# Patient Record
Sex: Female | Born: 1980 | Race: White | Hispanic: No | Marital: Married | Smoking: Current every day smoker
Health system: Southern US, Community
[De-identification: ages and names within clinical notes are randomized; demographics above are authoritative.]

## PROBLEM LIST (undated history)

## (undated) ENCOUNTER — Inpatient Hospital Stay (HOSPITAL_COMMUNITY): Payer: Self-pay

## (undated) DIAGNOSIS — F32A Depression, unspecified: Secondary | ICD-10-CM

## (undated) DIAGNOSIS — G43909 Migraine, unspecified, not intractable, without status migrainosus: Secondary | ICD-10-CM

## (undated) DIAGNOSIS — M5430 Sciatica, unspecified side: Secondary | ICD-10-CM

## (undated) DIAGNOSIS — R200 Anesthesia of skin: Secondary | ICD-10-CM

## (undated) DIAGNOSIS — L989 Disorder of the skin and subcutaneous tissue, unspecified: Secondary | ICD-10-CM

## (undated) DIAGNOSIS — Z8489 Family history of other specified conditions: Secondary | ICD-10-CM

## (undated) DIAGNOSIS — N840 Polyp of corpus uteri: Secondary | ICD-10-CM

## (undated) DIAGNOSIS — K219 Gastro-esophageal reflux disease without esophagitis: Secondary | ICD-10-CM

## (undated) DIAGNOSIS — F329 Major depressive disorder, single episode, unspecified: Secondary | ICD-10-CM

## (undated) HISTORY — DX: Major depressive disorder, single episode, unspecified: F32.9

## (undated) HISTORY — DX: Depression, unspecified: F32.A

---
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
## 2002-01-24 ENCOUNTER — Encounter: Payer: Self-pay | Admitting: *Deleted

## 2002-01-24 ENCOUNTER — Emergency Department (HOSPITAL_COMMUNITY): Admission: EM | Admit: 2002-01-24 | Discharge: 2002-01-24 | Payer: Self-pay | Admitting: *Deleted

## 2002-10-22 ENCOUNTER — Emergency Department (HOSPITAL_COMMUNITY): Admission: EM | Admit: 2002-10-22 | Discharge: 2002-10-23 | Payer: Self-pay | Admitting: Emergency Medicine

## 2002-10-23 ENCOUNTER — Encounter: Payer: Self-pay | Admitting: Emergency Medicine

## 2002-12-23 ENCOUNTER — Inpatient Hospital Stay (HOSPITAL_COMMUNITY): Admission: AD | Admit: 2002-12-23 | Discharge: 2002-12-23 | Payer: Self-pay | Admitting: Obstetrics and Gynecology

## 2003-01-09 ENCOUNTER — Encounter: Admission: RE | Admit: 2003-01-09 | Discharge: 2003-01-09 | Payer: Self-pay | Admitting: Family Medicine

## 2003-04-30 ENCOUNTER — Emergency Department (HOSPITAL_COMMUNITY): Admission: EM | Admit: 2003-04-30 | Discharge: 2003-04-30 | Payer: Self-pay | Admitting: Family Medicine

## 2003-05-01 ENCOUNTER — Emergency Department (HOSPITAL_COMMUNITY): Admission: EM | Admit: 2003-05-01 | Discharge: 2003-05-02 | Payer: Self-pay | Admitting: Emergency Medicine

## 2003-06-10 ENCOUNTER — Encounter: Admission: RE | Admit: 2003-06-10 | Discharge: 2003-06-10 | Payer: Self-pay | Admitting: Obstetrics and Gynecology

## 2003-06-12 ENCOUNTER — Emergency Department (HOSPITAL_COMMUNITY): Admission: EM | Admit: 2003-06-12 | Discharge: 2003-06-12 | Payer: Self-pay | Admitting: Family Medicine

## 2003-07-21 ENCOUNTER — Inpatient Hospital Stay (HOSPITAL_COMMUNITY): Admission: AD | Admit: 2003-07-21 | Discharge: 2003-07-21 | Payer: Self-pay | Admitting: Obstetrics & Gynecology

## 2003-07-24 ENCOUNTER — Inpatient Hospital Stay (HOSPITAL_COMMUNITY): Admission: AD | Admit: 2003-07-24 | Discharge: 2003-07-25 | Payer: Self-pay | Admitting: Obstetrics & Gynecology

## 2003-07-29 ENCOUNTER — Encounter: Admission: RE | Admit: 2003-07-29 | Discharge: 2003-07-29 | Payer: Self-pay | Admitting: Obstetrics and Gynecology

## 2003-12-08 ENCOUNTER — Emergency Department (HOSPITAL_COMMUNITY): Admission: EM | Admit: 2003-12-08 | Discharge: 2003-12-08 | Payer: Self-pay | Admitting: Emergency Medicine

## 2003-12-30 ENCOUNTER — Emergency Department (HOSPITAL_COMMUNITY): Admission: EM | Admit: 2003-12-30 | Discharge: 2003-12-30 | Payer: Self-pay | Admitting: Emergency Medicine

## 2004-03-10 ENCOUNTER — Emergency Department (HOSPITAL_COMMUNITY): Admission: EM | Admit: 2004-03-10 | Discharge: 2004-03-11 | Payer: Self-pay | Admitting: Emergency Medicine

## 2004-04-05 ENCOUNTER — Inpatient Hospital Stay (HOSPITAL_COMMUNITY): Admission: AD | Admit: 2004-04-05 | Discharge: 2004-04-05 | Payer: Self-pay | Admitting: Obstetrics & Gynecology

## 2004-04-15 ENCOUNTER — Ambulatory Visit: Payer: Self-pay | Admitting: Obstetrics and Gynecology

## 2004-04-29 ENCOUNTER — Ambulatory Visit: Payer: Self-pay | Admitting: Family Medicine

## 2004-04-29 ENCOUNTER — Encounter: Payer: Self-pay | Admitting: Family Medicine

## 2004-09-21 ENCOUNTER — Emergency Department (HOSPITAL_COMMUNITY): Admission: EM | Admit: 2004-09-21 | Discharge: 2004-09-22 | Payer: Self-pay | Admitting: Emergency Medicine

## 2005-02-01 ENCOUNTER — Emergency Department (HOSPITAL_COMMUNITY): Admission: EM | Admit: 2005-02-01 | Discharge: 2005-02-01 | Payer: Self-pay | Admitting: Family Medicine

## 2005-02-03 ENCOUNTER — Ambulatory Visit: Payer: Self-pay | Admitting: Gastroenterology

## 2005-02-07 ENCOUNTER — Ambulatory Visit: Payer: Self-pay | Admitting: Gastroenterology

## 2005-03-01 ENCOUNTER — Emergency Department (HOSPITAL_COMMUNITY): Admission: EM | Admit: 2005-03-01 | Discharge: 2005-03-01 | Payer: Self-pay | Admitting: Family Medicine

## 2005-04-26 ENCOUNTER — Emergency Department (HOSPITAL_COMMUNITY): Admission: EM | Admit: 2005-04-26 | Discharge: 2005-04-26 | Payer: Self-pay | Admitting: Family Medicine

## 2005-04-27 ENCOUNTER — Ambulatory Visit: Payer: Self-pay | Admitting: Obstetrics and Gynecology

## 2005-04-27 ENCOUNTER — Encounter: Payer: Self-pay | Admitting: Obstetrics and Gynecology

## 2005-06-10 ENCOUNTER — Emergency Department (HOSPITAL_COMMUNITY): Admission: EM | Admit: 2005-06-10 | Discharge: 2005-06-10 | Payer: Self-pay | Admitting: Family Medicine

## 2005-06-30 ENCOUNTER — Inpatient Hospital Stay (HOSPITAL_COMMUNITY): Admission: AD | Admit: 2005-06-30 | Discharge: 2005-06-30 | Payer: Self-pay | Admitting: Obstetrics and Gynecology

## 2005-10-25 ENCOUNTER — Emergency Department (HOSPITAL_COMMUNITY): Admission: EM | Admit: 2005-10-25 | Discharge: 2005-10-25 | Payer: Self-pay | Admitting: Family Medicine

## 2005-11-23 ENCOUNTER — Emergency Department (HOSPITAL_COMMUNITY): Admission: EM | Admit: 2005-11-23 | Discharge: 2005-11-23 | Payer: Self-pay | Admitting: Emergency Medicine

## 2006-03-10 ENCOUNTER — Emergency Department (HOSPITAL_COMMUNITY): Admission: EM | Admit: 2006-03-10 | Discharge: 2006-03-10 | Payer: Self-pay | Admitting: Emergency Medicine

## 2006-07-21 ENCOUNTER — Ambulatory Visit (HOSPITAL_COMMUNITY): Admission: RE | Admit: 2006-07-21 | Discharge: 2006-07-21 | Payer: Self-pay | Admitting: Obstetrics & Gynecology

## 2006-08-09 ENCOUNTER — Ambulatory Visit: Payer: Self-pay | Admitting: *Deleted

## 2006-08-09 ENCOUNTER — Encounter: Payer: Self-pay | Admitting: Obstetrics and Gynecology

## 2006-08-14 ENCOUNTER — Emergency Department (HOSPITAL_COMMUNITY): Admission: EM | Admit: 2006-08-14 | Discharge: 2006-08-14 | Payer: Self-pay | Admitting: Emergency Medicine

## 2006-08-31 ENCOUNTER — Ambulatory Visit: Payer: Self-pay | Admitting: Gynecology

## 2006-09-02 ENCOUNTER — Inpatient Hospital Stay (HOSPITAL_COMMUNITY): Admission: AD | Admit: 2006-09-02 | Discharge: 2006-09-02 | Payer: Self-pay | Admitting: Gynecology

## 2006-09-06 ENCOUNTER — Ambulatory Visit: Payer: Self-pay | Admitting: Gynecology

## 2007-01-13 ENCOUNTER — Emergency Department (HOSPITAL_COMMUNITY): Admission: EM | Admit: 2007-01-13 | Discharge: 2007-01-13 | Payer: Self-pay | Admitting: Family Medicine

## 2007-03-07 ENCOUNTER — Emergency Department (HOSPITAL_COMMUNITY): Admission: EM | Admit: 2007-03-07 | Discharge: 2007-03-07 | Payer: Self-pay | Admitting: Emergency Medicine

## 2007-05-16 ENCOUNTER — Ambulatory Visit: Payer: Self-pay | Admitting: Obstetrics & Gynecology

## 2007-05-18 ENCOUNTER — Ambulatory Visit (HOSPITAL_COMMUNITY): Admission: RE | Admit: 2007-05-18 | Discharge: 2007-05-18 | Payer: Self-pay | Admitting: Family Medicine

## 2007-08-01 ENCOUNTER — Inpatient Hospital Stay (HOSPITAL_COMMUNITY): Admission: AD | Admit: 2007-08-01 | Discharge: 2007-08-01 | Payer: Self-pay | Admitting: Obstetrics and Gynecology

## 2007-08-06 ENCOUNTER — Inpatient Hospital Stay (HOSPITAL_COMMUNITY): Admission: RE | Admit: 2007-08-06 | Discharge: 2007-08-08 | Payer: Self-pay | Admitting: Obstetrics & Gynecology

## 2007-08-06 ENCOUNTER — Encounter: Payer: Self-pay | Admitting: Obstetrics & Gynecology

## 2007-08-06 ENCOUNTER — Ambulatory Visit: Payer: Self-pay | Admitting: Obstetrics & Gynecology

## 2007-08-06 HISTORY — PX: MYOMECTOMY ABDOMINAL APPROACH: SUR870

## 2007-08-29 ENCOUNTER — Ambulatory Visit: Payer: Self-pay | Admitting: Obstetrics & Gynecology

## 2007-09-18 ENCOUNTER — Emergency Department (HOSPITAL_COMMUNITY): Admission: EM | Admit: 2007-09-18 | Discharge: 2007-09-18 | Payer: Self-pay | Admitting: Emergency Medicine

## 2007-10-22 ENCOUNTER — Emergency Department (HOSPITAL_BASED_OUTPATIENT_CLINIC_OR_DEPARTMENT_OTHER): Admission: EM | Admit: 2007-10-22 | Discharge: 2007-10-22 | Payer: Self-pay | Admitting: Emergency Medicine

## 2007-12-03 ENCOUNTER — Emergency Department (HOSPITAL_BASED_OUTPATIENT_CLINIC_OR_DEPARTMENT_OTHER): Admission: EM | Admit: 2007-12-03 | Discharge: 2007-12-03 | Payer: Self-pay | Admitting: Emergency Medicine

## 2008-01-17 ENCOUNTER — Inpatient Hospital Stay (HOSPITAL_COMMUNITY): Admission: AD | Admit: 2008-01-17 | Discharge: 2008-01-17 | Payer: Self-pay | Admitting: Family Medicine

## 2008-03-11 ENCOUNTER — Emergency Department (HOSPITAL_COMMUNITY): Admission: EM | Admit: 2008-03-11 | Discharge: 2008-03-11 | Payer: Self-pay | Admitting: Family Medicine

## 2008-05-05 ENCOUNTER — Emergency Department (HOSPITAL_COMMUNITY): Admission: EM | Admit: 2008-05-05 | Discharge: 2008-05-05 | Payer: Self-pay | Admitting: Family Medicine

## 2008-06-30 ENCOUNTER — Emergency Department (HOSPITAL_COMMUNITY): Admission: EM | Admit: 2008-06-30 | Discharge: 2008-06-30 | Payer: Self-pay | Admitting: Emergency Medicine

## 2008-08-20 ENCOUNTER — Encounter: Payer: Self-pay | Admitting: Obstetrics & Gynecology

## 2008-08-20 ENCOUNTER — Ambulatory Visit: Payer: Self-pay | Admitting: Obstetrics & Gynecology

## 2008-11-28 ENCOUNTER — Emergency Department (HOSPITAL_COMMUNITY): Admission: EM | Admit: 2008-11-28 | Discharge: 2008-11-29 | Payer: Self-pay | Admitting: Emergency Medicine

## 2009-09-23 ENCOUNTER — Inpatient Hospital Stay (HOSPITAL_COMMUNITY): Admission: AD | Admit: 2009-09-23 | Discharge: 2009-09-23 | Payer: Self-pay | Admitting: Obstetrics & Gynecology

## 2009-10-07 ENCOUNTER — Ambulatory Visit: Payer: Self-pay | Admitting: Obstetrics and Gynecology

## 2009-10-23 ENCOUNTER — Ambulatory Visit: Payer: Self-pay | Admitting: Family Medicine

## 2009-12-11 ENCOUNTER — Ambulatory Visit: Payer: Self-pay | Admitting: Obstetrics & Gynecology

## 2010-01-12 IMAGING — CR DG CHEST 2V
2 series · 2 of 2 positions shown · non-contrast
Comparison: Chest x-ray of 12/30/2003

CLINICAL DATA: Mid chest pain, nausea and vomiting

CHEST - 2 VIEW

[w chest pa]
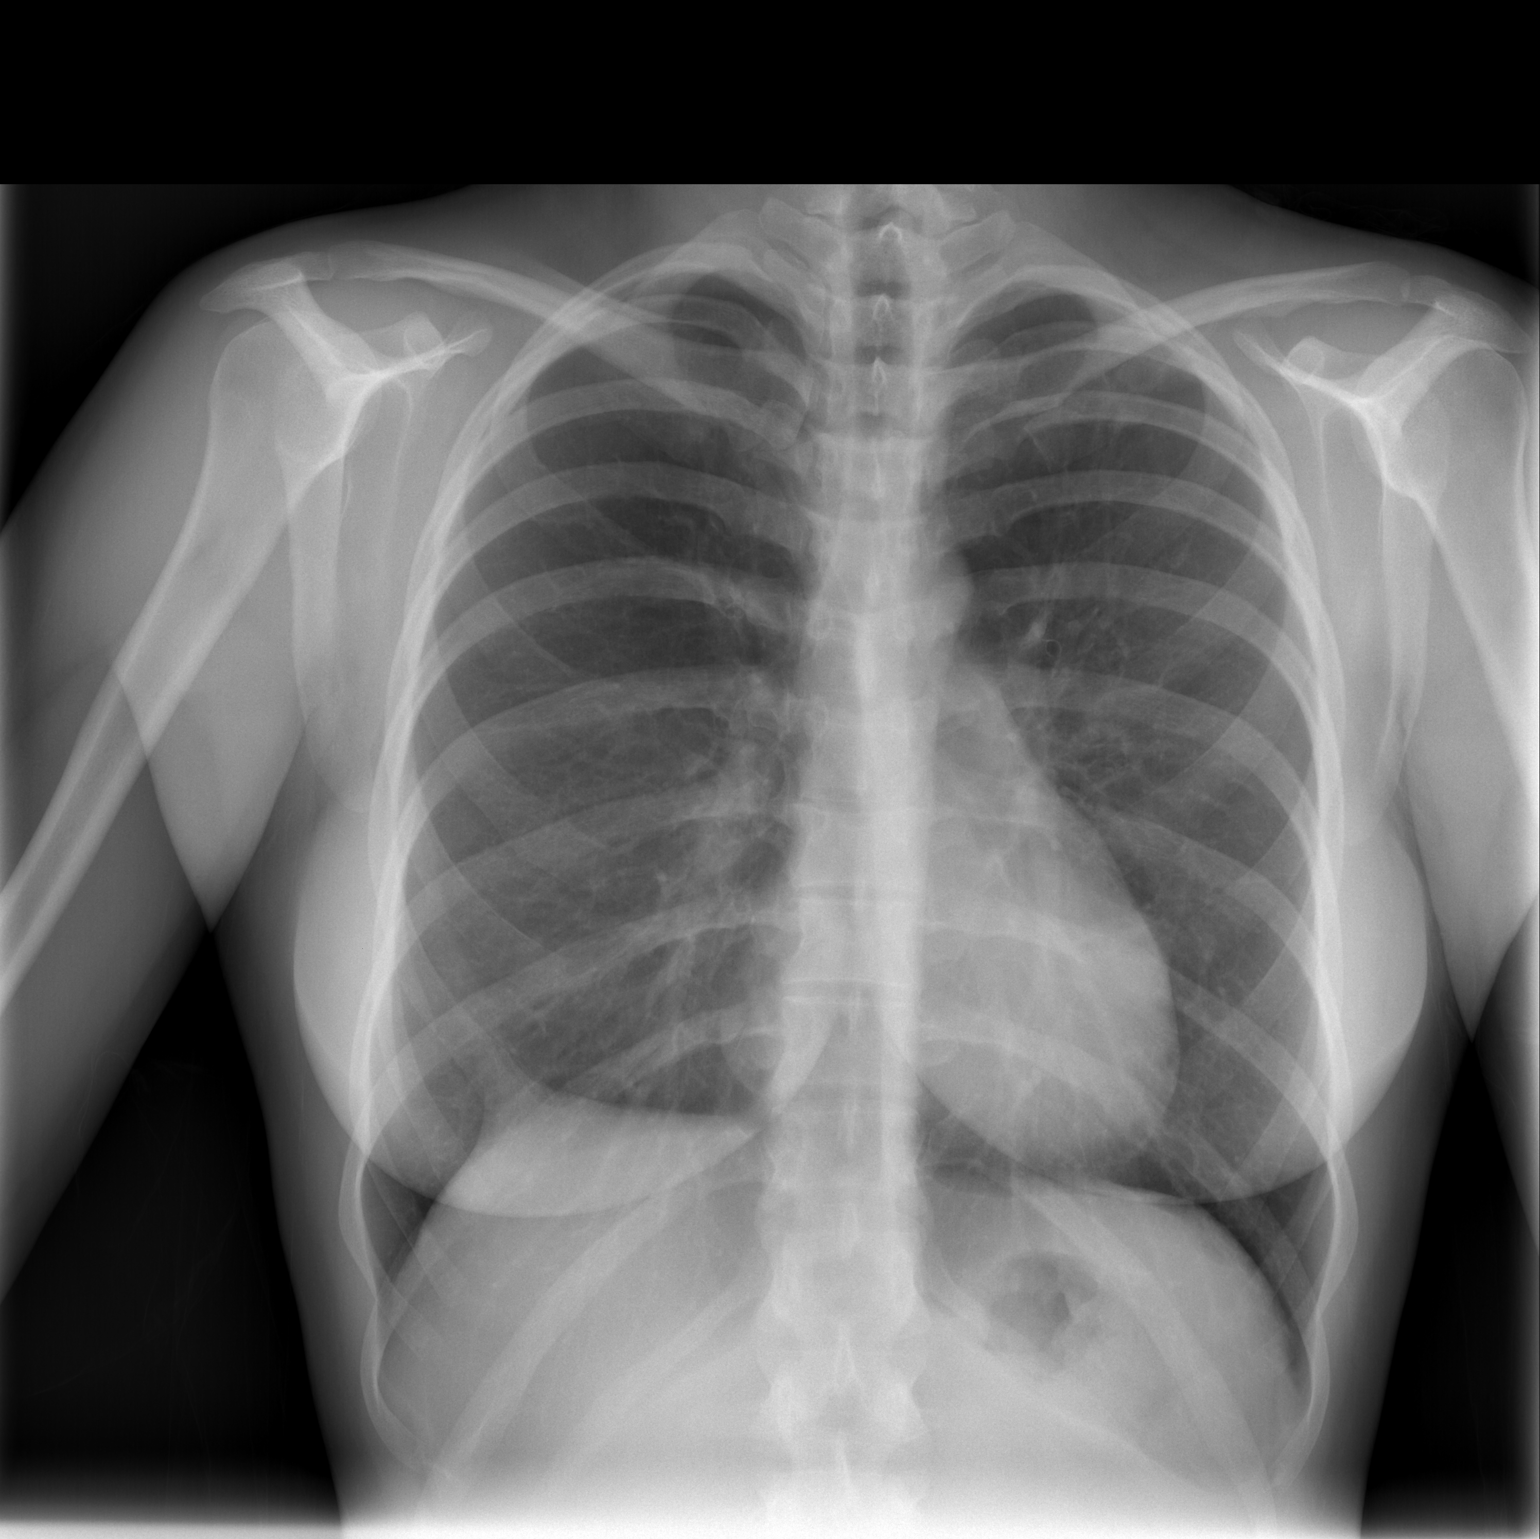

[w chest lat]
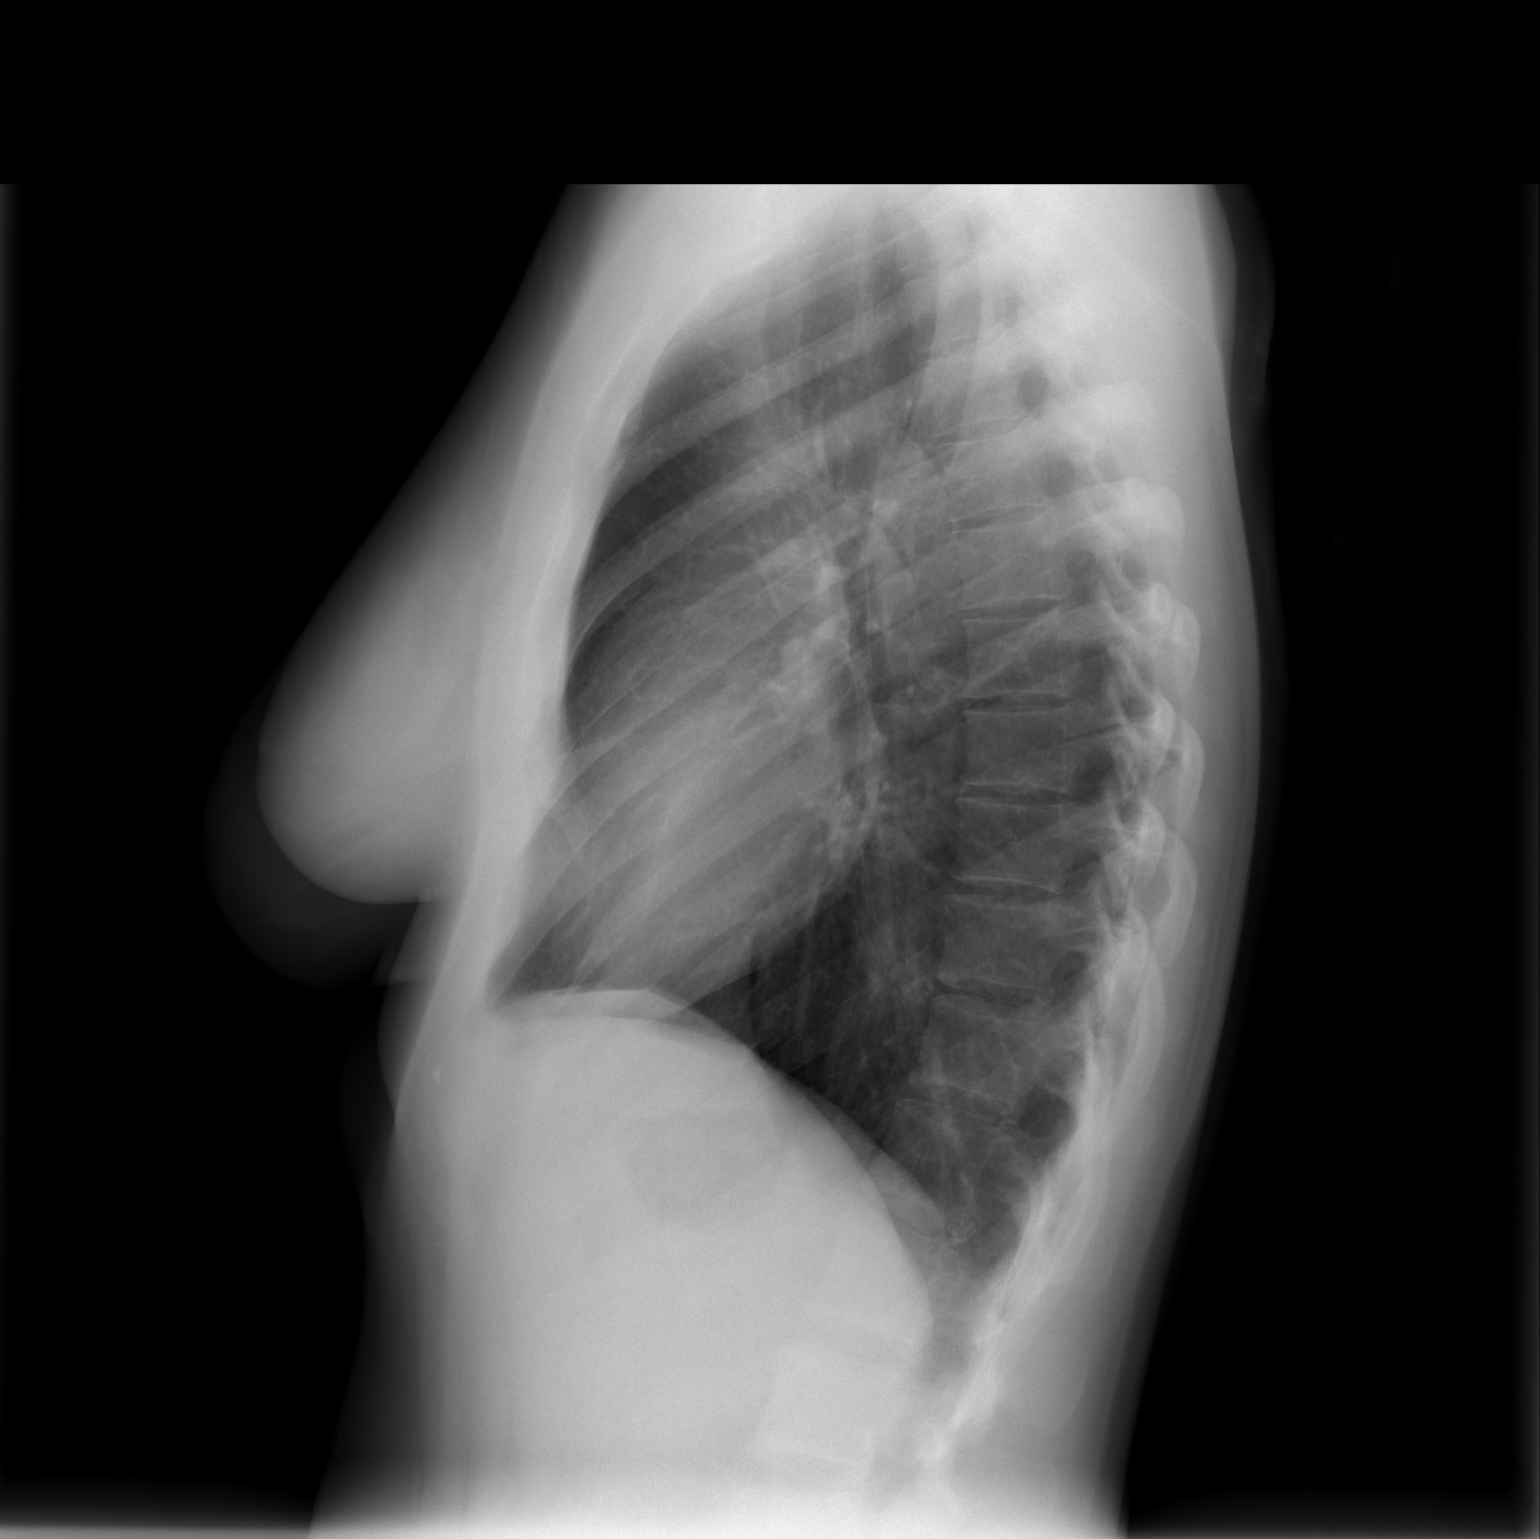

[2 of 2 positions shown; findings below may reference images not displayed]

FINDINGS: The lungs are clear.  There does appear to be mild
scarring at the right lung base with pleural - diaphragmatic
tenting. The heart is within normal limits in size. No bony
abnormality is seen.
IMPRESSION: No active lung disease.

## 2010-01-27 ENCOUNTER — Ambulatory Visit
Admission: RE | Admit: 2010-01-27 | Discharge: 2010-01-27 | Payer: Self-pay | Source: Home / Self Care | Attending: Obstetrics and Gynecology | Admitting: Obstetrics and Gynecology

## 2010-03-03 NOTE — Group Therapy Note (Signed)
NAME:  Donna Gordon, Donna Gordon NO.:  [id number]  MEDICAL RECORD NO.:  [id number]           PATIENT TYPE:  LOCATION:  [facility]                     FACILITY:  PHYSICIAN:  Argentina Donovan, MD        DATE OF BIRTH:  [birth year]  DATE OF SERVICE:  01/27/2010                                 CLINIC NOTE  The patient is a 30 year old nulligravida with a Mirena IUD placed on October 23, 2009, for severe dysmenorrhea.  She had tried everything before that and had problems.  Now that seems to be working, she is very comfortable from that point of view, but is having problems with weight gain.  She has gained 5 pounds, which is a lot for this young woman 5 feet 6 inches and weighs 142.6 pounds and recently, she feels breast engorgement and bloating.  I have told her to ride it out another of couple months and see whether or not that clears up.  If it does not, I give her spirolactone 50 mg b.i.d. to use 10 days at a time, 10 days on and 10 days off so that potassium does not get up too high.  In addition to this, no matter what she eats, she gets bloating immediately and feels like she is pregnant, looks like she is pregnant grandmother says. She most probably has IBS symptoms and I am going to try her on Amitiza at 8 mg b.i.d. and see if that does help.  She has no insurance, I gave her some samples of that as she falls into a category that is very difficult to be able to tolerate.  In any case, she seems to be doing fairly well otherwise.  She was having real mood swings since she has been on the IUD but some doctor gave her Prozac and that seems to have leveled her out considerably.  IMPRESSION:  Probable irritable bowel syndrome, weight gain, and engorgement secondary to Mirena intrauterine device.          ______________________________ Argentina Donovan, MD    PR/MEDQ  D:  01/27/2010  T:  01/28/2010  Job:  [id number]

## 2010-03-24 LAB — POCT PREGNANCY, URINE: Preg Test, Ur: NEGATIVE

## 2010-03-25 LAB — URINALYSIS, ROUTINE W REFLEX MICROSCOPIC
Bilirubin Urine: NEGATIVE
Glucose, UA: NEGATIVE mg/dL
Ketones, ur: NEGATIVE mg/dL
Leukocytes, UA: NEGATIVE
Nitrite: NEGATIVE
Protein, ur: NEGATIVE mg/dL
Specific Gravity, Urine: 1.02 (ref 1.005–1.030)
Urobilinogen, UA: 0.2 mg/dL (ref 0.0–1.0)
pH: 6 (ref 5.0–8.0)

## 2010-03-25 LAB — WET PREP, GENITAL
Clue Cells Wet Prep HPF POC: NONE SEEN
Trich, Wet Prep: NONE SEEN
Yeast Wet Prep HPF POC: NONE SEEN

## 2010-03-25 LAB — DIFFERENTIAL
Basophils Absolute: 0 10*3/uL (ref 0.0–0.1)
Basophils Relative: 0 % (ref 0–1)
Eosinophils Absolute: 0.1 10*3/uL (ref 0.0–0.7)
Eosinophils Relative: 1 % (ref 0–5)
Lymphocytes Relative: 26 % (ref 12–46)
Lymphs Abs: 2.5 10*3/uL (ref 0.7–4.0)
Monocytes Absolute: 0.6 10*3/uL (ref 0.1–1.0)
Monocytes Relative: 6 % (ref 3–12)
Neutro Abs: 6.5 10*3/uL (ref 1.7–7.7)
Neutrophils Relative %: 67 % (ref 43–77)

## 2010-03-25 LAB — GC/CHLAMYDIA PROBE AMP, GENITAL
Chlamydia, DNA Probe: NEGATIVE
GC Probe Amp, Genital: NEGATIVE

## 2010-03-25 LAB — URINE MICROSCOPIC-ADD ON

## 2010-03-25 LAB — CBC
HCT: 37.5 % (ref 36.0–46.0)
Hemoglobin: 12.6 g/dL (ref 12.0–15.0)
MCH: 31.5 pg (ref 26.0–34.0)
MCHC: 33.5 g/dL (ref 30.0–36.0)
MCV: 93.9 fL (ref 78.0–100.0)
Platelets: 237 10*3/uL (ref 150–400)
RBC: 4 MIL/uL (ref 3.87–5.11)
RDW: 12.8 % (ref 11.5–15.5)
WBC: 9.7 10*3/uL (ref 4.0–10.5)

## 2010-03-25 LAB — POCT PREGNANCY, URINE: Preg Test, Ur: NEGATIVE

## 2010-03-29 ENCOUNTER — Ambulatory Visit (INDEPENDENT_AMBULATORY_CARE_PROVIDER_SITE_OTHER): Payer: Self-pay | Admitting: Obstetrics & Gynecology

## 2010-03-29 DIAGNOSIS — K589 Irritable bowel syndrome without diarrhea: Secondary | ICD-10-CM

## 2010-03-29 DIAGNOSIS — Z30431 Encounter for routine checking of intrauterine contraceptive device: Secondary | ICD-10-CM

## 2010-04-02 NOTE — Progress Notes (Signed)
NAME:  KENNY, STERN NO.:  [id number]  MEDICAL RECORD NO.:  [id number]           PATIENT TYPE:  A  LOCATION:  [facility]                   FACILITY:  [facility]  PHYSICIAN:  Jaynie Collins, MD     DATE OF BIRTH:  1980-10-26  DATE OF SERVICE:                                 CLINIC NOTE  The patient is a 30 year old nulligravida with a Mirena IUD that was placed in October 23, 2009, that was seen by Dr. Okey Dupre in January for a complaint of having weight gain.  At that point, she weighed 142 pounds. She also reported having breast engorgement, which she was told was normal expected side effects.  At the same time, she reports having irritable bowel syndrome 10 times and was given a sample of Amitiza 8 mcg twice a day.  She does say that this helped tremendously and she wanted to know if there were more samples to be given to her to help her symptoms.  As for her IUD, she denies any other concerns at this point. Today's the patient weighs 145 pounds.  She is okay with this.  She was told that the progesterone could increase her appetite and so she should be cognizant of this and watch what she is eating and also exercise more if she wants to avoid any further weight gain.  As for the Amitiza, no further samples are available at this point.  A prescription of it was offered to her and also referral for a gastroenterologist but she declined this at this point.  She was told if her symptoms worsen that she should let us know, we will initiate a referral for a gastroenterologist.  The patient's last Pap smear was in September 2011 and she will come back then for her next evaluation.          ______________________________ Jaynie Collins, MD    UA/MEDQ  D:  03/29/2010  T:  03/30/2010  Job:  [id number]

## 2010-04-14 LAB — URINALYSIS, ROUTINE W REFLEX MICROSCOPIC
Glucose, UA: NEGATIVE mg/dL
Ketones, ur: NEGATIVE mg/dL
Leukocytes, UA: NEGATIVE
pH: 5.5 (ref 5.0–8.0)

## 2010-04-14 LAB — CBC
HCT: 36.4 % (ref 36.0–46.0)
Platelets: 258 10*3/uL (ref 150–400)
RBC: 4.01 MIL/uL (ref 3.87–5.11)
WBC: 9.5 10*3/uL (ref 4.0–10.5)

## 2010-04-14 LAB — POCT PREGNANCY, URINE: Preg Test, Ur: NEGATIVE

## 2010-04-14 LAB — DIFFERENTIAL
Eosinophils Relative: 1 % (ref 0–5)
Lymphocytes Relative: 33 % (ref 12–46)
Lymphs Abs: 3.1 10*3/uL (ref 0.7–4.0)
Monocytes Relative: 6 % (ref 3–12)

## 2010-04-14 LAB — URINE MICROSCOPIC-ADD ON

## 2010-04-19 LAB — RAPID STREP SCREEN (MED CTR MEBANE ONLY): Streptococcus, Group A Screen (Direct): NEGATIVE

## 2010-04-22 LAB — POCT I-STAT, CHEM 8
Calcium, Ion: 1.24 mmol/L (ref 1.12–1.32)
Chloride: 104 mEq/L (ref 96–112)
HCT: 45 % (ref 36.0–46.0)
Potassium: 3.5 mEq/L (ref 3.5–5.1)

## 2010-04-22 LAB — CBC
MCHC: 34.4 g/dL (ref 30.0–36.0)
RDW: 13.4 % (ref 11.5–15.5)

## 2010-04-22 LAB — POCT URINALYSIS DIP (DEVICE)
Bilirubin Urine: NEGATIVE
Glucose, UA: NEGATIVE mg/dL
Nitrite: NEGATIVE
pH: 7 (ref 5.0–8.0)

## 2010-04-22 LAB — DIFFERENTIAL
Basophils Absolute: 0 10*3/uL (ref 0.0–0.1)
Basophils Relative: 0 % (ref 0–1)
Neutro Abs: 5.9 10*3/uL (ref 1.7–7.7)
Neutrophils Relative %: 60 % (ref 43–77)

## 2010-04-26 LAB — URINALYSIS, ROUTINE W REFLEX MICROSCOPIC
Glucose, UA: NEGATIVE mg/dL
Leukocytes, UA: NEGATIVE
Nitrite: NEGATIVE
Protein, ur: NEGATIVE mg/dL
Urobilinogen, UA: 0.2 mg/dL (ref 0.0–1.0)

## 2010-04-26 LAB — SAMPLE TO BLOOD BANK

## 2010-04-26 LAB — URINE MICROSCOPIC-ADD ON

## 2010-04-26 LAB — CBC
HCT: 40.5 % (ref 36.0–46.0)
Platelets: 263 10*3/uL (ref 150–400)
WBC: 9.9 10*3/uL (ref 4.0–10.5)

## 2010-04-26 LAB — POCT PREGNANCY, URINE: Preg Test, Ur: NEGATIVE

## 2010-05-25 NOTE — Op Note (Signed)
NAME:  Donna Gordon, APSEY NO.:  [id number]   MEDICAL RECORD NO.:  [id number]          PATIENT TYPE:  INP   LOCATION:  9318                          [facility]   PHYSICIAN:  Norton Blizzard, MD    DATE OF BIRTH:  16-Jul-1980   DATE OF PROCEDURE:  08/06/2007  DATE OF DISCHARGE:                               OPERATIVE REPORT   PREOPERATIVE DIAGNOSES:  Symptomatic fibroid uterus, chronic pelvic  pain.   POSTOPERATIVE DIAGNOSES:  Symptomatic fibroid uterus, chronic pelvic  pain.   PROCEDURE:  Abdominal myomectomy.   SURGEON:  Norton Blizzard, MD   ASSISTANT:  Javier Glazier. Rose, MD   ANESTHESIA:  General.   IV FLUIDS:  700 mL of lactated Ringers.   ESTIMATED BLOOD LOSS:  200 mL.   URINE OUTPUT:  250 mL.   INDICATIONS:  The patient is a 30 year old G0 with long history of  chronic pelvic pain and dyspareunia and had been followed in the clinic  since 2007 for this pain.  A recent ultrasound showed a retroverted  uterus with a 4-cm fibroid projecting from the anterior uterine fundus.  The patient opted for surgical management.  Prior to surgery, the risks  of surgery were explained to the patient in detail including the  possible risk of need for cesarean sections in further deliveries, and  informed consent was obtained.  It was also emphasized that the fibroid  may not be cause of her pain, and that her pain could persist after the  surgery.   FINDINGS:  Small retroverted uterus with a 4-cm subserosal fibroid  lesion in the fundus of the uterus, with the depth involving about 70%  of the myometrium. Fibroid lesion was noted not to have clear  planes/capsule for dissection, and was noted to have a small amount of  chocolate-brown thick liquid in the middle of the lesion.  Endometrium  was not encountered.  Normal uterus, ovaries, and tubes bilaterally.  No  endometriotic lesions or any other reasons for the patient's pain noted.   SPECIMENS:  Fibroid lesion  which was sent to pathology.   COMPLICATIONS:  None.   PROCEDURE DETAILS:  The patient was taken to the operating room where  general anesthesia was administered and found to be adequate.  She was  then placed in the dorsal lithotomy position, and prepped and draped in  usual sterile manner.  A Pfannenstiel incision was made with a scalpel  and carried through to the underlying layer of fascia with cautery.  The  fascia was incised in midline and this incision was extended bilaterally  using Mayo scissors.  Kochers were applied to the superior aspect of the  incision, and the rectus muscles were dissected off bluntly and sharply.  A similar process was carried out on the inferior aspect of the  incision.  The peritoneum was entered bluntly and this incision was  extended bilaterally with good visualization of bowel and bladder.  At  this time, attention was then turned to the patient's pelvis.  The  uterus was delivered up, and a 4-cm fibroid was noted to  be on the top  of the uterus of the fundus.  No other fibroids were noted.  The ovaries  and tubes were examined and found to be normal.  Vasopressin was then  injected into the base of the fibroid and also on top of the fibroid.  Incision was made on top of the fibroid, and the fibroid was noted not  to have not a clear capsule and was not able to be shelled out smoothly.  Using sharp dissection, the fibroid was able to be removed in its  entirety.  The excess tissue was trimmed and the defect was closed using  0 Vicryl to reapproximate the myometrium and 2-0 Vicryl to reapproximate  the serosa in a baseball stitch.  Of note, the endometrium was not  encountered but given the position of the fibroid and how deep it was  into the myometrium, it was recommended the patient has to undergo  cesarean section with any subsequent pregnancies to reduce the risk of  uterine rupture.  Overall, good hemostasis was noted.  An Interceed  sheet was  then placed as an adhesion barrier over the uterine incision.  The uterus was returned to the abdomen.  The pelvis was examined and no  sign of endometriosis was noted.  The gutters were cleaned using a moist  laparotomy sponge.  The peritoneum was reapproximated using 0 Vicryl.  The fascia was reapproximated also using 0 Vicryl in a running stitch.  The subcutaneous layer was reapproximated using 2-0 plain gut, and the  skin was closed with a 4-0 Vicryl subcuticular stitch.  The patient  tolerated the procedure well.  Sponge, instrument, and needle counts  were correct x2.  She was taken to the recovery room, extubated, awake  and in stable condition.      Norton Blizzard, MD  Electronically Signed     UAD/MEDQ  D:  08/06/2007  T:  08/07/2007  Job:  [id number]

## 2010-05-25 NOTE — Discharge Summary (Signed)
NAMEVERNEL, Donna Gordon NO.:  [id number]   MEDICAL RECORD NO.:  [id number]          PATIENT TYPE:  INP   LOCATION:  9318                          [facility]   PHYSICIAN:  Norton Blizzard, MD    DATE OF BIRTH:  1980-06-12   DATE OF ADMISSION:  08/06/2007  DATE OF DISCHARGE:  08/08/2007                               DISCHARGE SUMMARY   ADMISSION DIAGNOSIS:  Symptomatic fibroid uterus, chronic pelvic pain.   PROCEDURES:  Abdominal myomectomy on August 06, 2007.   BRIEF HOSPITAL COURSE:  The patient is a 30 year old G0 who underwent an  abdominal myomectomy on August 06, 2007.  The patient had an uncomplicated  postoperative course.  Her postoperative hemoglobin was 11.8.  By  postoperative day #2, the patient's pain was controlled on oral pain  medications.  She was tolerating regular diet, ambulating, and passing  flatus.  The patient was discharged to home in stable condition.   DISCHARGE MEDICATIONS:  1. Percocet 5/325 one to two tablets p.o. every 6 hours p.r.n. pain.  2. Ibuprofen 600 mg p.o. every 6 hours p.r.n. pain.  3. Colace 100 mg p.o. b.i.d. p.r.n. constipation.   DISCHARGE INSTRUCTIONS:  Patient  was instructed to avoid lifting  anything heavier than 10 pounds for the next 6 weeks.  She was told to  avoid sexual activity for 2 weeks and to increase her activity slowly.  Given the position of the fibroid on her fundus and the extent of the  invasion into the myometrium, the patient was counseled that she will  need cesarean sections in any subsequent pregnancies given the increased  risk of uterine rupture.  Patient did verbalize understanding of plan.  The patient will follow up in the GYN Clinic on August 29, 2007, at 1:30  p.m. for her postoperative check.      Norton Blizzard, MD  Electronically Signed     UAD/MEDQ  D:  08/08/2007  T:  08/08/2007  Job:  [id number]

## 2010-05-25 NOTE — Group Therapy Note (Signed)
NAME:  MISA, FEDORKO NO.:  [id number]   MEDICAL RECORD NO.:  [id number]          PATIENT TYPE:  WOC   LOCATION:  [facility]                   FACILITY:  [facility]   PHYSICIAN:  Allie Bossier, MD        DATE OF BIRTH:  October 08, 1980   DATE OF SERVICE:  08/20/2008                                  CLINIC NOTE   Donna Gordon is a 30 year old single white gravida 0, who comes in here  today for an annual exam and wishes to restart her Yaz birth control  pills.  She was a previous user and happy with it, but she has not used  it lately.   PAST MEDICAL HISTORY:  Asthma and she is a smoker.  She also has uterine  fibroids.   PAST SURGICAL HISTORY:  She had a laparotomy and a myomectomy in 2009.  She had oral surgery.   FAMILY HISTORY:  Positive for breast cancer in maternal grandmother and  pancreatic cancer in paternal aunt, but she denies family history of GYN  and colon malignancies.   SOCIAL HISTORY:  She smokes one-half to one pack a day for the last 6  years.  She drinks alcohol rarely and denies drugs.   ALLERGIES:  No to LATEX allergies.  No known drug allergies.   REVIEW OF SYSTEMS:  She is currently unemployed and not dating anybody  for the last month.  Her last Pap smear was in 2008 and was normal.   PHYSICAL EXAMINATION:  VITAL SIGNS:  Height 5 feet 6 inches, weight 132,  blood pressure 120/72, pulse 91.  HEENT:  Normal.  HEART:  Regular rate and rhythm.  BREASTS:  Normal bilaterally.  ABDOMEN:  Benign, no hepatosplenomegaly.  PELVIC:  External genitalia, no lesions.  Cervix, very difficult to see  due to her posterior fibroids.  However, I was able to visualize the  cervix which is in a very anterior position, it is nulliparous, and the  cervix is without lesions.  Pap smear was obtained.  BIMANUAL:  A 12-week size uterus with a posterior fibroids that is  probably 6-8 cm in size.  Her adnexa are not palpable and are not  particularly tender.   ASSESSMENT  AND PLAN:  Annual exam.  I have checked a Pap smear and  recommended self-breast and self-vulvar exams.  I have checked GC and  Chlamydia cultures since she has been having unprotected intercourse.  I  recommended she stop smoking.  I have given her prescription for Yaz  that she will start with the first day of her next menstrual period.  If  she notices these backup method for the next month after that.      Allie Bossier, MD     MCD/MEDQ  D:  08/20/2008  T:  08/21/2008  Job:  [id number]

## 2010-05-25 NOTE — Group Therapy Note (Signed)
NAME:  Donna Gordon, Donna Gordon NO.:  [id number]   MEDICAL RECORD NO.:  [id number]          PATIENT TYPE:  WOC   LOCATION:  [facility]                   FACILITY:  [facility]   PHYSICIAN:  Johnella Moloney, MD        DATE OF BIRTH:  04-29-1980   DATE OF SERVICE:  05/16/2007                                  CLINIC NOTE   CHIEF COMPLAINT:  Dyspareunia.   HISTORY OF PRESENT ILLNESS:  The patient is a 30 year old G0 with long  history of dyspareunia.  The patient was seen for the dyspareunia since  2007.  An ultrasound that was done on July 21, 2006 showed retroverted  uterus with a 3.5-cm subserosal fibroid projecting from the anterior  uterine fundus.  The patient's pain has been noted to be mostly  anterior.  The patient reports no pelvic pain outside of intercourse.  At her visit in July 2008, the patient was given the option to have  surgery to help with this problem, but she did not want this at the  time.  At this visit, the patient is interested in pursuing surgical  management for this problem, because she reports that this is  unbearable.  The patient reports no other symptoms.   PAST MEDICAL HISTORY:  Asthma.   PAST SURGICAL HISTORY:  None.   PAST GYNECOLOGIC HISTORY:  The patient reports normal periods.  No  intermenstrual bleeding.  She denies any sexually transmitted diseases,  and she has never been pregnant.  She is currently not using any birth  control pills, not trying to get pregnant, but she would not mind if she  did get pregnant   SOCIAL HISTORY:  The patient smokes two pack packs per day and has been  smoking for 7 years.  She does not drink alcohol.  She denies any  history of use of addictive drugs.  She also denied any current or past  sexual or physical abuse.   FAMILY HISTORY:  Is remarkable for diabetes.   PHYSICAL EXAMINATION:  Temperature 98.6, pulse 101, blood pressure  138/81, weight 137.3 pounds, height 65 inches.  In general, no apparent  distress.  ABDOMEN:  Soft, nontender, nondistended.  No palpable masses were  appreciated.  EXTREMITIES:  No edema, cyanosis, or calf tenderness noted.  On pelvic examination, normal external female genitalia.  Pink well  rugated vagina.  No lesions. Cervix was normal appearing, very anterior  secondary to her retroverted uterus.  There was no cervical motion  tenderness.  A sample for gonorrhea and chlamydia was obtained.  On  bimanual exam and a speculum exam, the patient reported moderate  tenderness, especially anteriorly, and small retroverted uterus was  palpated with the anterior fibroid also palpated.  There was no adnexal  fullness, masses or tenderness noted.   ASSESSMENT/PLAN:  The patient is a 30 year old G0 with a symptomatic  uterine fibroid which causes dyspareunia.  The patient is here for  surgical evaluation and management.  As for her surgical options, the  patient was told that the best option will be abdominal myomectomy given  that she is nulliparous.  The patient  does agree with the plan.  The  risks of surgery were discussed with the patient including bleeding,  infection, injury to surrounding organs, need for additional procedures.  Also formation of adhesions secondary to the surgery.  The patient  verbalized understanding of this and still wants to proceed with  surgery.  At the end of this visit, she will be sent over to the  surgical scheduler to come up with a time and date for her surgery.  In  the meantime, the patient was told to continue pain medications as  needed, and she was told that it was likely that this pain may not be  coming from her anterior fibroid, and this pain may not go away after  surgery.  However, this was the only etiology that has been discovered  so far, and this was going to be to management for it.  During surgery,  detailed survey of her pelvic anatomy will be done.  I just want to rule  out endometriosis or any other sources  of her pain.           ______________________________  Johnella Moloney, MD     UD/MEDQ  D:  05/16/2007  T:  05/16/2007  Job:  [id number]

## 2010-05-25 NOTE — Group Therapy Note (Signed)
NAME:  Donna Gordon, Donna Gordon NO.:  [id number]   MEDICAL RECORD NO.:  [id number]          PATIENT TYPE:  WOC   LOCATION:  [facility]                   FACILITY:  [facility]   PHYSICIAN:  Elsie Lincoln, MD      DATE OF BIRTH:  [birth year]   DATE OF SERVICE:  08/29/2007                                  CLINIC NOTE   CHIEF COMPLAINT:  This is a surgical followup from an abdominal  myomectomy performed by Dr. Silas Flood on August 06, 2007.   HISTORY OF PRESENT ILLNESS:  The patient is a 30 year old G0 with a long  history of dyspareunia, and the patient had a myomectomy on August 06, 2007.  Based on ultrasound that showed a 3.5 cm subserosal fibroid.  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  The patient is  without complaints today.  She has normal bowel and bladder function.  She did have her first period since surgery approximately 3 weeks after  the surgery.  It was a relatively normal period but was heavy bleeding,  and with some cramping marginally improved from previous periods.  She  has normal bowel and bladder function and has no abdominal pain or  tenderness.  She notes some discomfort in the incision, which is  primarily itching and burning especially with movement.  She has been  doing normal activities of daily living  and plans to return to work on  September 11, 2007.   PAST MEDICAL HISTORY:  Significant for asthma.   PAST SURGICAL HISTORY:  Just the myomectomy.   PAST GYN HISTORY:  No history of sexually transmitted diseases.  She is  positive for a Pap smear, but declines on today.   PHYSICAL EXAMINATION:  VITAL SIGNS:  Temperature is 97.5, pulse 88,  blood pressure 115/64, and weight is 137.2 pounds.  GENERAL:  She is alert and oriented in no acute distress.  Comfortable  appearing, talkative and cooperative.  ABDOMEN:  Soft, nontender, and nondistended with normoactive bowel  sounds.   Incision is well healed and intact.  EXTREMITIES:  No edema and are nontender bilaterally.   ASSESSMENT AND PLAN:  This is a 30 year old G0 who is status post  myomectomy abdominal on August 06, 2007, here for postop check.  She was  doing well after the surgery, does not have the improvement in the pain  during her periods that she had hoped for, but is optimistic that this  will improve in the future ones. She is back to normal daily activities  and plans to return to work on September 11, 2007.     ______________________________  Elsie Lincoln, MD    ______________________________  Elsie Lincoln, MD    KL/MEDQ  D:  08/29/2007  T:  08/30/2007  Job:  [id number]

## 2010-05-25 NOTE — Group Therapy Note (Signed)
NAME:  Donna Gordon, Donna Gordon NO.:  [id number]   MEDICAL RECORD NO.:  [id number]          PATIENT TYPE:  WOC   LOCATION:  [facility]                   FACILITY:  [facility]   PHYSICIAN:  Ginger Carne, MD DATE OF BIRTH:  1981/01/04   DATE OF SERVICE:  08/31/2006                                  CLINIC NOTE   REASON FOR VISIT:  The patient is here today complaining of a  nonirritative vaginal discharge described as white and non foul  smelling. She also states her last menstrual period was 7/13th and  wishes to have a pregnancy test.   HISTORY OF PRESENT ILLNESS:  She started oral contraceptives 1 week ago.  She also has symptoms she describes as being depressed and tired and has  been vomiting as well.   SALIENT PHYSICAL FINDINGS:  External genitalia, vulva and vagina are  normal. The cervix smooth without erosions or lesions. Slight discharge  noted, white in color. Uterus is small, anteverted, flexed and both  adnexa palpable, found to be normal.   PLAN:  The patient will have a serum pregnancy test ordered and a wet  prep and proceed from there.           ______________________________  Ginger Carne, MD     SHB/MEDQ  D:  08/31/2006  T:  09/01/2006  Job:  [id number]

## 2010-05-25 NOTE — Group Therapy Note (Signed)
NAME:  Donna Gordon, Donna Gordon NO.:  [id number]   MEDICAL RECORD NO.:  [id number]          PATIENT TYPE:  WOC   LOCATION:  [facility]                   FACILITY:  [facility]   PHYSICIAN:  Wilburt Finlay, M.D.     DATE OF BIRTH:  05/16/1980   DATE OF SERVICE:                                  CLINIC NOTE   CHIEF COMPLAINT:  Patient presents for Pap smear.  Complains of pain  with intercourse and has been having some vaginal discharge for last  several weeks.   HISTORY OF PRESENT ILLNESS:  The patient complains of vaginal discharge  that started several weeks ago, primarily notices discharge with sexual  intercourse.  Reports discharge is thick and white.  Denies any vaginal  itching or dysuria.  Patient is sexually active with partner of 2 years.  Is not currently on any contraception secondary to lack of insurance.  Patient recently restarted working and is wanting to restart oral  contraceptive pills.  Has used Yaz in the past with good results.  Would  like a prescription for this birth control.  Patient also concerned  about having pain with intercourse over the last 4 months.  Does not  happen with every intercourse, but when in certain positions would note  anterior lower pelvic pain with intercourse.  Has no pelvic pain outside  of intercourse.   REVIEW OF SYSTEMS:  Unremarkable.   PHYSICAL EXAMINATION:  VITAL SIGNS:  Temperature 97.0, pulse 83, blood  pressure 104/72.  GENERAL:  Appeared in no acute distress.  Very pleasant white female.  CARDIOVASCULAR EXAM:  Regular rate and rhythm.  No murmurs appreciated.  LUNGS:  Clear to auscultation bilaterally.  ABDOMEN:  Soft, nontender, nondistended.  No palpable masses were  appreciated.  EXTREMITY EXAM:  No edema.  No cyanosis.  No calf tenderness noted.  PELVIC EXAM:  Vulva appeared normal.  Vaginal mucosa was normal.  Cervix  appeared normal, very anteriorly located.  There was no cervical motion  tenderness.  There was  no adnexal fullness, masses or tenderness noted.  On bimanual exam, patient reports tenderness when upward pressure is  placed on the cervix and downward pressure up the anterior abdominal  wall which she describes as similar to the feeling of discomfort while  having sexual intercourse.   ULTRASOUND FINDINGS:  The pelvic ultrasound done on 07/21/2006 reveals a  retroverted uterus with a stable 3.5 cm subserosal fibroid projecting  from the anterior uterine fundus.  Ovaries were normal, and there was a  small amount of free fluid which was likely physiologic.   ASSESSMENT:  1. Routine GYN exam.  2. Dyspareunia.   PLAN:  Patient's dyspareunia/pelvic pain seems to most likely be related  to her anterior fibroid causing irritation to the anterior abdominal  wall.  This was discussed extensively with the patient.  She is going to  try positional changes because there are some positions in which sexual  intercourse does not cause her to have dyspareunia.  If her pelvic pain  should worsen, she will return to discuss options which include  myomectomy but this was discussed with the patient  including fact that  patient is nulliparous and having a procedure done on her uterus could  put her pregnancy at risk.  Patient seems to understand and will return  for further discussion if her pelvic pain worsens.  Patient was given a  prescription for oral contraceptive pills, Yaz, to use as directed.  She  is to use ibuprofen p.r.n. for pelvic discomfort.  A Pap, Gonorrhea,  Chlamydia was done today.  Also, wet prep was sent secondary to vaginal  discharge noted on exam.  The patient is to return as needed.           ______________________________  Wilburt Finlay, M.D.     LJ/MEDQ  D:  08/09/2006  T:  08/10/2006  Job:  [id number]

## 2010-05-28 NOTE — Group Therapy Note (Signed)
NAME:  ENRIQUE, WEISS NO.:  [id number]   MEDICAL RECORD NO.:  [id number]                   PATIENT TYPE:  OUT   LOCATION:  [facility]                           FACILITY:  [facility]   PHYSICIAN:  Argentina Donovan, MD                     DATE OF BIRTH:  [birth year]   DATE OF SERVICE:  01/09/2003                                    CLINIC NOTE   REASON FOR VISIT:  The patient is a 30 year old nulligravida white female  who was referred from the [facility] because of an episode of menorrhagia which  went on for 10 days when she was changing a pad, she said, every 10 minutes.  Examination there was nonrevealing and a previous visit for apparently  kidney stone in October at [facility] where a sonogram was done that did  show a 3 cm leiomyoma in the uterus; otherwise it was normal.  On  examination the external genitalia was normal, BUS within normal limits.  The vagina was clean and well rugated.  The cervix is clean, nulliparous.  The uterus was retroverted acutely with normal size, shape, and consistency  and the adnexa were normal.  We discussed with the patient the disadvantages  of the acute retroversion which may cause dysmenorrhea and some dyspareunia  at the time of her period which she confirmed, and we discussed the  possibility of using the pill to control the pain and the amount of bleeding  she did.  We are starting her on Ortho Tri-Cyclen Lo and have given her  three sample packs, asked her to follow-up at [facility] where she can  get the birth control pills at a more moderate price as she has no insurance  and is not working at the present time.   DISCHARGE DIAGNOSIS:  Dysmenorrhea and menorrhagia.  Pap smear was done.                                               Argentina Donovan, MD    PR/MEDQ  D:  01/09/2003  T:  01/09/2003  Job:  [id number]

## 2010-05-28 NOTE — Group Therapy Note (Signed)
NAME:  Donna, Gordon NO.:  [id number]   MEDICAL RECORD NO.:  [id number]          PATIENT TYPE:  WOC   LOCATION:  [facility]                   [facility]ACILITY:  [facility]   PHYSICIAN:  Tinnie Gens, MD        DATE OF BIRTH:  1980-08-01   DATE OF SERVICE:  04/29/2004                                    CLINIC NOTE   CHIEF COMPLAINT:  Vaginal discharge and odor with some bleeding.   HISTORY OF PRESENT ILLNESS:  The patient is a 30 year old patient who was  seen 2 weeks ago with vaginal bleeding which has subsequently stopped. She  comes in today with resumption of bleeding and cramping that she thinks is  worse than what her normal period is. However, approximately 1 week ago she  had exceptionally bad vaginal discharge that was thick with a very bad odor.  She denied any irritation, itching, or new irritating factors. The patient  states it was thick and white. She did have some nausea the last day or two  that may be related to her period.   PHYSICAL EXAMINATION:  VITAL SIGNS:  As noted in the chart.  GENERAL:  She is a well-developed, well-nourished white female in no acute  distress.  GENITOURINARY:  She has normal external female genitalia. The vagina is pink  and rugated. The cervix is nulliparous and without lesion. The uterus is  small and retroverted. The adnexa are without mass or tenderness.   IMPRESSION:  1.  Gynecologic examination with Pap smear.  2.  Vaginal discharge.   PLAN:  GC/chlamydia testing, wet prep, and Pap smear today. The patient will  follow up next week for Depo-Provera if this is really her cycle. If she has  a negative pregnancy test she is instructed no unprotected intercourse until  that time.      TP/MEDQ  D:  04/29/2004  T:  04/30/2004  Job:  [id number]

## 2010-05-28 NOTE — Group Therapy Note (Signed)
NAME:  Donna Gordon, Donna Gordon NO.:  [id number]   MEDICAL RECORD NO.:  [id number]          PATIENT TYPE:  WOC   LOCATION[facility]                   [facility]ILITY[facility]   PHYSICIAN:  Tinnie Gens, MD        DATE OF BIRTH:  08-Dec-1980   DATE OF SERVICE:                                    CLINIC NOTE   CHIEF COMPLAINT:  Followup.   HISTORY OF PRESENT ILLNESS:  The patient is a 30 year old G0 who was sent to  the hospital for abnormal uterine bleeding related to Depo.  The patient was  seen here on April 05, 2004, and stated she had been bleeding for  approximately 18 days at that time.  Her bleeding has since stopped.  She  has not any issues related to GYN complaints.  The patient is also  complaining of abnormal bloating after she eats lactated products, but this  has not really helped.  The patient is concerned that she really wants birth  control and something to regulate her periods.  She is interested in  different forms of birth control, but has no job, and no insurance.   PHYSICAL EXAMINATION:  VITAL SIGNS:  Her vital signs are as noted on the  chart.  GENERAL:  She is a well-developed, well-nourished white female in no acute  distress.  ABDOMEN:  Soft, nontender, nondistended.   IMPRESSION:  1.  Abnormal uterine bleeding, probably related to Depo.  2.  Menorrhagia.  3.  Dysmenorrhea.  4.  Undesired fertility.   PLAN:  I have given this patient information regarding Depo-Provera, birth  control pills, NuvaRing and Ortho-Evra patch.  I have also discussed with  her Medicaid for contraception and family planning.  The patient will go to  the health department to see if she can qualify there for this new Medicaid  and possibly get her medication there.      TP/MEDQ  D:  04/15/2004  T:  04/15/2004  Job:  [id number]

## 2010-05-28 NOTE — Group Therapy Note (Signed)
NAME:  Donna Gordon, Donna Gordon NO.:  [id number]   MEDICAL RECORD NO.:  [id number]          PATIENT TYPE:  WOC   [facility]ATION[facility]                   [facility]   PHYSICIAN:  Argentina Donovan, MD        DATE OF BIRTH:  1980/04/22   DATE OF SERVICE:  04/27/2005                                    CLINIC NOTE   The patient is a 30 year old nulligravida white female who is on no  medication at all, but desires birth control.  Her last menstrual period  began the 7th of April and lasted for seven days.  Three days ago she awoke,  went to the bathroom, and had severe lower abdominal pain and pressure that  radiated into her lateral thighs.  She went into the [facility] yesterday and they ran several tests.  Her urine was normal.  Her  hemoglobin was normal.  For reasons I am not quite sure they ran blood  gases.  Chlamydia and GC were negative and a wet prep was normal.   PHYSICAL EXAMINATION:  ABDOMEN:  Soft, flat, nontender.  No organomegaly.  No masses.  No guarding.  No rebound.  PELVIC:  The external genitalia is normal.  BUS revealed a thickened  fluctuant mass just below the urethra extending for about 3 cm up the  urethra to down just before the meatus.  No purulent material or urine could  be expressed by pressure.  Vagina was clean, well rugated.  Cervix was  clean, nulliparous.  Pap smear was taken.  Uterus was retroverted, of normal  size, shape, consistency and normal adnexa.  In addition to the above, the  patient has been complaining of urinary hesitancy and false urge.  In  addition to this, she said she ran a fever and had nausea and vomiting over  the past few days.  SKIN:  Normal turgor and pallor.  No sign of dehydration.   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   PLAN:  Refer her to a urologist.  Start her on doxycycline and start her on  YAZ for birth control.            ______________________________  Argentina Donovan, MD     PR/MEDQ  D:  04/27/2005  T:  04/28/2005  Job:  [id number]

## 2010-05-28 NOTE — Group Therapy Note (Signed)
NAME:  RAJVI, ARMENTOR NO.:  [id number]   MEDICAL RECORD NO.:  [id number]                   PATIENT TYPE:  OUT   LOCATION:  [facility]                           [facility]ACILITY:  [facility]   PHYSICIAN:  Elsie Lincoln, MD                   DATE OF BIRTH:  1980-12-01   DATE OF SERVICE:  07/29/2003                                    CLINIC NOTE   The patient is a 30 year old female who was given Depo Provera for  symptomatic fibroids and menorrhagia on Jun 10, 2003.  The patient presented  to the [facility] on the 11th and the 14th of July with irregular bleeding.  The  patient's hemoglobin at that time was 12.9.  She had ECG negative.  GC/Chlamydia negative.  After discussing with the patient it is probably  just a side effect of the Depo Provera.  The patient states she really will  not stay on it secondary to not being able to remember to take the birth  control pill.  She does have occasional headaches but these are not  bothersome.  She is told to take Tylenol as needed.  She has no aura or  neurologic finding.  The patient states she also has gained a little bit of  weight after taking the Depo Provera.  However, on Jun 10, 2003 she weighed  125.3.  Today she weighs 123.4.  The patient is told that Depo Provera works  by stimulating appetite and causes people to gain weight.  She is just  warned to watch caloric intake.   PHYSICAL EXAMINATION:  VITAL SIGNS:  Temperature 99.3, pulse 101, blood  pressure 141/72, weight 123.4, height 5 feet 5 inches.  PELVIC:  Vaginal examination:  No bleeding.  No discharge.  Cervix:  No CMT.  Uterus:  Anteverted, small, nontender.  Adnexa:  No masses, nontender.   ASSESSMENT/PLAN:  A 29 year old female with abnormal uterine bleeding  secondary to Depo.  1. The patient wants to continue Depo phase 2 on the 15th of August.  2. Calcium supplementation 500 mg p.o. b.i.d. with vitamin D.  3. Return to clinic for Depo.                                 Elsie Lincoln, MD    KL/MEDQ  D:  07/29/2003  T:  07/30/2003  Job:  [id number]

## 2010-08-25 ENCOUNTER — Inpatient Hospital Stay (INDEPENDENT_AMBULATORY_CARE_PROVIDER_SITE_OTHER)
Admission: RE | Admit: 2010-08-25 | Discharge: 2010-08-25 | Disposition: A | Discharge status: Home / Self Care | Payer: Self-pay | Source: Ambulatory Visit | Attending: Family Medicine | Admitting: Family Medicine

## 2010-08-25 DIAGNOSIS — L259 Unspecified contact dermatitis, unspecified cause: Secondary | ICD-10-CM

## 2010-10-08 LAB — CBC
HCT: 40.3
MCHC: 33.8
MCV: 92.7
Platelets: 243
Platelets: 298
RDW: 13.1
RDW: 13.1

## 2010-10-12 ENCOUNTER — Emergency Department (HOSPITAL_COMMUNITY): Payer: Self-pay

## 2010-10-12 ENCOUNTER — Emergency Department (HOSPITAL_COMMUNITY)
Admission: EM | Admit: 2010-10-12 | Discharge: 2010-10-13 | Disposition: A | Discharge status: Home / Self Care | Payer: Self-pay | Attending: Emergency Medicine | Admitting: Emergency Medicine

## 2010-10-12 DIAGNOSIS — K5900 Constipation, unspecified: Secondary | ICD-10-CM | POA: Insufficient documentation

## 2010-10-12 DIAGNOSIS — R143 Flatulence: Secondary | ICD-10-CM | POA: Insufficient documentation

## 2010-10-12 DIAGNOSIS — R142 Eructation: Secondary | ICD-10-CM | POA: Insufficient documentation

## 2010-10-12 DIAGNOSIS — K3189 Other diseases of stomach and duodenum: Secondary | ICD-10-CM | POA: Insufficient documentation

## 2010-10-12 DIAGNOSIS — R141 Gas pain: Secondary | ICD-10-CM | POA: Insufficient documentation

## 2010-10-12 LAB — URINALYSIS, ROUTINE W REFLEX MICROSCOPIC
Ketones, ur: NEGATIVE
Protein, ur: 30 — AB
Urobilinogen, UA: 0.2

## 2010-10-12 LAB — URINE MICROSCOPIC-ADD ON

## 2010-10-12 LAB — PREGNANCY, URINE: Preg Test, Ur: NEGATIVE

## 2010-10-14 ENCOUNTER — Encounter: Payer: Self-pay | Admitting: Gastroenterology

## 2010-10-20 ENCOUNTER — Encounter: Payer: Self-pay | Admitting: Internal Medicine

## 2010-10-22 DIAGNOSIS — K5900 Constipation, unspecified: Secondary | ICD-10-CM | POA: Insufficient documentation

## 2010-10-29 ENCOUNTER — Ambulatory Visit: Payer: Self-pay | Admitting: Internal Medicine

## 2010-11-05 ENCOUNTER — Ambulatory Visit: Payer: Self-pay | Admitting: Gastroenterology

## 2010-12-01 ENCOUNTER — Encounter (HOSPITAL_BASED_OUTPATIENT_CLINIC_OR_DEPARTMENT_OTHER): Payer: Self-pay | Admitting: *Deleted

## 2010-12-01 ENCOUNTER — Emergency Department (HOSPITAL_BASED_OUTPATIENT_CLINIC_OR_DEPARTMENT_OTHER)
Admission: EM | Admit: 2010-12-01 | Discharge: 2010-12-01 | Disposition: A | Discharge status: Home / Self Care | Payer: Self-pay | Attending: Emergency Medicine | Admitting: Emergency Medicine

## 2010-12-01 DIAGNOSIS — J029 Acute pharyngitis, unspecified: Secondary | ICD-10-CM | POA: Insufficient documentation

## 2010-12-01 DIAGNOSIS — J45909 Unspecified asthma, uncomplicated: Secondary | ICD-10-CM | POA: Insufficient documentation

## 2010-12-01 DIAGNOSIS — F17200 Nicotine dependence, unspecified, uncomplicated: Secondary | ICD-10-CM | POA: Insufficient documentation

## 2010-12-01 MED ORDER — DEXAMETHASONE SODIUM PHOSPHATE 10 MG/ML IJ SOLN
10.0000 mg | Freq: Once | INTRAMUSCULAR | Status: AC
  Administered 2010-12-01: 10 mg via INTRAMUSCULAR
  Filled 2010-12-01: qty 1

## 2010-12-01 NOTE — ED Provider Notes (Signed)
History     CSN: [id number] Arrival date & time: 12/01/2010  7:27 PM   First MD Initiated Contact with Patient 12/01/10 1935      Chief Complaint  Patient presents with  . Sore Throat    (Consider location/radiation/quality/duration/timing/severity/associated sxs/prior treatment) Patient is a 30 y.o. female presenting with pharyngitis. The history is provided by the patient. No language interpreter was used.  Sore Throat This is a new problem. The current episode started in the past 7 days. The problem has been unchanged. Associated symptoms include congestion, coughing and a sore throat. Pertinent negatives include no fever or rash. The symptoms are aggravated by swallowing. Treatments tried: otc cold medication. The treatment provided no relief.    Past Medical History  Diagnosis Date  . Asthma   . Depression   . Kidney stones   . History of uterine fibroid     Past Surgical History  Procedure Date  . Myomectomy abdominal approach 08/06/2007  . Tumor removal     Family History  Problem Relation Age of Onset  . Colon cancer      grandfather  . Colon cancer      aunt  . Colon cancer      uncle  . Ulcerative colitis      cousin  . Crohn's disease      cousin  . Liver disease      great-grandfather, alcoholic  . Heart disease      uncle  . Diabetes      uncle    History  Substance Use Topics  . Smoking status: Current Everyday Smoker  . Smokeless tobacco: Not on file  . Alcohol Use: Yes    OB History    Grav Para Term Preterm Abortions TAB SAB Ect Mult Living                  Review of Systems  Constitutional: Negative for fever.  HENT: Positive for congestion and sore throat.   Respiratory: Positive for cough.   Cardiovascular: Negative.   Skin: Negative for rash.  Neurological: Negative.     Allergies  Review of patient's allergies indicates no known allergies.  Home Medications   Current Outpatient Rx  Name Route Sig Dispense Refill    . ACETAMINOPHEN 500 MG PO TABS Oral Take 1,000 mg by mouth once.      Marland Kitchen FLUOXETINE HCL 20 MG PO TABS Oral Take 20 mg by mouth daily.      Marland Kitchen PSEUDOEPH-DOXYLAMINE-DM-APAP 60-7.06-08-998 MG/30ML PO LIQD Oral Take 30 mLs by mouth at bedtime as needed.      Marland Kitchen PSEUDOEPHEDRINE-APAP-DM 40-981-19 MG/30ML PO LIQD Oral Take 30 mLs by mouth every 6 (six) hours as needed. For congestion     . ALBUTEROL SULFATE HFA 108 (90 BASE) MCG/ACT IN AERS Inhalation Inhale 2 puffs into the lungs every 6 (six) hours as needed. For shortness of breath and wheezing     . LEVONORGESTREL 20 MCG/24HR IU IUD Intrauterine 1 each by Intrauterine route once.        BP 138/81  Pulse 85  Temp(Src) 99.4 F (37.4 C) (Oral)  Resp 20  SpO2 97%  Physical Exam  Nursing note and vitals reviewed. Constitutional: She appears well-developed and well-nourished.  HENT:  Head: Normocephalic and atraumatic.  Right Ear: External ear normal.  Left Ear: External ear normal.  Mouth/Throat: Posterior oropharyngeal erythema present.  Eyes: Pupils are equal, round, and reactive to light.  Neck: Normal range of motion. Neck  supple.  Cardiovascular: Normal rate and regular rhythm.   Pulmonary/Chest: Effort normal and breath sounds normal.    ED Course  Procedures (including critical care time)   Labs Reviewed  RAPID STREP SCREEN   No results found.   1. Pharyngitis       MDM  Negative strep:symptoms likely viral        Teressa Lower, NP 12/01/10 2009

## 2010-12-01 NOTE — ED Notes (Signed)
Pt c/o sore throat and tired since Sunday.

## 2010-12-02 NOTE — ED Provider Notes (Signed)
Medical screening examination/treatment/procedure(s) were performed by non-physician practitioner and as supervising physician I was immediately available for consultation/collaboration.   Blakleigh Straw, MD 12/02/10 0005 

## 2011-01-19 ENCOUNTER — Emergency Department (INDEPENDENT_AMBULATORY_CARE_PROVIDER_SITE_OTHER): Payer: Self-pay

## 2011-01-19 ENCOUNTER — Emergency Department (HOSPITAL_BASED_OUTPATIENT_CLINIC_OR_DEPARTMENT_OTHER)
Admission: EM | Admit: 2011-01-19 | Discharge: 2011-01-19 | Disposition: A | Discharge status: Home / Self Care | Payer: Self-pay | Attending: Emergency Medicine | Admitting: Emergency Medicine

## 2011-01-19 ENCOUNTER — Encounter (HOSPITAL_BASED_OUTPATIENT_CLINIC_OR_DEPARTMENT_OTHER): Payer: Self-pay

## 2011-01-19 DIAGNOSIS — F3289 Other specified depressive episodes: Secondary | ICD-10-CM | POA: Insufficient documentation

## 2011-01-19 DIAGNOSIS — J029 Acute pharyngitis, unspecified: Secondary | ICD-10-CM

## 2011-01-19 DIAGNOSIS — F329 Major depressive disorder, single episode, unspecified: Secondary | ICD-10-CM | POA: Insufficient documentation

## 2011-01-19 DIAGNOSIS — R05 Cough: Secondary | ICD-10-CM

## 2011-01-19 DIAGNOSIS — R059 Cough, unspecified: Secondary | ICD-10-CM

## 2011-01-19 DIAGNOSIS — R1110 Vomiting, unspecified: Secondary | ICD-10-CM

## 2011-01-19 DIAGNOSIS — Z79899 Other long term (current) drug therapy: Secondary | ICD-10-CM | POA: Insufficient documentation

## 2011-01-19 DIAGNOSIS — F17200 Nicotine dependence, unspecified, uncomplicated: Secondary | ICD-10-CM | POA: Insufficient documentation

## 2011-01-19 DIAGNOSIS — R042 Hemoptysis: Secondary | ICD-10-CM

## 2011-01-19 DIAGNOSIS — R509 Fever, unspecified: Secondary | ICD-10-CM

## 2011-01-19 DIAGNOSIS — J45909 Unspecified asthma, uncomplicated: Secondary | ICD-10-CM | POA: Insufficient documentation

## 2011-01-19 DIAGNOSIS — R935 Abnormal findings on diagnostic imaging of other abdominal regions, including retroperitoneum: Secondary | ICD-10-CM

## 2011-01-19 LAB — RAPID STREP SCREEN (MED CTR MEBANE ONLY): Streptococcus, Group A Screen (Direct): NEGATIVE

## 2011-01-19 MED ORDER — IOHEXOL 300 MG/ML  SOLN
80.0000 mL | Freq: Once | INTRAMUSCULAR | Status: AC | PRN
  Administered 2011-01-19: 80 mL via INTRAVENOUS

## 2011-01-19 MED ORDER — DEXAMETHASONE SODIUM PHOSPHATE 10 MG/ML IJ SOLN
10.0000 mg | Freq: Once | INTRAMUSCULAR | Status: AC
  Administered 2011-01-19: 10 mg via INTRAMUSCULAR
  Filled 2011-01-19: qty 1

## 2011-01-19 MED ORDER — LIDOCAINE VISCOUS 2 % MT SOLN
20.0000 mL | Freq: Once | OROMUCOSAL | Status: AC
  Administered 2011-01-19: 20 mL via OROMUCOSAL
  Filled 2011-01-19: qty 15

## 2011-01-19 NOTE — ED Notes (Signed)
Pt reports sore throat, cough fever and vomiting yesterday.

## 2011-01-19 NOTE — ED Notes (Signed)
NP at bedside.

## 2011-01-19 NOTE — ED Provider Notes (Signed)
History     CSN: [id number]  Arrival date & time 01/19/11  1347   First MD Initiated Contact with Patient 01/19/11 1408      Chief Complaint  Patient presents with  . Sore Throat  . Cough  . Fever    (Consider location/radiation/quality/duration/timing/severity/associated sxs/prior treatment) Patient is a 31 y.o. female presenting with pharyngitis. The history is provided by the patient. No language interpreter was used.  Sore Throat This is a new problem. The current episode started yesterday. The problem occurs constantly. The problem has been unchanged. Associated symptoms include coughing, a fever, nausea and a sore throat. The symptoms are aggravated by nothing. She has tried nothing for the symptoms.    Past Medical History  Diagnosis Date  . Asthma   . Depression   . History of uterine fibroid     Past Surgical History  Procedure Date  . Myomectomy abdominal approach 08/06/2007  . Tumor removal     Family History  Problem Relation Age of Onset  . Colon cancer      grandfather  . Colon cancer      aunt  . Colon cancer      uncle  . Ulcerative colitis      cousin  . Crohn's disease      cousin  . Liver disease      great-grandfather, alcoholic  . Heart disease      uncle  . Diabetes      uncle    History  Substance Use Topics  . Smoking status: Current Everyday Smoker -- 0.5 packs/day  . Smokeless tobacco: Not on file  . Alcohol Use: Yes     occasional    OB History    Grav Para Term Preterm Abortions TAB SAB Ect Mult Living                  Review of Systems  Constitutional: Positive for fever.  HENT: Positive for sore throat.   Respiratory: Positive for cough.   Gastrointestinal: Positive for nausea.  All other systems reviewed and are negative.    Allergies  Review of patient's allergies indicates no known allergies.  Home Medications   Current Outpatient Rx  Name Route Sig Dispense Refill  . ACETAMINOPHEN 500 MG PO TABS Oral  Take 1,000 mg by mouth once.      . ALBUTEROL SULFATE HFA 108 (90 BASE) MCG/ACT IN AERS Inhalation Inhale 2 puffs into the lungs every 6 (six) hours as needed. For shortness of breath and wheezing     . FLUOXETINE HCL 20 MG PO TABS Oral Take 20 mg by mouth daily.      Marland Kitchen LEVONORGESTREL 20 MCG/24HR IU IUD Intrauterine 1 each by Intrauterine route once.      Marland Kitchen PSEUDOEPH-DOXYLAMINE-DM-APAP 60-7.06-08-998 MG/30ML PO LIQD Oral Take 30 mLs by mouth at bedtime as needed.      Marland Kitchen PSEUDOEPHEDRINE-APAP-DM 14-782-95 MG/30ML PO LIQD Oral Take 30 mLs by mouth every 6 (six) hours as needed. For congestion       BP 122/83  Pulse 103  Temp(Src) 98.2 F (36.8 C) (Oral)  Resp 18  Ht 5\' 5"  (1.651 m)  Wt 160 lb (72.576 kg)  BMI 26.63 kg/m2  SpO2 100%  Physical Exam  Nursing note and vitals reviewed. Constitutional: She is oriented to person, place, and time. She appears well-developed and well-nourished.  HENT:  Head: Normocephalic and atraumatic.  Right Ear: External ear normal.  Left Ear: External ear normal.  Mouth/Throat: Posterior oropharyngeal erythema present.  Cardiovascular: Normal rate and regular rhythm.   Pulmonary/Chest: Effort normal and breath sounds normal.  Abdominal: Soft. Bowel sounds are normal.  Musculoskeletal: Normal range of motion.  Neurological: She is alert and oriented to person, place, and time.  Skin: Skin is warm and dry.    ED Course  Procedures (including critical care time)   Labs Reviewed  RAPID STREP SCREEN   Dg Chest 2 View  01/19/2011  *RADIOLOGY REPORT*  Clinical Data: Sore throat, cough, fever, vomiting, history asthma, smoking  CHEST - 2 VIEW  Comparison: 06/30/2008  Findings: Normal heart size and pulmonary vascularity. Abnormal contour of right mediastinal border at the level of the right heart border, increased in conspicuity since previous exam. Mass and adenopathy are not excluded. Minimal chronic peribronchial thickening. No pulmonary infiltrate,  pleural effusion or pneumothorax. No acute osseous findings.  IMPRESSION: Minimal chronic bronchitic changes. No acute infiltrate. Abnormal contour of the right margin of the inferior mediastinum, cannot exclude mass or adenopathy. Recommend CT chest with contrast to evaluate.  Original Report Authenticated By: Lollie Marrow, M.D.   Ct Chest W Contrast  01/19/2011  *RADIOLOGY REPORT*  Clinical Data: Cough, hemoptysis, abnormal chest radiograph  CT CHEST WITH CONTRAST  Technique:  Multidetector CT imaging of the chest was performed following the standard protocol during bolus administration of intravenous contrast.  Contrast: 80mL OMNIPAQUE IOHEXOL 300 MG/ML IV SOLN  Comparison: Chest radiograph dated 01/19/2011  Findings: Prominent right epicardial fat pad (series 2/image 43), corresponding to the lower mediastinal contour abnormality on chest radiograph.  The lungs are essentially clear.  Mild linear scarring versus atelectasis in the lingula.  No suspicious pulmonary nodules. No pleural effusion or pneumothorax.  Visualized thyroid is unremarkable.  The heart is normal in size.  No pericardial effusion.  No suspicious mediastinal, hilar, or axillary lymphadenopathy.  Visualized upper abdomen is unremarkable.  Visualized osseous structures are within normal limits.  IMPRESSION: Radiographic abnormality corresponds to a prominent right epicardial fat pad.  No evidence of acute cardiopulmonary disease.  Original Report Authenticated By: Charline Bills, M.D.     1. Pharyngitis   2. Cough       MDM  Ct was normal after possible abnormal finding on x-ray:symptoms likely viral:pt treated symptomatically        Teressa Lower, NP 01/19/11 1639

## 2011-01-22 NOTE — ED Provider Notes (Signed)
Medical screening examination/treatment/procedure(s) were performed by non-physician practitioner and as supervising physician I was immediately available for consultation/collaboration.   Gerhard Munch, MD 01/22/11 [phone]

## 2011-01-27 ENCOUNTER — Telehealth: Payer: Self-pay | Admitting: *Deleted

## 2011-01-27 NOTE — Telephone Encounter (Signed)
Pt left message stating that she wants to know about getting an appt to have her Mirena removed because she is gaining weight. Please call back.

## 2011-01-28 NOTE — Telephone Encounter (Signed)
Called pt and pt informed me that she has gained weight off the IUD and would like for it to be removed.  I informed pt that yes the IUD has a low hormone that could cause weight gain and that we have scheduled her an appt for 02/21/11 @ 230pm. I also informed pt that she should do some research on further contraception measures so she can have questions for the provider.  Pt stated understanding and had no further questions.

## 2011-02-21 ENCOUNTER — Ambulatory Visit (INDEPENDENT_AMBULATORY_CARE_PROVIDER_SITE_OTHER): Payer: Self-pay | Admitting: Family

## 2011-02-21 ENCOUNTER — Encounter: Payer: Self-pay | Admitting: Family

## 2011-02-21 DIAGNOSIS — F32A Depression, unspecified: Secondary | ICD-10-CM | POA: Insufficient documentation

## 2011-02-21 DIAGNOSIS — Z30432 Encounter for removal of intrauterine contraceptive device: Secondary | ICD-10-CM

## 2011-02-21 DIAGNOSIS — F329 Major depressive disorder, single episode, unspecified: Secondary | ICD-10-CM

## 2011-02-21 NOTE — Progress Notes (Signed)
  Subjective:    Patient ID: Donna Gordon, female    DOB: 1980/12/28, 31 y.o.   MRN: [id number]  HPI Pt here for IUD removal.  Reports having it inserted 2 years ago.  Concerned due to weight gain and occasional cramping.  Also recently started on Prozac, +improvement in depression symptoms.     Review of Systems  Constitutional: Positive for unexpected weight change (40lb in 2 years).  All other systems reviewed and are negative.       Objective:   Physical Exam  Constitutional: She is oriented to person, place, and time. She appears well-developed and well-nourished.  HENT:  Head: Normocephalic.  Neck: Normal range of motion. Neck supple.  Abdominal: Soft. There is no tenderness.  Genitourinary: Cervix exhibits no motion tenderness, no discharge and no friability. No vaginal discharge found.       IUD strings seen; pt consented prior to removal; removed without difficulty; no bleeding after procedure.    Neurological: She is alert and oriented to person, place, and time.  Skin: Skin is warm and dry.    Pt counseled thoroughly about birth control options and explained that Mirena is likely not the cause of her weight gain; pt still decides to remove device.         Assessment & Plan:  IUD Removal  Plan: IUD removed Condoms for birth control Return for Well-Woman Exam Consider doing TSH at next visit [facility]

## 2011-03-03 ENCOUNTER — Ambulatory Visit: Payer: Self-pay | Admitting: Advanced Practice Midwife

## 2011-07-27 ENCOUNTER — Inpatient Hospital Stay (HOSPITAL_COMMUNITY)
Admission: AD | Admit: 2011-07-27 | Discharge: 2011-07-27 | Disposition: A | Discharge status: Home / Self Care | Payer: Self-pay | Source: Ambulatory Visit | Attending: Family Medicine | Admitting: Family Medicine

## 2011-07-27 ENCOUNTER — Inpatient Hospital Stay (HOSPITAL_COMMUNITY): Payer: Self-pay

## 2011-07-27 ENCOUNTER — Encounter (HOSPITAL_COMMUNITY): Payer: Self-pay

## 2011-07-27 DIAGNOSIS — N83209 Unspecified ovarian cyst, unspecified side: Secondary | ICD-10-CM | POA: Insufficient documentation

## 2011-07-27 DIAGNOSIS — N946 Dysmenorrhea, unspecified: Secondary | ICD-10-CM

## 2011-07-27 DIAGNOSIS — R109 Unspecified abdominal pain: Secondary | ICD-10-CM | POA: Insufficient documentation

## 2011-07-27 DIAGNOSIS — D72829 Elevated white blood cell count, unspecified: Secondary | ICD-10-CM | POA: Insufficient documentation

## 2011-07-27 DIAGNOSIS — N83201 Unspecified ovarian cyst, right side: Secondary | ICD-10-CM

## 2011-07-27 LAB — URINALYSIS, ROUTINE W REFLEX MICROSCOPIC
Glucose, UA: NEGATIVE mg/dL
Leukocytes, UA: NEGATIVE
Nitrite: NEGATIVE
Protein, ur: NEGATIVE mg/dL
pH: 6 (ref 5.0–8.0)

## 2011-07-27 LAB — URINE MICROSCOPIC-ADD ON

## 2011-07-27 LAB — CBC
Hemoglobin: 13.6 g/dL (ref 12.0–15.0)
MCH: 29.5 pg (ref 26.0–34.0)
MCHC: 33.3 g/dL (ref 30.0–36.0)
Platelets: 273 10*3/uL (ref 150–400)

## 2011-07-27 LAB — POCT PREGNANCY, URINE: Preg Test, Ur: NEGATIVE

## 2011-07-27 LAB — WET PREP, GENITAL
Clue Cells Wet Prep HPF POC: NONE SEEN
Trich, Wet Prep: NONE SEEN
Yeast Wet Prep HPF POC: NONE SEEN

## 2011-07-27 MED ORDER — IBUPROFEN 600 MG PO TABS: 600.0000 mg | ORAL_TABLET | Freq: Four times a day (QID) | ORAL | Status: AC | PRN

## 2011-07-27 MED ORDER — KETOROLAC TROMETHAMINE 60 MG/2ML IM SOLN
60.0000 mg | Freq: Once | INTRAMUSCULAR | Status: AC
  Administered 2011-07-27: 60 mg via INTRAMUSCULAR
  Filled 2011-07-27: qty 2

## 2011-07-27 NOTE — MAU Note (Signed)
Pt states lower abd pain into her back, nothing like cramping pain per pt. At 5:30 took warm bath, did not help pain. Took ibuprofen last pm for pain with no relief. Spotting began yesterday, bleeding started at 0530 this am. Vaginal discharge white and thick, denies itching or burning.

## 2011-07-27 NOTE — MAU Provider Note (Signed)
History     CSN: [id number]  Arrival date and time: 07/27/11 1129   First Provider Initiated Contact with Patient 07/27/11 1333      Chief Complaint  Patient presents with  . Abdominal Pain  . Vaginal Bleeding   HPI Donna Gordon 31 y.o. Comes to MAU today with lower abdominal pain and cramping.  Hx of surgery for fibroids - myomectomy in August 2009.  Has made an appointment in the GYN clinic on 08-05-11 but pain is so severe she is unable to wait for appointment.  Began having pain yesterday and bleeding today.  LMP was 07-05-11 so not time for menses.  Took ibuprofen last night.  No pain medication today.  Upon awakening today had pain on both sides in lower quadrants, middle of abdomen near umbilicus, and radiating to her back - seemed to "hurt all over". Seen last in the GYN clinic in February and had Mirena IUD removed.  OB History    Grav Para Term Preterm Abortions TAB SAB Ect Mult Living   0               Past Medical History  Diagnosis Date  . Asthma   . Depression   . History of uterine fibroid   . Fibroid     Past Surgical History  Procedure Date  . Myomectomy abdominal approach 08/06/2007  . Tumor removal   . Uterine fibroid surgery     Family History  Problem Relation Age of Onset  . Colon cancer      uncle/aunt/grandfather  . Ulcerative colitis      cousin  . Crohn's disease      cousin  . Liver disease      great-grandfather, alcoholic  . Heart disease      uncle  . Diabetes      uncle  . Other Neg Hx     History  Substance Use Topics  . Smoking status: Current Everyday Smoker -- 0.5 packs/day    Types: Cigarettes  . Smokeless tobacco: Never Used  . Alcohol Use: Yes     occasional    Allergies: No Known Allergies  Prescriptions prior to admission  Medication Sig Dispense Refill  . albuterol (PROVENTIL HFA;VENTOLIN HFA) 108 (90 BASE) MCG/ACT inhaler Inhale 2 puffs into the lungs every 6 (six) hours as needed. For shortness of breath  and wheezing       . calcium carbonate (TUMS - DOSED IN MG ELEMENTAL CALCIUM) 500 MG chewable tablet Chew 2 tablets by mouth daily as needed. For heartburn      . ibuprofen (ADVIL,MOTRIN) 200 MG tablet Take 600 mg by mouth once. For pain        Review of Systems  Constitutional: Negative for fever.  Gastrointestinal: Positive for abdominal pain. Negative for nausea and vomiting.  Genitourinary: Negative for dysuria.   Physical Exam   Blood pressure 126/73, pulse 60, temperature 97.6 F (36.4 C), temperature source Oral, resp. rate 16, height 5\' 6"  (1.676 m), weight 162 lb (73.483 kg), last menstrual period 07/27/2011, SpO2 100.00%.  Physical Exam  Nursing note and vitals reviewed. Constitutional: She is oriented to person, place, and time. She appears well-developed and well-nourished.       Appears uncomfortable.  Eyes closed but answers questions.  HENT:  Head: Normocephalic.  Eyes: EOM are normal.  Neck: Neck supple.  GI: Soft. There is no tenderness. There is no rebound and no guarding.  Genitourinary:  Speculum exam: Vulva - negative Vagina - Mod amount of dark blood, no odor Cervix - no active bleeding Bimanual exam: Cervix closed Uterus non tender, normal size Adnexa  Tender on left, no masses bilaterally GC/Chlam, wet prep done Chaperone present for exam.  Musculoskeletal: Normal range of motion.  Neurological: She is alert and oriented to person, place, and time.  Skin: Skin is warm and dry.  Psychiatric: She has a normal mood and affect.    MAU Course  Procedures Results for orders placed during the hospital encounter of 07/27/11 (from the past 24 hour(s))  URINALYSIS, ROUTINE W REFLEX MICROSCOPIC     Status: Abnormal   Collection Time   07/27/11  1:05 PM      Component Value Range   Color, Urine YELLOW  YELLOW   APPearance CLEAR  CLEAR   Specific Gravity, Urine 1.020  1.005 - 1.030   pH 6.0  5.0 - 8.0   Glucose, UA NEGATIVE  NEGATIVE mg/dL   Hgb  urine dipstick LARGE (*) NEGATIVE   Bilirubin Urine NEGATIVE  NEGATIVE   Ketones, ur NEGATIVE  NEGATIVE mg/dL   Protein, ur NEGATIVE  NEGATIVE mg/dL   Urobilinogen, UA 0.2  0.0 - 1.0 mg/dL   Nitrite NEGATIVE  NEGATIVE   Leukocytes, UA NEGATIVE  NEGATIVE  URINE MICROSCOPIC-ADD ON     Status: Abnormal   Collection Time   07/27/11  1:05 PM      Component Value Range   Squamous Epithelial / LPF FEW (*) RARE   WBC, UA 0-2  <3 WBC/hpf   RBC / HPF 3-6  <3 RBC/hpf   Bacteria, UA FEW (*) RARE   Urine-Other MUCOUS PRESENT    POCT PREGNANCY, URINE     Status: Normal   Collection Time   07/27/11  1:10 PM      Component Value Range   Preg Test, Ur NEGATIVE  NEGATIVE  CBC     Status: Abnormal   Collection Time   07/27/11  1:59 PM      Component Value Range   WBC 14.2 (*) 4.0 - 10.5 K/uL   RBC 4.61  3.87 - 5.11 MIL/uL   Hemoglobin 13.6  12.0 - 15.0 g/dL   HCT 78.2  95.6 - 21.3 %   MCV 88.5  78.0 - 100.0 fL   MCH 29.5  26.0 - 34.0 pg   MCHC 33.3  30.0 - 36.0 g/dL   RDW 08.6  57.8 - 46.9 %   Platelets 273  150 - 400 K/uL  WET PREP, GENITAL     Status: Abnormal   Collection Time   07/27/11  3:48 PM      Component Value Range   Yeast Wet Prep HPF POC NONE SEEN  NONE SEEN   Trich, Wet Prep NONE SEEN  NONE SEEN   Clue Cells Wet Prep HPF POC NONE SEEN  NONE SEEN   WBC, Wet Prep HPF POC FEW (*) NONE SEEN    MDM Toradol 60 mg IM for pain. - Reduced pain and client fell asleep in room after ultrasound.  Currently have low suspicion for appendicitis as pain is LLQ and client is not having nausea and vomiting.  Clinical Data: Pelvic pain. Fibroids. Abnormal uterine bleeding.  TRANSABDOMINAL AND TRANSVAGINAL ULTRASOUND OF PELVIS  Technique: Both transabdominal and transvaginal ultrasound  examinations of the pelvis were performed. Transabdominal  technique was performed for global imaging of the pelvis including  uterus, ovaries, adnexal regions, and pelvic cul-de-sac.  It was necessary to  proceed with endovaginal exam following the  transabdominal exam to visualize the right ovary and adnexa.  Comparison: 09/23/2009  Findings:  Uterus: 7.7 x 4.3 x 5.3 cm. Retroverted. No fibroids or other  uterine mass identified.  Endometrium: Double layer thickness measures 6 mm transvaginally.  No focal lesion visualized.  Right ovary: 3.6 x 3.6 x 2.4 cm. A small cystic lesion with low-  level internal echoes is seen in the right ovary measuring 1.4 cm  in maximum diameter. This likely represents a hemorrhagic cyst,  although a small endometrioma cannot definitely be excluded.  Left ovary: 3.1 x 2.9 x 1.7 cm. Normal appearance.  Other Findings: A small amount of free fluid in right adnexa and  pelvic cul-de-sac.  IMPRESSION:  1. Retroverted uterus. No evidence of fibroids.  2. 1.4 cm complex right ovarian cyst, likely representing a  hemorrhagic cyst although a small endometrioma cannot definitely be  excluded. Recommend follow-up by transvaginal pelvic ultrasound in  6-12 weeks.  Discussed results with client.  Advised that elevated WBC may be due to pain or perhaps infection.  Advised to return for further evaluation if symptoms worsen.  Assessment and Plan  Right ovarian cyst Abdominal pain - possibly dysmenorrhea Elevated WBC  Plan rx ibuprofen 600 mg po q 6 hours PRN (#30) no refills Keep appointment in the GYN clinic Cultures are pending - we will call if you need any treatment. Return sooner if your symptoms worsen.  BURLESON,TERRI 07/27/2011, 1:43 PM

## 2011-07-27 NOTE — MAU Note (Signed)
Patient states she has a history of fibroids and has an appointment with the [facility] on 7-26. Started having pain and bleeding 7-16. States she just had a period 3 weeks ago.

## 2011-07-28 NOTE — MAU Provider Note (Signed)
Chart reviewed and agree with management and plan.  

## 2011-08-05 ENCOUNTER — Encounter: Payer: Self-pay | Admitting: Advanced Practice Midwife

## 2011-08-05 ENCOUNTER — Ambulatory Visit (INDEPENDENT_AMBULATORY_CARE_PROVIDER_SITE_OTHER): Payer: Self-pay | Admitting: Advanced Practice Midwife

## 2011-08-05 ENCOUNTER — Telehealth: Payer: Self-pay | Admitting: Medical

## 2011-08-05 ENCOUNTER — Other Ambulatory Visit: Payer: Self-pay | Admitting: Advanced Practice Midwife

## 2011-08-05 VITALS — BP 100/64 | HR 87 | Temp 97.5°F | Resp 12 | Ht 65.0 in | Wt 161.2 lb

## 2011-08-05 DIAGNOSIS — N83209 Unspecified ovarian cyst, unspecified side: Secondary | ICD-10-CM

## 2011-08-05 DIAGNOSIS — Z01419 Encounter for gynecological examination (general) (routine) without abnormal findings: Secondary | ICD-10-CM

## 2011-08-05 DIAGNOSIS — K589 Irritable bowel syndrome without diarrhea: Secondary | ICD-10-CM

## 2011-08-05 MED ORDER — FLUOXETINE HCL 20 MG PO CAPS: 20.0000 mg | ORAL_CAPSULE | Freq: Every day | ORAL | Status: DC

## 2011-08-05 NOTE — Telephone Encounter (Signed)
LM for pt to return call to clinic for appt information. Pt has follow-up US scheduled on 08/24/11 @ 10:45 am.

## 2011-08-05 NOTE — Progress Notes (Signed)
Patient ID: Donna Gordon, female   DOB: 09/26/1980, 31 y.o.   MRN: [id number] Subjective:     Donna Gordon is a 31 y.o. female here for a routine exam.  Current complaints: low pelvic and RLQ pain ([place] confirmed R ovarian cyst), mild depressed/anxious mood.     Pt presents for annual pap/breast exam. Last pap was 9/13, Negative. 2/13 IUD removal. Pt using condoms for contraception.  Pt complains of low pelvic pain, does not parallel menses. Pain is dull with concomitant sharp crampy pain. Diet is high fat, low fiber, no exercise. She reports irregular bowel habits - days of constipation, days of regular BMs and days of diarrhea. No food triggers identified.Reports significant bloating and flatus that is episodic. Worse when she is upset or worried.Notes she previously took Prozac 20 mg per day, has been out for 3 months would like to restart. Notes she has been more irritable and emotionally sensitive recently. It has not inhibited her functioning. Denies suicidal thinking or thoughts of death.  Was given Xanax, but does not like how it makes her feel.  Gynecologic History Patient's last menstrual period was 07/27/2011. Contraception: condoms Last Pap: 9/11. Results were: normal  Obstetric History OB History    Grav Para Term Preterm Abortions TAB SAB Ect Mult Living   0              The following portions of the patient's history were reviewed and updated as appropriate: allergies, current medications, past family history, past medical history, past social history, past surgical history and problem list.  Review of Systems Pertinent items are noted in HPI.    Objective:    BP 100/64  Pulse 87  Temp 97.5 F (36.4 C) (Oral)  Resp 12  Ht 5\' 5"  (1.651 m)  Wt 73.12 kg (161 lb 3.2 oz)  BMI 26.83 kg/m2  LMP 07/27/2011 General appearance: alert and cooperative Head: Normocephalic, without obvious abnormality, atraumatic Neck: no adenopathy, supple, symmetrical, trachea midline and  thyroid not enlarged, symmetric, no tenderness/mass/nodules Lungs: clear to auscultation bilaterally Breasts: normal appearance, no masses or tenderness, No nipple retraction or dimpling, No nipple discharge or bleeding, No axillary or supraclavicular adenopathy, Normal to palpation without dominant masses, Taught monthly breast self examination Heart: regular rate and rhythm Abdomen: normal findings: bowel sounds normal, no masses palpable and soft, tender in RLQ, other quadrants nontender Pelvic: cervix normal in appearance, external genitalia normal, no adnexal masses or tenderness, no cervical motion tenderness, rectovaginal septum normal, uterus normal size, shape, and consistency and vagina normal without discharge    Assessment:    Healthy female exam.   Irritable bowel syndrome, mixed constipation and diarrhea Depression  Plan:    Education reviewed: low fat high fiber diet, fiber supplementation, increase physical activity. Contraception: condoms.    Patient recommended to self-refer to [facility] for maintenance of mood complaints and treatment. Rx for Prozac 20 mg provided today. Advised we cannot refill this med, and we do not recommend stopping it suddenly. She promises to get mental health provider.  Pt will schedule appt for f/u US as recommended at MAU visit earlier this month for Right ovarian cyst.  [place], PA-S Wynelle Bourgeois CNM present for exam and pt counseling.  I also examined this patient and agree with note

## 2011-08-05 NOTE — Patient Instructions (Signed)
Depression, Adolescent and Adult Depression is a true and treatable medical condition. In general there are two kinds of depression:  Depression we all experience in some form. For example depression from the death of a loved one, financial distress or natural disasters will trigger or increase depression.   Clinical depression, on the other hand, appears without an apparent cause or reason. This depression is a disease. Depression may be caused by chemical imbalance in the body and brain or may come as a response to a physical illness. Alcohol and other drugs can cause depression.  DIAGNOSIS  The diagnosis of depression is usually based upon symptoms and medical history. TREATMENT  Treatments for depression fall into three categories. These are:  Drug therapy. There are many medicines that treat depression. Responses may vary and sometimes trial and error is necessary to determine the best medicines and dosage for a particular patient.   Psychotherapy, also called talking treatments, helps people resolve their problems by looking at them from a different point of view and by giving people insight into their own personal makeup. Traditional psychotherapy looks at a childhood source of a problem. Other psychotherapy will look at current conflicts and move toward solving those. If the cause of depression is drug use, counseling is available to help abstain. In time the depression will usually improve. If there were underlying causes for the chemical use, they can be addressed.   ECT (electroconvulsive therapy) or shock treatment is not as commonly used today. It is a very effective treatment for severe suicidal depression. During ECT electrical impulses are applied to the head. These impulses cause a generalized seizure. It can be effective but causes a loss of memory for recent events. Sometimes this loss of memory may include the last several months.  Treat all depression or suicide threats as  serious. Obtain professional help. Do not wait to see if serious depression will get better over time without help. Seek help for yourself or those around you. In the [place] the number to the National Suicide Help Lines With 24 Hour Help Are: 1-800-SUICIDE (703)601-7963 Document Released: 12/25/1999 Document Revised: 12/16/2010 Document Reviewed: 08/15/2007 [facility] Patient Information 2012 [place], [place].Irritable Bowel Syndrome Irritable Bowel Syndrome (IBS) is caused by a disturbance of normal bowel function. Other terms used are spastic colon, mucous colitis, and irritable colon. It does not require surgery, nor does it lead to cancer. There is no cure for IBS. But with proper diet, stress reduction, and medication, you will find that your problems (symptoms) will gradually disappear or improve. IBS is a common digestive disorder. It usually appears in late adolescence or early adulthood. Women develop it twice as often as men. CAUSES  After food has been digested and absorbed in the small intestine, waste material is moved into the colon (large intestine). In the colon, water and salts are absorbed from the undigested products coming from the small intestine. The remaining residue, or fecal material, is held for elimination. Under normal circumstances, gentle, rhythmic contractions on the bowel walls push the fecal material along the colon towards the rectum. In IBS, however, these contractions are irregular and poorly coordinated. The fecal material is either retained too long, resulting in constipation, or expelled too soon, producing diarrhea. SYMPTOMS  The most common symptom of IBS is pain. It is typically in the lower left side of the belly (abdomen). But it may occur anywhere in the abdomen. It can be felt as heartburn, backache, or even as a dull pain in  the arms or shoulders. The pain comes from excessive bowel-muscle spasms and from the buildup of gas and fecal material in the colon. This  pain:  Can range from sharp belly (abdominal) cramps to a dull, continuous ache.   Usually worsens soon after eating.   Is typically relieved by having a bowel movement or passing gas.  Abdominal pain is usually accompanied by constipation. But it may also produce diarrhea. The diarrhea typically occurs right after a meal or upon arising in the morning. The stools are typically soft and watery. They are often flecked with secretions (mucus). Other symptoms of IBS include:  Bloating.   Loss of appetite.   Heartburn.   Feeling sick to your stomach (nausea).   Belching   Vomiting   Gas.  IBS may also cause a number of symptoms that are unrelated to the digestive system:  Fatigue.   Headaches.   Anxiety   Shortness of breath   Difficulty in concentrating.   Dizziness.  These symptoms tend to come and go. DIAGNOSIS  The symptoms of IBS closely mimic the symptoms of other, more serious digestive disorders. So your caregiver may wish to perform a variety of additional tests to exclude these disorders. He/she wants to be certain of learning what is wrong (diagnosis). The nature and purpose of each test will be explained to you. TREATMENT A number of medications are available to help correct bowel function and/or relieve bowel spasms and abdominal pain. Among the drugs available are:  Mild, non-irritating laxatives for severe constipation and to help restore normal bowel habits.   Specific anti-diarrheal medications to treat severe or prolonged diarrhea.   Anti-spasmodic agents to relieve intestinal cramps.   Your caregiver may also decide to treat you with a mild tranquilizer or sedative during unusually stressful periods in your life.  The important thing to remember is that if any drug is prescribed for you, make sure that you take it exactly as directed. Make sure that your caregiver knows how well it worked for you. HOME CARE INSTRUCTIONS   Avoid foods that are high in  fat or oils. Some examples ZOX:WRUEA cream, butter, frankfurters, sausage, and other fatty meats.   Avoid foods that have a laxative effect, such as fruit, fruit juice, and dairy products.   Cut out carbonated drinks, chewing gum, and "gassy" foods, such as beans and cabbage. This may help relieve bloating and belching.   Bran taken with plenty of liquids may help relieve constipation.   Keep track of what foods seem to trigger your symptoms.   Avoid emotionally charged situations or circumstances that produce anxiety.   Start or continue exercising.   Get plenty of rest and sleep.  MAKE SURE YOU:   Understand these instructions.   Will watch your condition.   Will get help right away if you are not doing well or get worse.  Document Released: 12/27/2004 Document Revised: 12/16/2010 Document Reviewed: 08/17/2007 [facility] Patient Information 2012 [place], [place].

## 2011-08-08 NOTE — Telephone Encounter (Signed)
Attempted to contact patient again today regarding [place] appt information. There was no answer at the pt's home phone number and her cell phone number has been disconnected.

## 2011-08-09 NOTE — Telephone Encounter (Signed)
Attempted to contact pt for the third time this morning to give her [place] appt information for 08/24/11 @ 10:45 am. LM for pt to return call to clinic for information about [place] appt.

## 2011-08-10 ENCOUNTER — Encounter: Payer: Self-pay | Admitting: *Deleted

## 2011-08-10 NOTE — Telephone Encounter (Signed)
Letter sent to patient via certified mail

## 2011-08-24 ENCOUNTER — Ambulatory Visit (HOSPITAL_COMMUNITY): Admission: RE | Admit: 2011-08-24 | Payer: Self-pay | Source: Ambulatory Visit

## 2011-08-25 ENCOUNTER — Telehealth: Payer: Self-pay | Admitting: Medical

## 2011-08-25 NOTE — Telephone Encounter (Signed)
Patient called back asking to speak with a nurse. States that she missed her [place] appointment 2/2 pelvic pain. Will plan to keep appointment for tomorrow. Patient states that she has had another episode of bleeding that started Tuesday. She is passing clots. She has had approximately 4 episodes of bleeding in the last 2 1/2 months. She is taking 600 mg ibuprofen which she states is not significantly helping her pain. Patient had Mirena removed 3-4 months ago. She states that this episode is similar to that evaluated at her last visit. See last visit note. Patient was instructed to keep [place] appointment and that we would contact patient with appropriate follow-up, if any, based on the results of the [place]. Patient was also instructed that she can be evaluated in MAU at any time when she feels that her condition has worsened or she requires additional care when the clinic is not open. The patient wanted to know when the next available appointment would be to be seen in the clinic, but when I went to discuss with the front office staff the patient hung up.

## 2011-08-25 NOTE — Telephone Encounter (Signed)
Patient called stating that she was here a few weeks ago and was given an appointment for [place]. She missed that appointment and attempted to reschedule and was told to call here in order to do so. She is having another cycle now and is in pain and would like to get [place] asap.

## 2011-08-25 NOTE — Telephone Encounter (Signed)
According to patient chart. [place] was already rescheduled for 08/26/11 @ 2:15pm. Called patient and LM with this appointment.

## 2011-08-26 ENCOUNTER — Inpatient Hospital Stay (HOSPITAL_COMMUNITY): Admission: RE | Admit: 2011-08-26 | Payer: Self-pay | Source: Ambulatory Visit

## 2011-08-26 ENCOUNTER — Ambulatory Visit (HOSPITAL_COMMUNITY)
Admission: RE | Admit: 2011-08-26 | Discharge: 2011-08-26 | Disposition: A | Discharge status: Home / Self Care | Payer: Self-pay | Source: Ambulatory Visit | Attending: Advanced Practice Midwife | Admitting: Advanced Practice Midwife

## 2011-08-26 DIAGNOSIS — N83209 Unspecified ovarian cyst, unspecified side: Secondary | ICD-10-CM | POA: Insufficient documentation

## 2011-08-26 DIAGNOSIS — N949 Unspecified condition associated with female genital organs and menstrual cycle: Secondary | ICD-10-CM | POA: Insufficient documentation

## 2011-08-26 DIAGNOSIS — N938 Other specified abnormal uterine and vaginal bleeding: Secondary | ICD-10-CM | POA: Insufficient documentation

## 2011-11-14 ENCOUNTER — Encounter: Payer: Self-pay | Admitting: Family Medicine

## 2011-11-14 ENCOUNTER — Ambulatory Visit (INDEPENDENT_AMBULATORY_CARE_PROVIDER_SITE_OTHER): Payer: Self-pay | Admitting: Family Medicine

## 2011-11-14 VITALS — BP 126/84 | HR 77 | Temp 98.5°F | Ht 65.5 in | Wt 147.0 lb

## 2011-11-14 DIAGNOSIS — N946 Dysmenorrhea, unspecified: Secondary | ICD-10-CM

## 2011-11-14 DIAGNOSIS — Z309 Encounter for contraceptive management, unspecified: Secondary | ICD-10-CM

## 2011-11-14 MED ORDER — NORGESTIMATE-ETH ESTRADIOL 0.25-35 MG-MCG PO TABS: 1.0000 | ORAL_TABLET | Freq: Every day | ORAL | Status: DC

## 2011-11-14 NOTE — Patient Instructions (Signed)
Oral Contraception Use Oral contraceptives (OCs) are medicines taken to prevent pregnancy. OCs work by preventing the ovaries from releasing eggs. The hormones in OCs also cause the cervical mucus to thicken, preventing the sperm from entering the uterus. The hormones also cause the uterine lining to become thin, not allowing a fertilized egg to attach to the inside of the uterus. OCs are highly effective when taken exactly as prescribed. However, OCs do not prevent sexually transmitted diseases (STDs). Safe sex practices, such as using condoms along with an OC, can help prevent STDs.  Before taking OCs, you may have a physical exam and Pap test. Your caregiver may also order blood tests if necessary. Your caregiver will make sure you are a good candidate for oral contraception. Discuss with your caregiver the possible side effects of the OC you may be prescribed. When starting an OC, it can take 2 to 3 months for the body to adjust to the changes in hormone levels in your body.  HOW TO TAKE ORAL CONTRACEPTIVES Your caregiver may advise you on how to start taking the first cycle of OCs. Otherwise, you can:  Start on day 1 of your menstrual period. You will not need any backup contraceptive protection with this start time.  Start on the first Sunday after your menstrual period or the day you get your prescription. In these cases, you will need to use backup contraceptive protection for the first 7-day cycle. After you have started taking OCs:  If you forget to take 1 pill, take it as soon as you remember. Take the next pill at the regular time.  If you miss 2 or more pills, use backup birth control until your next menstrual period starts.  If you use a 28-day pack that contains inactive pills and you miss 1 of the last 7 pills (pills with no hormones), it will not matter. Throw away the rest of the non-hormone pills and start a new pill pack. No matter which day you start the OC, you will always start  a new pack on that same day of the week. Have an extra pack of OCs and a backup contraceptive method available in case you miss some pills or lose your OC pack. HOME CARE INSTRUCTIONS   Do not smoke.  Always use a condom to protect against STDs. OCs do not protect against STDs.  Use a calendar to mark your menstrual period days.  Read the information and directions that come with your OC. Talk to your caregiver if you have questions. SEEK MEDICAL CARE IF:   You develop nausea and vomiting.  You have abnormal vaginal discharge or bleeding.  You develop a rash.  You miss your menstrual period.  You are losing your hair.  You need treatment for mood swings or depression.  You get dizzy when taking the OC.  You develop acne from taking the OC.  You become pregnant. SEEK IMMEDIATE MEDICAL CARE IF:   You develop chest pain.  You develop shortness of breath.  You have an uncontrolled or severe headache.  You develop numbness or slurred speech.  You develop visual problems.  You develop pain, redness, and swelling in the legs. Document Released: 12/16/2010 Document Revised: 03/21/2011 Document Reviewed: 12/16/2010 Exit[employer] Patient Information 2013 [employer].  

## 2011-11-14 NOTE — Progress Notes (Signed)
Subjective:    Patient ID: Donna Gordon, female    DOB: November 17, 1980, 31 y.o.   MRN: [id number]  HPI Pt is a 31 y.o. G0P0 here to discuss birth control options. She had a Mirena IUD for two years that was removed in Feb 2013 because she felt it was causing weight gain. She has been using condoms since then. She has also tried depo shots in past but stopped due to weight gain. She was on OCPs a long time ago - tried Autom[employer].   Gyn history: Menarche age 41, fairly regular periods, 7-9 days, heavy first few days. Worsening dysmenorrhea recently. Taking ibuprofen 600 mg q 6 hours with some relief. LMP 11/11/11. No pregancies. History of ovarian cyst July 2013 - right, small complex cyst on sono - resolved on repeat sono 08/2011. Uterus retroverted/retroflexed but otherwise normal on sono 8/13.  Had pap July 2013, negative, 2011 also negative.  Review of Systems  Constitutional: Negative for fever, chills and unexpected weight change.  Eyes: Negative for visual disturbance.  Respiratory: Negative for cough, shortness of breath and wheezing.   Cardiovascular: Negative for chest pain and palpitations.  Gastrointestinal: Negative for nausea, vomiting and abdominal pain.  Genitourinary: Positive for menstrual problem. Negative for dysuria, vaginal discharge, difficulty urinating, vaginal pain and dyspareunia.   Past Medical History  Diagnosis Date  . Asthma   . Depression   . History of uterine fibroid   . Fibroid   . Ovarian cyst 2013   No person history of DVT or thromboembolism.  Past Surgical History  Procedure Date  . Myomectomy abdominal approach 08/06/2007  . Tumor removal   . Uterine fibroid surgery    Social:  Smokes about a pack a day.   Family History  Problem Relation Age of Onset  . Colon cancer      uncle/aunt/grandfather  . Ulcerative colitis      cousin  . Crohn's disease      cousin  . Liver disease      great-grandfather, alcoholic  . Heart  disease      uncle  . Diabetes      uncle  . Other Neg Hx   . Cancer Paternal Aunt 31    pancreatic   . Cancer Maternal Grandmother 17    breast   No family history of thromboembolic disease.      Objective:   Physical Exam  Constitutional: She is oriented to person, place, and time. She appears well-developed and well-nourished. No distress.  HENT:  Head: Normocephalic and atraumatic.  Eyes: Conjunctivae normal and EOM are normal.  Neck: Normal range of motion. Neck supple. No tracheal deviation present. No thyromegaly present.  Cardiovascular: Normal rate, regular rhythm and normal heart sounds.   Pulmonary/Chest: Effort normal. No respiratory distress. She has no wheezes.  Abdominal: Soft. Bowel sounds are normal. She exhibits no distension. There is no tenderness. There is no rebound and no guarding.  Musculoskeletal: Normal range of motion. She exhibits no edema and no tenderness.  Neurological: She is alert and oriented to person, place, and time.  Skin: Skin is warm and dry.  Psychiatric: She has a normal mood and affect.    Filed Vitals:   11/14/11 1539  BP: 126/84  Pulse: 77  Temp: 98.5 F (36.9 C)      Assessment & Plan:  31 y.o. G0P0 here for birth control. -  Start OCPs. Pt asks for something that will not be too expensive as no  insurance. Discussed weight gain, risks of cardiovascular disease with OCPs and smoking. Pt expressed understanding-  - Dysmenorrhea - continue ibuprofen as needed.   Napoleon Form, MD

## 2011-11-15 DIAGNOSIS — Z309 Encounter for contraceptive management, unspecified: Secondary | ICD-10-CM | POA: Insufficient documentation

## 2011-12-04 ENCOUNTER — Emergency Department (HOSPITAL_BASED_OUTPATIENT_CLINIC_OR_DEPARTMENT_OTHER)
Admission: EM | Admit: 2011-12-04 | Discharge: 2011-12-04 | Disposition: A | Discharge status: Home / Self Care | Payer: Self-pay | Attending: Emergency Medicine | Admitting: Emergency Medicine

## 2011-12-04 ENCOUNTER — Emergency Department (HOSPITAL_BASED_OUTPATIENT_CLINIC_OR_DEPARTMENT_OTHER): Payer: Self-pay

## 2011-12-04 ENCOUNTER — Encounter (HOSPITAL_BASED_OUTPATIENT_CLINIC_OR_DEPARTMENT_OTHER): Payer: Self-pay | Admitting: Emergency Medicine

## 2011-12-04 DIAGNOSIS — J45909 Unspecified asthma, uncomplicated: Secondary | ICD-10-CM | POA: Insufficient documentation

## 2011-12-04 DIAGNOSIS — M25559 Pain in unspecified hip: Secondary | ICD-10-CM | POA: Insufficient documentation

## 2011-12-04 DIAGNOSIS — F3289 Other specified depressive episodes: Secondary | ICD-10-CM | POA: Insufficient documentation

## 2011-12-04 DIAGNOSIS — Y939 Activity, unspecified: Secondary | ICD-10-CM | POA: Insufficient documentation

## 2011-12-04 DIAGNOSIS — F17200 Nicotine dependence, unspecified, uncomplicated: Secondary | ICD-10-CM | POA: Insufficient documentation

## 2011-12-04 DIAGNOSIS — W19XXXA Unspecified fall, initial encounter: Secondary | ICD-10-CM

## 2011-12-04 DIAGNOSIS — Z8742 Personal history of other diseases of the female genital tract: Secondary | ICD-10-CM | POA: Insufficient documentation

## 2011-12-04 DIAGNOSIS — T148XXA Other injury of unspecified body region, initial encounter: Secondary | ICD-10-CM

## 2011-12-04 DIAGNOSIS — Y92009 Unspecified place in unspecified non-institutional (private) residence as the place of occurrence of the external cause: Secondary | ICD-10-CM | POA: Insufficient documentation

## 2011-12-04 DIAGNOSIS — Z79899 Other long term (current) drug therapy: Secondary | ICD-10-CM | POA: Insufficient documentation

## 2011-12-04 DIAGNOSIS — S20229A Contusion of unspecified back wall of thorax, initial encounter: Secondary | ICD-10-CM | POA: Insufficient documentation

## 2011-12-04 DIAGNOSIS — W1809XA Striking against other object with subsequent fall, initial encounter: Secondary | ICD-10-CM | POA: Insufficient documentation

## 2011-12-04 DIAGNOSIS — F329 Major depressive disorder, single episode, unspecified: Secondary | ICD-10-CM | POA: Insufficient documentation

## 2011-12-04 MED ORDER — OXYCODONE-ACETAMINOPHEN 5-325 MG PO TABS
2.0000 | ORAL_TABLET | Freq: Once | ORAL | Status: AC
  Administered 2011-12-04: 2 via ORAL
  Filled 2011-12-04: qty 2
  Filled 2011-12-04: qty 1

## 2011-12-04 MED ORDER — OXYCODONE-ACETAMINOPHEN 5-325 MG PO TABS: 1.0000 | ORAL_TABLET | Freq: Four times a day (QID) | ORAL | Status: DC | PRN

## 2011-12-04 NOTE — ED Notes (Signed)
Pt fell down 9 wooden steps.  Pt caught foot in pant leg which caused the fall.  Pt hit back of head, right hip and lower back.  No LOC.  Some dizziness, no nausea.  Pt has abrasion and bruise to right hip. No elimination problems.  No numbness or tingling in feet.   Pt relates some weakness in right leg.

## 2011-12-04 NOTE — ED Provider Notes (Signed)
History     CSN: [id number]  Arrival date & time 12/04/11  1041   First MD Initiated Contact with Patient 12/04/11 1124      Chief Complaint  Patient presents with  . Fall  . Back Pain    (Consider location/radiation/quality/duration/timing/severity/associated sxs/prior treatment) HPI Pt reports she stumbled and fell down wooden steps at her home last night, landing on her R side and hitting her head. Denies LOC, has had severe aching pain in low back and R hip since then, worse with movement but able to ambulate. Not improved with ibuprofen taken at home.  Past Medical History  Diagnosis Date  . Asthma   . Depression   . History of uterine fibroid   . Fibroid   . Ovarian cyst 2013    Past Surgical History  Procedure Date  . Myomectomy abdominal approach 08/06/2007  . Tumor removal   . Uterine fibroid surgery     Family History  Problem Relation Age of Onset  . Colon cancer      uncle/aunt/grandfather  . Ulcerative colitis      cousin  . Crohn's disease      cousin  . Liver disease      great-grandfather, alcoholic  . Heart disease      uncle  . Diabetes      uncle  . Other Neg Hx   . Cancer Paternal Aunt 31    pancreatic   . Cancer Maternal Grandmother 80    breast    History  Substance Use Topics  . Smoking status: Current Every Day Smoker -- 0.5 packs/day    Types: Cigarettes  . Smokeless tobacco: Never Used  . Alcohol Use: Yes     Comment: occasional    OB History    Grav Para Term Preterm Abortions TAB SAB Ect Mult Living   0               Review of Systems All other systems reviewed and are negative except as noted in HPI.   Allergies  Review of patient's allergies indicates no known allergies.  Home Medications   Current Outpatient Rx  Name  Route  Sig  Dispense  Refill  . IBUPROFEN 800 MG PO TABS   Oral   Take 800 mg by mouth every 8 (eight) hours as needed.         . ALBUTEROL SULFATE HFA 108 (90 BASE) MCG/ACT IN AERS  Inhalation   Inhale 2 puffs into the lungs every 6 (six) hours as needed. For shortness of breath and wheezing          . CALCIUM CARBONATE ANTACID 500 MG PO CHEW   Oral   Chew 2 tablets by mouth daily as needed. For heartburn         . FLUOXETINE HCL 20 MG PO CAPS   Oral   Take 1 capsule (20 mg total) by mouth daily.   30 capsule   1   . NORGESTIMATE-ETH ESTRADIOL 0.25-35 MG-MCG PO TABS   Oral   Take 1 tablet by mouth daily.   1 Package   11     BP 135/69  Pulse 98  Temp 98.5 F (36.9 C) (Oral)  Resp 22  Ht 5\' 5"  (1.651 m)  Wt 145 lb (65.772 kg)  BMI 24.13 kg/m2  SpO2 99%  LMP 11/11/2011  Physical Exam  Nursing note and vitals reviewed. Constitutional: She is oriented to person, place, and time. She appears well-developed and  well-nourished.  HENT:  Head: Normocephalic and atraumatic.  Eyes: EOM are normal. Pupils are equal, round, and reactive to light.  Neck: Normal range of motion. Neck supple.  Cardiovascular: Normal rate, normal heart sounds and intact distal pulses.   Pulmonary/Chest: Effort normal and breath sounds normal.  Abdominal: Bowel sounds are normal. She exhibits no distension. There is no tenderness.  Musculoskeletal: She exhibits tenderness. She exhibits no edema.       Large contusion over the R lateral thigh, tender to palpation in the midline lumbar spine and R hip. No cervical spine tenderness  Neurological: She is alert and oriented to person, place, and time. She has normal strength. No cranial nerve deficit or sensory deficit.  Skin: Skin is warm and dry. No rash noted.  Psychiatric: She has a normal mood and affect.    ED Course  Procedures (including critical care time)  Labs Reviewed - No data to display Dg Lumbar Spine Complete  12/04/2011  *RADIOLOGY REPORT*  Clinical Data: Fall down stairs with low back pain.  LUMBAR SPINE - COMPLETE 4+ VIEW  Comparison:  None.  Findings:  There is no evidence of lumbar spine fracture.  Alignment is normal.  Intervertebral disc spaces are maintained.  IMPRESSION: Negative.   Original Report Authenticated By: Irish Lack, M.D.    Dg Hip Complete Right  12/04/2011  *RADIOLOGY REPORT*  Clinical Data: Fall down stairs with right hip pain.  RIGHT HIP - COMPLETE 2+ VIEW  Comparison: None.  Findings: The bony pelvis is intact.  No evidence of acute fracture or dislocation.  No significant degenerative change.  No bony lesions or soft tissue abnormalities.  IMPRESSION: No evidence of acute fracture.   Original Report Authenticated By: Irish Lack, M.D.      No diagnosis found.    MDM  Xrays neg. Pt feeling better and ready to go home.         Charles B. Bernette Mayers, MD 12/04/11 [phone]

## 2012-07-05 ENCOUNTER — Encounter (HOSPITAL_COMMUNITY): Payer: Self-pay | Admitting: Emergency Medicine

## 2012-07-05 ENCOUNTER — Emergency Department (HOSPITAL_COMMUNITY)
Admission: EM | Admit: 2012-07-05 | Discharge: 2012-07-05 | Disposition: A | Discharge status: Home / Self Care | Payer: Self-pay | Attending: Emergency Medicine | Admitting: Emergency Medicine

## 2012-07-05 DIAGNOSIS — M545 Low back pain, unspecified: Secondary | ICD-10-CM | POA: Insufficient documentation

## 2012-07-05 DIAGNOSIS — Z9889 Other specified postprocedural states: Secondary | ICD-10-CM | POA: Insufficient documentation

## 2012-07-05 DIAGNOSIS — Z79899 Other long term (current) drug therapy: Secondary | ICD-10-CM | POA: Insufficient documentation

## 2012-07-05 DIAGNOSIS — N898 Other specified noninflammatory disorders of vagina: Secondary | ICD-10-CM | POA: Insufficient documentation

## 2012-07-05 DIAGNOSIS — F17200 Nicotine dependence, unspecified, uncomplicated: Secondary | ICD-10-CM | POA: Insufficient documentation

## 2012-07-05 DIAGNOSIS — F329 Major depressive disorder, single episode, unspecified: Secondary | ICD-10-CM | POA: Insufficient documentation

## 2012-07-05 DIAGNOSIS — J45909 Unspecified asthma, uncomplicated: Secondary | ICD-10-CM | POA: Insufficient documentation

## 2012-07-05 DIAGNOSIS — N946 Dysmenorrhea, unspecified: Secondary | ICD-10-CM | POA: Insufficient documentation

## 2012-07-05 DIAGNOSIS — F3289 Other specified depressive episodes: Secondary | ICD-10-CM | POA: Insufficient documentation

## 2012-07-05 DIAGNOSIS — Z8742 Personal history of other diseases of the female genital tract: Secondary | ICD-10-CM | POA: Insufficient documentation

## 2012-07-05 DIAGNOSIS — Z3202 Encounter for pregnancy test, result negative: Secondary | ICD-10-CM | POA: Insufficient documentation

## 2012-07-05 LAB — COMPREHENSIVE METABOLIC PANEL
ALT: 6 U/L (ref 0–35)
BUN: 9 mg/dL (ref 6–23)
CO2: 23 mEq/L (ref 19–32)
Calcium: 9.2 mg/dL (ref 8.4–10.5)
GFR calc Af Amer: >90 mL/min (ref >90)
GFR calc non Af Amer: >90 mL/min (ref >90)
Glucose, Bld: 89 mg/dL (ref 70–99)
Sodium: 139 mEq/L (ref 135–145)

## 2012-07-05 LAB — CBC
HCT: 37.9 % (ref 36.0–46.0)
Hemoglobin: 12.8 g/dL (ref 12.0–15.0)
MCH: 30 pg (ref 26.0–34.0)
MCV: 88.8 fL (ref 78.0–100.0)
RBC: 4.27 MIL/uL (ref 3.87–5.11)

## 2012-07-05 LAB — POCT PREGNANCY, URINE: Preg Test, Ur: NEGATIVE

## 2012-07-05 MED ORDER — POTASSIUM CHLORIDE CRYS ER 20 MEQ PO TBCR
20.0000 meq | EXTENDED_RELEASE_TABLET | Freq: Once | ORAL | Status: AC
  Administered 2012-07-05: 20 meq via ORAL
  Filled 2012-07-05: qty 1

## 2012-07-05 MED ORDER — HYDROCODONE-ACETAMINOPHEN 5-325 MG PO TABS
2.0000 | ORAL_TABLET | Freq: Once | ORAL | Status: AC
  Administered 2012-07-05: 2 via ORAL
  Filled 2012-07-05: qty 2

## 2012-07-05 MED ORDER — HYDROCODONE-ACETAMINOPHEN 5-325 MG PO TABS: 1.0000 | ORAL_TABLET | Freq: Four times a day (QID) | ORAL | Status: DC | PRN

## 2012-07-05 NOTE — Progress Notes (Signed)
   CARE MANAGEMENT ED NOTE 07/05/2012  Patient:  Donna Gordon,Donna Gordon   Account Number:  [id number]  Date Initiated:  07/05/2012  Documentation initiated by:  Radford Pax  Subjective/Objective Assessment:     Subjective/Objective Assessment Detail:     Action/Plan:   Action/Plan Detail:   Anticipated DC Date:       Status Recommendation to Physician:   Result of Recommendation:    Other ED Services  Consult Working Plan    DC Planning Services  Other  PCP issues    Choice offered to / List presented to:            Status of service:  Completed, signed off  ED Comments:   ED Comments Detail:  Patient listed as not having insurance or a pcp.  [facility] provided patient with list of providers who will accept self pay patients.  Also infromed  patient of pharmacies that offer discounted prescriptions such as [facility], [facility], [facility] etc.  Also infromed patient to go onto needymeds.org if she needed help affording a medication. [facility]CM alos provided patient phone number and information about the Affordable Care Act and Medicaid for insurance. Patient thankful for services. No further needs at this time.

## 2012-07-05 NOTE — ED Provider Notes (Signed)
History    CSN: [id number] Arrival date & time 07/05/12  2032  First MD Initiated Contact with Patient 07/05/12 2042     Chief Complaint  Patient presents with  . Abdominal Pain   (Consider location/radiation/quality/duration/timing/severity/associated sxs/prior Treatment) Patient is a 32 y.o. female presenting with abdominal pain. The history is provided by the patient.  Abdominal Pain Associated symptoms include abdominal pain. Pertinent negatives include no chest pain, no headaches and no shortness of breath.  pt with hx uterine fibroids presents w bil lower abd/pelvis and low back pain. Constant. Dull. Cramping. Moderate-sev. Non radiating. No specific exacerbating or alleviating factors. Similar to prior pain w periods, but worse. States her period started this morning, normal time, bleeding sl heavy, but states periods tend to have heavy bleeding, hx fibroids. Has used 7-8 tampons today. Denies dysuria or urgency. No vaginal discharge. No fever or chills. Having normal bms. No abd distension or nv.    Past Medical History  Diagnosis Date  . Asthma   . Depression   . History of uterine fibroid   . Fibroid   . Ovarian cyst 2013   Past Surgical History  Procedure Laterality Date  . Myomectomy abdominal approach  08/06/2007  . Tumor removal    . Uterine fibroid surgery     Family History  Problem Relation Age of Onset  . Colon cancer      uncle/aunt/grandfather  . Ulcerative colitis      cousin  . Crohn's disease      cousin  . Liver disease      great-grandfather, alcoholic  . Heart disease      uncle  . Diabetes      uncle  . Other Neg Hx   . Cancer Paternal Aunt 31    pancreatic   . Cancer Maternal Grandmother 73    breast   History  Substance Use Topics  . Smoking status: Current Every Day Smoker -- 0.50 packs/day    Types: Cigarettes  . Smokeless tobacco: Never Used  . Alcohol Use: Yes     Comment: occasional   OB History   Grav Para Term Preterm  Abortions TAB SAB Ect Mult Living   0              Review of Systems  Constitutional: Negative for fever and chills.  HENT: Negative for neck pain.   Eyes: Negative for redness.  Respiratory: Negative for shortness of breath.   Cardiovascular: Negative for chest pain.  Gastrointestinal: Positive for abdominal pain. Negative for vomiting and diarrhea.  Genitourinary: Positive for vaginal bleeding. Negative for dysuria and vaginal discharge.  Musculoskeletal: Positive for back pain.  Skin: Negative for rash.  Neurological: Negative for headaches.  Hematological: Does not bruise/bleed easily.  Psychiatric/Behavioral: Negative for confusion.    Allergies  Review of patient's allergies indicates no known allergies.  Home Medications   Current Outpatient Rx  Name  Route  Sig  Dispense  Refill  . albuterol (PROVENTIL HFA;VENTOLIN HFA) 108 (90 BASE) MCG/ACT inhaler   Inhalation   Inhale 2 puffs into the lungs every 6 (six) hours as needed. For shortness of breath and wheezing          . calcium carbonate (TUMS - DOSED IN MG ELEMENTAL CALCIUM) 500 MG chewable tablet   Oral   Chew 2 tablets by mouth daily as needed. For heartburn         . FLUoxetine (PROZAC) 20 MG capsule   Oral  Take 1 capsule (20 mg total) by mouth daily.   30 capsule   1   . ibuprofen (ADVIL,MOTRIN) 800 MG tablet   Oral   Take 800 mg by mouth every 8 (eight) hours as needed.         . norgestimate-ethinyl estradiol (ORTHO-CYCLEN,SPRINTEC,PREVIFEM) 0.25-35 MG-MCG tablet   Oral   Take 1 tablet by mouth daily.   1 Package   11   . oxyCODONE-acetaminophen (PERCOCET/ROXICET) 5-325 MG per tablet   Oral   Take 1-2 tablets by mouth every 6 (six) hours as needed for pain.   20 tablet   0    BP 133/85  Pulse 76  Temp(Src) 98.5 F (36.9 C) (Oral)  Resp 20  SpO2 100%  LMP 07/05/2012 Physical Exam  Nursing note and vitals reviewed. Constitutional: She appears well-developed and well-nourished. No  distress.  HENT:  Nose: Nose normal.  Mouth/Throat: Oropharynx is clear and moist.  Eyes: Conjunctivae are normal. No scleral icterus.  Neck: Neck supple. No tracheal deviation present.  Cardiovascular: Normal rate.   Pulmonary/Chest: Effort normal. No respiratory distress.  Abdominal: Soft. Normal appearance and bowel sounds are normal. She exhibits no distension and no mass. There is no tenderness. There is no rebound and no guarding.  Genitourinary:  No cva tenderness  Musculoskeletal: She exhibits no edema.  tls spine non tender  Neurological: She is alert.  Skin: Skin is warm and dry. No rash noted. No pallor.  Psychiatric: She has a normal mood and affect.    ED Course  Procedures (including critical care time)  Results for orders placed during the hospital encounter of 07/05/12  CBC      Result Value Range   WBC 9.4  4.0 - 10.5 K/uL   RBC 4.27  3.87 - 5.11 MIL/uL   Hemoglobin 12.8  12.0 - 15.0 g/dL   HCT 16.1  09.6 - 04.5 %   MCV 88.8  78.0 - 100.0 fL   MCH 30.0  26.0 - 34.0 pg   MCHC 33.8  30.0 - 36.0 g/dL   RDW 40.9  81.1 - 91.4 %   Platelets 264  150 - 400 K/uL  COMPREHENSIVE METABOLIC PANEL      Result Value Range   Sodium 139  135 - 145 mEq/L   Potassium 3.3 (*) 3.5 - 5.1 mEq/L   Chloride 106  96 - 112 mEq/L   CO2 23  19 - 32 mEq/L   Glucose, Bld 89  70 - 99 mg/dL   BUN 9  6 - 23 mg/dL   Creatinine, Ser 7.82  0.50 - 1.10 mg/dL   Calcium 9.2  8.4 - 95.6 mg/dL   Total Protein 6.9  6.0 - 8.3 g/dL   Albumin 3.8  3.5 - 5.2 g/dL   AST 11  0 - 37 U/L   ALT 6  0 - 35 U/L   Alkaline Phosphatase 70  39 - 117 U/L   Total Bilirubin 0.2 (*) 0.3 - 1.2 mg/dL   GFR calc non Af Amer >90  >90 mL/min   GFR calc Af Amer >90  >90 mL/min  POCT PREGNANCY, URINE      Result Value Range   Preg Test, Ur NEGATIVE  NEGATIVE     MDM  Labs. vicodin po - pt has ride, does not have to drive.   Reviewed nursing notes and prior charts for additional history.   kcl  po.  Recheck pt comfortable, nad. abd soft nt.  Pt appears stable for d/c.   Suzi Roots, MD 07/05/12 2219

## 2012-07-05 NOTE — ED Notes (Signed)
Pt states she has bad abnormal periods but this morning about 330 she started her period and she states since this morning she has been passing clots and having very heavy flow  Pt states she feels weak and this is not like her normal periods  Pt states she went through 8 super tampons at work and has been through two since she has been home

## 2012-08-06 ENCOUNTER — Encounter: Payer: Self-pay | Admitting: Obstetrics and Gynecology

## 2012-08-06 ENCOUNTER — Ambulatory Visit (INDEPENDENT_AMBULATORY_CARE_PROVIDER_SITE_OTHER): Payer: Self-pay | Admitting: Obstetrics and Gynecology

## 2012-08-06 VITALS — BP 119/75 | HR 85 | Temp 97.3°F | Ht 65.0 in | Wt 131.9 lb

## 2012-08-06 DIAGNOSIS — Z3202 Encounter for pregnancy test, result negative: Secondary | ICD-10-CM

## 2012-08-06 DIAGNOSIS — Z309 Encounter for contraceptive management, unspecified: Secondary | ICD-10-CM

## 2012-08-06 DIAGNOSIS — N938 Other specified abnormal uterine and vaginal bleeding: Secondary | ICD-10-CM

## 2012-08-06 DIAGNOSIS — N949 Unspecified condition associated with female genital organs and menstrual cycle: Secondary | ICD-10-CM

## 2012-08-06 DIAGNOSIS — Z3200 Encounter for pregnancy test, result unknown: Secondary | ICD-10-CM

## 2012-08-06 DIAGNOSIS — N920 Excessive and frequent menstruation with regular cycle: Secondary | ICD-10-CM

## 2012-08-06 MED ORDER — LEVONORGEST-ETH ESTRAD 91-DAY 0.15-0.03 MG PO TABS: 1.0000 | ORAL_TABLET | Freq: Every day | ORAL | Status: DC

## 2012-08-06 NOTE — Progress Notes (Signed)
Subjective:     Patient ID: Drake Leach, female   DOB: [birth year], 32 y.o.   MRN: [id number]  HPI 32 y.o. F G0 started periods at 60. Never been regular but usually 25-30d cycles. Never misses a cycle. Bleeding 5 to 9. Associated with severe pain, and occassional light headedness worse on day 1 and 2. Reports 3-4 max absorb tampons or 5-6 heavy pads usually because soaked.    Review of Systems  Constitutional: Negative for fever, chills, diaphoresis, activity change, appetite change and fatigue.  Respiratory: Negative for apnea, chest tightness and shortness of breath.   Cardiovascular: Negative for chest pain.  Genitourinary: Positive for menstrual problem. Negative for urgency, decreased urine volume, vaginal bleeding, vaginal discharge, vaginal pain and pelvic pain.       Objective:   Physical Exam  Constitutional: She appears well-developed and well-nourished. No distress.  HENT:  Head: Normocephalic and atraumatic.  Cardiovascular: Normal rate, regular rhythm and normal heart sounds.   Pulmonary/Chest: Effort normal and breath sounds normal. No respiratory distress. She has no wheezes. She has no rales. She exhibits no tenderness.  Skin: She is not diaphoretic.       Assessment:     Cherelle Midkiff is a 32 y.o. G0 presents with functional uterine bleeding consistent with mennorhagia.      Plan:     #mennorrhagia: Pt complaint most consistent with heavy functional uterine bleeding. Pt has had better control with mirena previously. Pt requests to utilize OCPs to attempt control. As appears to be functional uterine bleeding, will not evaluate more aggressively at this time. If no improvement of symptoms will work up for bleeding disorder. No active symptoms of anemia and will not order cbc today. Further work up to include CBC, and TSH  Rx for [facility] for initial control.

## 2012-08-14 ENCOUNTER — Telehealth: Payer: Self-pay | Admitting: *Deleted

## 2012-08-14 DIAGNOSIS — Z309 Encounter for contraceptive management, unspecified: Secondary | ICD-10-CM

## 2012-08-14 NOTE — Telephone Encounter (Addendum)
Pt left VM message stating that she would like an alternate Rx for birth control as she has no insurance and cannot afford the Rx which was given on 7/28.  I returned pt's call and informed her that I will discuss with Dr. Ike Bene and call her back within the next [place] or 2.  Pt voiced understanding.    8/6  1620  Attempted to call pt with information regarding updated Rx from Dr. Ike Bene- was not able to leave message on home # and mobile # has been disconnected. Pt needs to be informed that the new Rx is on the $9 list at [facility] but is $35 at her [facility]. She may have the Rx transferred if desired.

## 2012-08-15 MED ORDER — LEVONORGESTREL-ETHINYL ESTRAD 0.1-20 MG-MCG PO TABS: 1.0000 | ORAL_TABLET | Freq: Every day | ORAL | Status: DC

## 2012-08-15 NOTE — Telephone Encounter (Signed)
Changed Rx may require transfer [facility] to [facility]. But changed to comply with 9$ list at [facility]. Sorry for confusion.  MRO

## 2012-08-15 NOTE — Telephone Encounter (Signed)
Reviewed [facility] 9$ list and changed medication to comply with list. Sent to patients usual pharmacy. May transfer if needed for cheaper price.  Please call and inform pt

## 2012-08-16 NOTE — Telephone Encounter (Signed)
Called patient and message stated this person isn't accepting calls at the time.

## 2012-08-20 NOTE — Telephone Encounter (Signed)
Called pt and was unable to leave message.  Sent certified letter.

## 2012-08-24 ENCOUNTER — Telehealth: Payer: Self-pay | Admitting: *Deleted

## 2012-08-29 NOTE — Telephone Encounter (Signed)
Erroneous encounter

## 2012-09-12 ENCOUNTER — Encounter: Payer: Self-pay | Admitting: *Deleted

## 2012-10-23 ENCOUNTER — Emergency Department (HOSPITAL_COMMUNITY)
Admission: EM | Admit: 2012-10-23 | Discharge: 2012-10-23 | Disposition: A | Discharge status: Home / Self Care | Payer: Self-pay | Attending: Emergency Medicine | Admitting: Emergency Medicine

## 2012-10-23 ENCOUNTER — Encounter (HOSPITAL_COMMUNITY): Payer: Self-pay | Admitting: Emergency Medicine

## 2012-10-23 DIAGNOSIS — Z720 Tobacco use: Secondary | ICD-10-CM

## 2012-10-23 DIAGNOSIS — R079 Chest pain, unspecified: Secondary | ICD-10-CM | POA: Insufficient documentation

## 2012-10-23 DIAGNOSIS — J45909 Unspecified asthma, uncomplicated: Secondary | ICD-10-CM | POA: Insufficient documentation

## 2012-10-23 DIAGNOSIS — F17200 Nicotine dependence, unspecified, uncomplicated: Secondary | ICD-10-CM | POA: Insufficient documentation

## 2012-10-23 DIAGNOSIS — Z8742 Personal history of other diseases of the female genital tract: Secondary | ICD-10-CM | POA: Insufficient documentation

## 2012-10-23 DIAGNOSIS — Z8659 Personal history of other mental and behavioral disorders: Secondary | ICD-10-CM | POA: Insufficient documentation

## 2012-10-23 DIAGNOSIS — J40 Bronchitis, not specified as acute or chronic: Secondary | ICD-10-CM

## 2012-10-23 MED ORDER — AZITHROMYCIN 250 MG PO TABS: 250.0000 mg | ORAL_TABLET | Freq: Every day | ORAL | Status: DC

## 2012-10-23 MED ORDER — HYDROCODONE-ACETAMINOPHEN 7.5-325 MG/15ML PO SOLN: 10.0000 mL | Freq: Four times a day (QID) | ORAL | Status: DC | PRN

## 2012-10-23 NOTE — Progress Notes (Signed)
[facility] provided pt with a list of primary care resources and a [facility] [employer] application.  °

## 2012-10-23 NOTE — ED Provider Notes (Signed)
CSN: [id number]     Arrival date & time 10/23/12  1141 History   First MD Initiated Contact with Patient 10/23/12 1147     Chief Complaint  Patient presents with  . Cough    Productive cough  . Headache  . Nasal Congestion  . Chest Pain    cough and congestion causing chest pain.    (Consider location/radiation/quality/duration/timing/severity/associated sxs/prior Treatment) HPI Comments: Patient presents with 4 day history of productive cough, burning sensation in chest with deep breathing, chills, arthralgias, decreased appetitive, and sore throat. Cough is productive of thick green sputum with streaks of blood. Symptoms have been worsening. Albuterol inhaler and OTC meds have provided no relief.   Patient is a 32 y.o. female presenting with cough, headaches, and chest pain. The history is provided by the patient.  Cough Cough characteristics:  Productive Sputum characteristics:  Green, bloody and yellow (blood sreaked) Severity:  Moderate Onset quality:  Gradual Timing:  Intermittent Progression:  Worsening Smoker: yes   Context: smoke exposure   Ineffective treatments:  Beta-agonist inhaler Associated symptoms: chest pain and headaches   Chest pain:    Quality:  Burning (burning sensation with deep breathing)   Severity:  Mild   Onset quality:  Gradual   Timing:  Intermittent   Progression:  Worsening   Chronicity:  New Headaches:    Severity:  Moderate   Onset quality:  Gradual   Progression:  Worsening   Chronicity:  New Headache Associated symptoms: cough   Associated symptoms: no diarrhea, no nausea and no vomiting   Chest Pain Associated symptoms: cough and headache   Associated symptoms: no nausea and not vomiting     Past Medical History  Diagnosis Date  . Asthma   . Depression   . History of uterine fibroid   . Fibroid   . Ovarian cyst 2013   Past Surgical History  Procedure Laterality Date  . Myomectomy abdominal approach  08/06/2007  . Tumor  removal    . Uterine fibroid surgery     Family History  Problem Relation Age of Onset  . Colon cancer      uncle/aunt/grandfather  . Ulcerative colitis      cousin  . Crohn's disease      cousin  . Liver disease      great-grandfather, alcoholic  . Heart disease      uncle  . Diabetes      uncle  . Other Neg Hx   . Cancer Paternal Aunt 31    pancreatic   . Cancer Maternal Grandmother 71    breast   History  Substance Use Topics  . Smoking status: Current Every Day Smoker -- 0.50 packs/day    Types: Cigarettes  . Smokeless tobacco: Never Used  . Alcohol Use: Yes     Comment: occasional   OB History   Grav Para Term Preterm Abortions TAB SAB Ect Mult Living   0              Review of Systems  Respiratory: Positive for cough.   Cardiovascular: Positive for chest pain.  Gastrointestinal: Negative for nausea, vomiting, diarrhea and constipation.  Musculoskeletal: Positive for arthralgias.  Neurological: Positive for headaches.    Allergies  Review of patient's allergies indicates no known allergies.  Home Medications   Current Outpatient Rx  Name  Route  Sig  Dispense  Refill  . acetaminophen (TYLENOL) 325 MG tablet   Oral   Take 650 mg by mouth  every 6 (six) hours as needed for pain (headache).         Marland Kitchen ibuprofen (ADVIL,MOTRIN) 200 MG tablet   Oral   Take 800 mg by mouth every 6 (six) hours as needed for pain.          BP 130/85  Pulse 98  Temp(Src) 98.4 F (36.9 C) (Oral)  SpO2 98%  LMP 10/20/2012 Physical Exam  Constitutional: She is oriented to person, place, and time. She appears well-developed and well-nourished. No distress.  HENT:  Head: Normocephalic and atraumatic.  Mouth/Throat: Oropharynx is clear and moist. No oropharyngeal exudate.  Eyes: Conjunctivae and EOM are normal. Pupils are equal, round, and reactive to light. Right eye exhibits no discharge. Left eye exhibits no discharge.  Neck: Normal range of motion.  Cardiovascular:  Normal rate, regular rhythm and normal heart sounds.   No murmur heard. Pulmonary/Chest: Effort normal and breath sounds normal. She has no wheezes. She has no rales. She exhibits no tenderness.  Abdominal: Soft. Bowel sounds are normal. She exhibits no distension. There is no tenderness. There is no rebound and no guarding.  Lymphadenopathy:    She has no cervical adenopathy.  Neurological: She is alert and oriented to person, place, and time.  Skin: Skin is warm and dry. No rash noted. She is not diaphoretic.  Psychiatric: She has a normal mood and affect.    ED Course  Procedures (including critical care time) Labs Review Labs Reviewed - No data to display Imaging Review No results found.  EKG Interpretation   None       MDM  No diagnosis found. 1. Bronchitis 2. Tobacco abuse  Uncomplicated URI, probably viral but at risk as smoker. Will cover with abx.    Arnoldo Hooker, PA-C 10/23/12 1220

## 2012-10-23 NOTE — ED Notes (Signed)
Pt reports a 4 day history of chills, productive cough, headache and chest wall pain-burning sensation. Pain unresponsive to OTC meds Cough producing thick green sputum,with streaks of blood

## 2012-10-27 NOTE — ED Provider Notes (Signed)
Medical screening examination/treatment/procedure(s) were performed by non-physician practitioner and as supervising physician I was immediately available for consultation/collaboration.  Donnetta Hutching, MD 10/27/12 [phone]

## 2013-05-14 ENCOUNTER — Telehealth: Payer: Self-pay

## 2013-05-14 NOTE — Telephone Encounter (Signed)
Pt. Called stating she has severe cramping, legs are weak, her back and legs are hurting more so than usual. She has tried motrin, heading pads and warm baths and nothing seems to be helping the pain. Would like a call back. Called pt. Pt. States "Im not supposed to start my period for two weeks. But for the last two days the cramping has been really painful, I'm tired and my back hurts." Pt. States her periods are always painful and like this but she is tired and sick of it. She is not on birth control because it was too expensive for her when it was last prescribed. Informed pt. That she may take up to 800mg  q4-6 hours for the cramping and lower back pain, continue with warm/cold packs and informed her we will get her an appointment to meet with a provider to discuss painful periods; informed her it may be best to get back on a pill ($4 at [facility]) to help with cramping and regulate period. Advised pt. That if pain becomes so severe she cant handle it she should go to [facility] to be evaluated, however, explained that there is not much we can do until she sees a provider as she has not been seen in over a year. Pt. Verbalized understanding and gratitude and will await to hear from front office with appointment.

## 2013-06-13 ENCOUNTER — Ambulatory Visit (INDEPENDENT_AMBULATORY_CARE_PROVIDER_SITE_OTHER): Payer: Self-pay | Admitting: Family Medicine

## 2013-06-13 ENCOUNTER — Encounter: Payer: Self-pay | Admitting: Family Medicine

## 2013-06-13 VITALS — BP 114/81 | HR 82 | Temp 97.8°F | Ht 65.0 in | Wt 133.1 lb

## 2013-06-13 DIAGNOSIS — Z309 Encounter for contraceptive management, unspecified: Secondary | ICD-10-CM

## 2013-06-13 NOTE — Assessment & Plan Note (Signed)
Trial of OC's.  Discussed possibility continuous OC's.  Possibility of endometriosis also discussed. Use Sprintec because it is on the $4 list at [facility].

## 2013-06-13 NOTE — Progress Notes (Signed)
Phone number given for free pap screening.

## 2013-06-13 NOTE — Progress Notes (Signed)
    Subjective:    Patient ID: Donna Gordon is a 33 y.o. female presenting with Gynecologic Exam  on 06/13/2013  HPI: Reports significant painful cycles.  Worse since off birth control for some period of time.  She is having moodiness and pain. Cycles are somewhat irregular and last for 5-9 days at a time. Nulliparous pt. Does not desire children at this time, but uninterested in permanent treatment options.  Review of Systems  Constitutional: Negative for fever and chills.  Respiratory: Negative for shortness of breath.   Cardiovascular: Negative for chest pain.  Genitourinary: Positive for vaginal bleeding, menstrual problem and pelvic pain.      Objective:    BP 114/81  Pulse 82  Temp(Src) 97.8 F (36.6 C)  Ht 5\' 5"  (1.651 m)  Wt 133 lb 1.6 oz (60.374 kg)  BMI 22.15 kg/m2  LMP 06/09/2013 Physical Exam  Vitals reviewed. Constitutional: She appears well-developed and well-nourished.  Neck: Neck supple.  Cardiovascular: Normal rate.   Pulmonary/Chest: Effort normal. Right breast exhibits no inverted nipple and no mass. Left breast exhibits no inverted nipple and no mass.  Abdominal: Soft.  Genitourinary:  NEFG, BUS is WNL, vagina is pink and ruggated, cervix is nulliparous and without lesion, uterus is NSSC, non-tender, no adnexal mass or tenderness.        Assessment & Plan:   Problem List Items Addressed This Visit     Unprioritized   Contraception management - Primary     Trial of OC's.  Discussed possibility continuous OC's.  Possibility of endometriosis also discussed. Use Sprintec because it is on the $4 list at [facility].       Return in about 3 months (around 09/13/2013) for a follow-up.

## 2013-06-13 NOTE — Patient Instructions (Signed)

## 2013-06-17 ENCOUNTER — Telehealth: Payer: Self-pay | Admitting: *Deleted

## 2013-06-17 DIAGNOSIS — Z3049 Encounter for surveillance of other contraceptives: Secondary | ICD-10-CM

## 2013-06-17 MED ORDER — NORGESTIMATE-ETH ESTRADIOL 0.25-35 MG-MCG PO TABS: 1.0000 | ORAL_TABLET | Freq: Every day | ORAL | Status: DC

## 2013-06-17 NOTE — Telephone Encounter (Signed)
Donna Gordon called and reports she had an appointment last week and was prescribed birth control but it is not at her pharmacy. Reviewed chart and called [facility] . Informed her did see plan of care to include birth control Sprintec- she states she has been sexuallly active but used condoms. Advised her to take home urine pregnancy test and if negative may start pills on Sunday or wait until after next period. Patient states does not remember discussing whether to take continuous ocp's  Or taking placebo week. Infomred her I would discuss with Doctor and then send prescription.   Called Dr. Shawnie Pons and she states wants patient to take pills - all 4 weeks including placebo, if that does not help her pain- may take continuous ocp's starting with 2nd pack pills. Called [facility]anna and left message I had more information for her- please call clinic.

## 2013-06-18 NOTE — Telephone Encounter (Signed)
Called patient, no answer- left message that we are trying to reach you with some additional information, please call us back at the clinics

## 2013-06-19 NOTE — Telephone Encounter (Signed)
Called Donna Gordon back and she states has not called back because of crazy work hours. We discussed Dr. Tawni Levy reccomendations and also needs to make appt for 3 month follow up to see how ocp's are working.  She will call  In July or August for Sept appt  Or call us if any problems,

## 2013-07-16 ENCOUNTER — Emergency Department (HOSPITAL_COMMUNITY): Payer: Self-pay

## 2013-07-16 ENCOUNTER — Encounter (HOSPITAL_COMMUNITY): Payer: Self-pay | Admitting: Emergency Medicine

## 2013-07-16 ENCOUNTER — Emergency Department (HOSPITAL_COMMUNITY)
Admission: EM | Admit: 2013-07-16 | Discharge: 2013-07-16 | Disposition: A | Discharge status: Home / Self Care | Payer: Self-pay | Attending: Emergency Medicine | Admitting: Emergency Medicine

## 2013-07-16 DIAGNOSIS — F17200 Nicotine dependence, unspecified, uncomplicated: Secondary | ICD-10-CM | POA: Insufficient documentation

## 2013-07-16 DIAGNOSIS — N760 Acute vaginitis: Secondary | ICD-10-CM | POA: Insufficient documentation

## 2013-07-16 DIAGNOSIS — R209 Unspecified disturbances of skin sensation: Secondary | ICD-10-CM | POA: Insufficient documentation

## 2013-07-16 DIAGNOSIS — Z3202 Encounter for pregnancy test, result negative: Secondary | ICD-10-CM | POA: Insufficient documentation

## 2013-07-16 DIAGNOSIS — A499 Bacterial infection, unspecified: Secondary | ICD-10-CM | POA: Insufficient documentation

## 2013-07-16 DIAGNOSIS — Z8659 Personal history of other mental and behavioral disorders: Secondary | ICD-10-CM | POA: Insufficient documentation

## 2013-07-16 DIAGNOSIS — Z79899 Other long term (current) drug therapy: Secondary | ICD-10-CM | POA: Insufficient documentation

## 2013-07-16 DIAGNOSIS — R339 Retention of urine, unspecified: Secondary | ICD-10-CM | POA: Insufficient documentation

## 2013-07-16 DIAGNOSIS — J45909 Unspecified asthma, uncomplicated: Secondary | ICD-10-CM | POA: Insufficient documentation

## 2013-07-16 DIAGNOSIS — B9689 Other specified bacterial agents as the cause of diseases classified elsewhere: Secondary | ICD-10-CM | POA: Insufficient documentation

## 2013-07-16 LAB — WET PREP, GENITAL
Trich, Wet Prep: NONE SEEN
WBC WET PREP: NONE SEEN
Yeast Wet Prep HPF POC: NONE SEEN

## 2013-07-16 LAB — URINALYSIS, ROUTINE W REFLEX MICROSCOPIC
BILIRUBIN URINE: NEGATIVE
Glucose, UA: NEGATIVE mg/dL
KETONES UR: NEGATIVE mg/dL
LEUKOCYTES UA: NEGATIVE
Nitrite: NEGATIVE
PH: 5 (ref 5.0–8.0)
PROTEIN: NEGATIVE mg/dL
SPECIFIC GRAVITY, URINE: 1.025 (ref 1.005–1.030)
UROBILINOGEN UA: 0.2 mg/dL (ref 0.0–1.0)

## 2013-07-16 LAB — URINE MICROSCOPIC-ADD ON

## 2013-07-16 LAB — POC URINE PREG, ED: Preg Test, Ur: NEGATIVE

## 2013-07-16 MED ORDER — METRONIDAZOLE 500 MG PO TABS: 500.0000 mg | ORAL_TABLET | Freq: Two times a day (BID) | ORAL | Status: DC

## 2013-07-16 NOTE — Progress Notes (Signed)
[facility] provided pt with a list of primary care resources and a [facility] [employer] Card application to help patient establish primary care.  °

## 2013-07-16 NOTE — ED Notes (Signed)
Initial Contact - pt to RM1, changed to hospital gown, reports c/o lower abd pressure since this AM.  Pt also reports low back pain, numbness in lower legs and difficulty emptying bladder.  Pt denies b/b incontinence.  Denies fevers/chills.  Skin PWD.  MAEI, ambulatory with steady gait.  A+Ox4.  NAD.

## 2013-07-16 NOTE — Discharge Instructions (Signed)
Acute Urinary Retention Acute urinary retention is the temporary inability to urinate. This is an uncommon problem in women. It can be caused by:  Infection.  A side effect of a medicine.  A problem in a nearby organ that presses or squeezes on the bladder or the urethra (the tube that drains the bladder).  Psychological problems.   Surgery on your bladder, urethra, or pelvic organs that causes obstruction to the outflow of urine from your bladder. HOME CARE INSTRUCTIONS  If you are sent home with a Foley catheter and a drainage system, you will need to discuss the best course of action with your health care provider. While the catheter is in, maintain a good intake of fluids. Keep the drainage bag emptied and lower than your catheter. This is so that contaminated urine will not flow back into your bladder, which could lead to a urinary tract infection. There are two main types of drainage bags. One is a large bag that usually is used at night. It has a good capacity that will allow you to sleep through the night without having to empty it. The second type is called a leg bag. It has a smaller capacity so it needs to be emptied more frequently. However, the main advantage is that it can be attached by a leg strap and goes underneath your clothing, allowing you the freedom to move about or leave your home. Only take over-the-counter or prescription medicines for pain, discomfort, or fever as directed by your health care provider.  SEEK MEDICAL CARE IF:  You develop a low-grade fever.  You experience spasms or leakage of urine with the spasms. SEEK IMMEDIATE MEDICAL CARE IF:   You develop chills or fever.  Your catheter stops draining urine.  Your catheter falls out.  You start to develop increased bleeding that does not respond to rest and increased fluid intake. MAKE SURE YOU:  Understand these instructions.  Will watch your condition.  Will get help right away if you are not  doing well or get worse. Document Released: 12/26/2005 Document Revised: 10/17/2012 Document Reviewed: 06/07/2012 [facility] Centers[facility] Patient Information 2015 [place], [place]. This information is not intended to replace advice given to you by your health care provider. Make sure you discuss any questions you have with your health care provider.  Bacterial Vaginosis Bacterial vaginosis is a vaginal infection that occurs when the normal balance of bacteria in the vagina is disrupted. It results from an overgrowth of certain bacteria. This is the most common vaginal infection in women of childbearing age. Treatment is important to prevent complications, especially in pregnant women, as it can cause a premature delivery. CAUSES  Bacterial vaginosis is caused by an increase in harmful bacteria that are normally present in smaller amounts in the vagina. Several different kinds of bacteria can cause bacterial vaginosis. However, the reason that the condition develops is not fully understood. RISK FACTORS Certain activities or behaviors can put you at an increased risk of developing bacterial vaginosis, including:  Having a new sex partner or multiple sex partners.  Douching.  Using an intrauterine device (IUD) for contraception. Women do not get bacterial vaginosis from toilet seats, bedding, swimming pools, or contact with objects around them. SIGNS AND SYMPTOMS  Some women with bacterial vaginosis have no signs or symptoms. Common symptoms include:  Grey vaginal discharge.  A fishlike odor with discharge, especially after sexual intercourse.  Itching or burning of the vagina and vulva.  Burning or pain with urination. DIAGNOSIS  Your health care provider will take a medical history and examine the vagina for signs of bacterial vaginosis. A sample of vaginal fluid may be taken. Your health care provider will look at this sample under a microscope to check for bacteria and abnormal cells. A vaginal pH test  may also be done.  TREATMENT  Bacterial vaginosis may be treated with antibiotic medicines. These may be given in the form of a pill or a vaginal cream. A second round of antibiotics may be prescribed if the condition comes back after treatment.  HOME CARE INSTRUCTIONS   Only take over-the-counter or prescription medicines as directed by your health care provider.  If antibiotic medicine was prescribed, take it as directed. Make sure you finish it even if you start to feel better.  Do not have sex until treatment is completed.  Tell all sexual partners that you have a vaginal infection. They should see their health care provider and be treated if they have problems, such as a mild rash or itching.  Practice safe sex by using condoms and only having one sex partner. SEEK MEDICAL CARE IF:   Your symptoms are not improving after 3 days of treatment.  You have increased discharge or pain.  You have a fever. MAKE SURE YOU:   Understand these instructions.  Will watch your condition.  Will get help right away if you are not doing well or get worse. FOR MORE INFORMATION  Centers for Disease Control and Prevention, Division of STD Prevention: SolutionApps.co.zawww.cdc.gov/std American Sexual Health Association (ASHA): www.ashastd.org  Document Released: 12/27/2004 Document Revised: 10/17/2012 Document Reviewed: 08/08/2012 [facility]ExitCare Patient Information 2015 [place]Care, [place]. This information is not intended to replace advice given to you by your health care provider. Make sure you discuss any questions you have with your health care provider.

## 2013-07-16 NOTE — ED Notes (Signed)
Pt ret from MRI, resting on stretcher, denies needs/complaints.  NAD.

## 2013-07-16 NOTE — ED Notes (Signed)
Post void residual = 63ml.  Pt set up for pelvic exam.  Dr. Elesa MassedWard aware.

## 2013-07-16 NOTE — ED Provider Notes (Signed)
TIME SEEN: 1:00 PM  CHIEF COMPLAINT: Urinary retention, lower back pain, tingling in her legs  HPI: Patient is a 33 y.o. F with history of asthma, depression, uterine fibroids, ovarian cysts who presents emergency department with urinary retention that started this morning, lower back pain and tingling in her anterior thighs that is slowly improving. No other numbness or focal weakness. No history of back injury. No fever. No abdominal pain. No nausea, vomiting or diarrhea. She states she has had vaginal discharge for 4 days. She is sexually active with one partner. Her last menstrual period was Friday, 4 days ago. Denies any new medications.  ROS: See HPI Constitutional: no fever  Eyes: no drainage  ENT: no runny nose   Cardiovascular:  no chest pain  Resp: no SOB  GI: no vomiting GU: no dysuria Integumentary: no rash  Allergy: no hives  Musculoskeletal: no leg swelling  Neurological: no slurred speech ROS otherwise negative  PAST MEDICAL HISTORY/PAST SURGICAL HISTORY:  Past Medical History  Diagnosis Date  . Asthma   . Depression   . History of uterine fibroid   . Fibroid   . Ovarian cyst 2013    MEDICATIONS:  Prior to Admission medications   Medication Sig Start Date End Date Taking? Authorizing Provider  albuterol (PROVENTIL HFA;VENTOLIN HFA) 108 (90 BASE) MCG/ACT inhaler Inhale 2 puffs into the lungs every 6 (six) hours as needed for wheezing or shortness of breath.   Yes Historical Provider, MD  ibuprofen (ADVIL,MOTRIN) 200 MG tablet Take 600 mg by mouth every 6 (six) hours as needed for moderate pain.    Yes Historical Provider, MD  norgestimate-ethinyl estradiol (ORTHO-CYCLEN,SPRINTEC,PREVIFEM) 0.25-35 MG-MCG tablet Take 1 tablet by mouth every morning.   Yes Historical Provider, MD  metroNIDAZOLE (FLAGYL) 500 MG tablet Take 1 tablet (500 mg total) by mouth 2 (two) times daily. 07/16/13   Kristen N Ward, DO    ALLERGIES:  No Known Allergies  SOCIAL HISTORY:  History   Substance Use Topics  . Smoking status: Current Every Day Smoker -- 0.50 packs/day    Types: Cigarettes  . Smokeless tobacco: Never Used  . Alcohol Use: Yes     Comment: occasional    FAMILY HISTORY: Family History  Problem Relation Age of Onset  . Colon cancer      uncle/aunt/grandfather  . Ulcerative colitis      cousin  . Crohn's disease      cousin  . Liver disease      great-grandfather, alcoholic  . Heart disease      uncle  . Diabetes      uncle  . Other Neg Hx   . Cancer Paternal Aunt 31    pancreatic   . Cancer Maternal Grandmother [id number]    breast    EXAM: BP 113/74  Pulse 95  Temp(Src) 98.3 F (36.8 C) (Oral)  Resp 18  SpO2 99%  LMP 07/12/2013 CONSTITUTIONAL: Alert and oriented and responds appropriately to questions. Well-appearing; well-nourished HEAD: Normocephalic EYES: Conjunctivae clear, PERRL ENT: normal nose; no rhinorrhea; moist mucous membranes; pharynx without lesions noted NECK: Supple, no meningismus, no LAD  CARD: RRR; S1 and S2 appreciated; no murmurs, no clicks, no rubs, no gallops RESP: Normal chest excursion without splinting or tachypnea; breath sounds clear and equal bilaterally; no wheezes, no rhonchi, no rales,  ABD/GI: Normal bowel sounds; non-distended; soft, non-tender, no rebound, no guarding GU:  Patient has a small amount of white vaginal discharge, no vaginal bleeding, no cervical motion  tenderness or adnexal tenderness or fullness, normal external genitalia BACK:  The back appears normal; there is no CVA tenderness, no midline spinal tenderness or step-off or deformity, patient does have bilateral lumbar paraspinal tenderness EXT: Normal ROM in all joints; non-tender to palpation; no edema; normal capillary refill; no cyanosis    SKIN: Normal color for age and race; warm NEURO: Moves all extremities equally, patient has some mild decreased sensation in her anterior thighs bilaterally, strength 5/5 in all 4 extremities, 2+  bilateral patellar and Achilles deep tendon reflexes, no clonus, normal gait PSYCH: The patient's mood and manner are appropriate. Grooming and personal hygiene are appropriate.  MEDICAL DECISION MAKING: Patient here with symptoms of anterior leg tingling without obvious anesthesia or perineal numbness, urinary retention and back pain. Differential diagnosis includes UTI, PID, pyelonephritis. Also concerned however for cauda equina given her lower extremity paresthesias. Will obtain urinalysis, pelvic exam with cultures. That workup is unremarkable, I feel she will need an MRI of her lumbar spine. Her postvoid residual is 67 mL's.  ED PROGRESS: Patient's urine shows small hemoglobin but no other sign of infection. She has no CVA tenderness doesn't appear to be uncomfortable and her exam is not consistent with a kidney stone. No prior history of kidney stone. Wet prep is positive for clue cells. We'll treat with Flagyl. We'll obtain an MRI of her lumbar spine to rule out cauda equina.   MRI of her lumbar spine is completely normal. Discussed with patient that she may need urodynamic studies if her symptoms continue. We'll give urology followup information. She is able to void on her own currently. We'll treat her for bacterial vaginosis with Flagyl. Discussed with patient I recommend drinking plenty of fluids, Azo over-the-counter as needed for discomfort. Discussed strict return precautions and supportive care instructions. Patient verbalizes understanding and is comfortable with plan.     Layla MawKristen N Ward, DO 07/16/13 1735

## 2013-07-16 NOTE — ED Notes (Signed)
Pt states last night started having lower abd pain, lower back pain and trouble urinating. Pt denies blood in urine but does have a strong odor.

## 2013-07-16 NOTE — ED Notes (Signed)
Pt to MRI

## 2013-07-18 LAB — URINE CULTURE: Colony Count: 10000

## 2013-12-18 ENCOUNTER — Telehealth: Payer: Self-pay

## 2013-12-18 NOTE — Telephone Encounter (Signed)
Pt called and stated that she has been taking OCP's for 6 months and has now started to have bleeding for past 14 days and she has not missed a pill and she takes it on time, is this normal? Called pt and left message that we are returning her call to please give the office a call.

## 2013-12-24 NOTE — Telephone Encounter (Signed)
Called patient and left message that we are returning her call, please call and leave a new detailed message if you still have concerns.

## 2014-01-17 ENCOUNTER — Inpatient Hospital Stay (HOSPITAL_COMMUNITY)
Admission: AD | Admit: 2014-01-17 | Discharge: 2014-01-17 | Disposition: A | Discharge status: Home / Self Care | Payer: Self-pay | Source: Ambulatory Visit | Attending: Obstetrics & Gynecology | Admitting: Obstetrics & Gynecology

## 2014-01-17 ENCOUNTER — Inpatient Hospital Stay (HOSPITAL_COMMUNITY): Payer: Self-pay

## 2014-01-17 ENCOUNTER — Encounter (HOSPITAL_COMMUNITY): Payer: Self-pay | Admitting: *Deleted

## 2014-01-17 ENCOUNTER — Telehealth: Payer: Self-pay | Admitting: *Deleted

## 2014-01-17 DIAGNOSIS — N854 Malposition of uterus: Secondary | ICD-10-CM | POA: Insufficient documentation

## 2014-01-17 DIAGNOSIS — Z8742 Personal history of other diseases of the female genital tract: Secondary | ICD-10-CM

## 2014-01-17 DIAGNOSIS — F17210 Nicotine dependence, cigarettes, uncomplicated: Secondary | ICD-10-CM | POA: Insufficient documentation

## 2014-01-17 DIAGNOSIS — N949 Unspecified condition associated with female genital organs and menstrual cycle: Secondary | ICD-10-CM

## 2014-01-17 DIAGNOSIS — Z86018 Personal history of other benign neoplasm: Secondary | ICD-10-CM

## 2014-01-17 DIAGNOSIS — N946 Dysmenorrhea, unspecified: Secondary | ICD-10-CM

## 2014-01-17 DIAGNOSIS — R102 Pelvic and perineal pain: Secondary | ICD-10-CM

## 2014-01-17 LAB — URINE MICROSCOPIC-ADD ON

## 2014-01-17 LAB — CBC
HEMATOCRIT: 38.8 % (ref 36.0–46.0)
Hemoglobin: 13.3 g/dL (ref 12.0–15.0)
MCH: 31.1 pg (ref 26.0–34.0)
MCHC: 34.3 g/dL (ref 30.0–36.0)
MCV: 90.9 fL (ref 78.0–100.0)
PLATELETS: 297 10*3/uL (ref 150–400)
RBC: 4.27 MIL/uL (ref 3.87–5.11)
RDW: 12.7 % (ref 11.5–15.5)
WBC: 8.7 10*3/uL (ref 4.0–10.5)

## 2014-01-17 LAB — URINALYSIS, ROUTINE W REFLEX MICROSCOPIC
Bilirubin Urine: NEGATIVE
Glucose, UA: NEGATIVE mg/dL
KETONES UR: NEGATIVE mg/dL
Leukocytes, UA: NEGATIVE
Nitrite: NEGATIVE
Protein, ur: NEGATIVE mg/dL
Specific Gravity, Urine: 1.02 (ref 1.005–1.030)
Urobilinogen, UA: 0.2 mg/dL (ref 0.0–1.0)
pH: 6 (ref 5.0–8.0)

## 2014-01-17 LAB — WET PREP, GENITAL
TRICH WET PREP: NONE SEEN
Yeast Wet Prep HPF POC: NONE SEEN

## 2014-01-17 LAB — POCT PREGNANCY, URINE: Preg Test, Ur: NEGATIVE

## 2014-01-17 MED ORDER — TRAMADOL HCL 50 MG PO TABS: 50.0000 mg | ORAL_TABLET | Freq: Four times a day (QID) | ORAL | Status: DC | PRN

## 2014-01-17 MED ORDER — KETOROLAC TROMETHAMINE 60 MG/2ML IM SOLN
60.0000 mg | INTRAMUSCULAR | Status: AC
  Administered 2014-01-17: 60 mg via INTRAMUSCULAR
  Filled 2014-01-17: qty 2

## 2014-01-17 MED ORDER — IBUPROFEN 600 MG PO TABS: 600.0000 mg | ORAL_TABLET | Freq: Four times a day (QID) | ORAL | Status: DC | PRN

## 2014-01-17 NOTE — Discharge Instructions (Signed)

## 2014-01-17 NOTE — Telephone Encounter (Signed)
Pt called clinic about painful period, extreme bleeding, and cramping.  Contacted patient and discussed symptoms. Pt describes symptoms as severe and in the past needing emergency surgery for a fibroid.  Encouraged patient to go to [facility] for evaluation.  Pt verbalizes understanding.

## 2014-01-17 NOTE — [facility] Note (Signed)
Pt presents to [facility] with complaints of having irregular cycles with back pain. Reports she is taking birth control pills at this time.

## 2014-01-17 NOTE — [facility] Note (Signed)
Pt takes SPRINTEC BCP.

## 2014-01-17 NOTE — MAU Provider Note (Signed)
Chief Complaint: Vaginal Bleeding and Back Pain   First Provider Initiated Contact with Patient 01/17/14 1607     SUBJECTIVE HPI: Donna Gordon is a 34 y.o. G0P0 who presents to maternity admissions reporting painful menses with onset yesterday. She had unusually heavy period last month without much pain but reports severe abdominal cramping since her bleeding started yesterday.  She reports hx of uterine fibroids.  She is taking OCPs daily which usually control her pain and bleeding.  She called the [facility] and was told to come to [facility]U to be evaluated if she was in severe pain.  She denies vaginal itching/burning, urinary symptoms, h/a, dizziness, n/v, or fever/chills.     Past Medical History  Diagnosis Date  . Asthma   . Depression   . History of uterine fibroid   . Fibroid   . Ovarian cyst 2013   Past Surgical History  Procedure Laterality Date  . Myomectomy abdominal approach  08/06/2007  . Tumor removal    . Uterine fibroid surgery     History   Social History  . Marital Status: Single    Spouse Name: N/A    Number of Children: N/A  . Years of Education: N/A   Occupational History  . Not on file.   Social History Main Topics  . Smoking status: Current Every Day Smoker -- 0.50 packs/day    Types: Cigarettes  . Smokeless tobacco: Never Used  . Alcohol Use: Yes     Comment: occasional  . Drug Use: No  . Sexual Activity: Yes    Birth Control/ Protection: Pill     Comment: IUD removed 4 months ago   Other Topics Concern  . Not on file   Social History Narrative   No current facility-administered medications on file prior to encounter.   Current Outpatient Prescriptions on File Prior to Encounter  Medication Sig Dispense Refill  . ibuprofen (ADVIL,MOTRIN) 200 MG tablet Take 600 mg by mouth every 6 (six) hours as needed for moderate pain.     . norgestimate-ethinyl estradiol (ORTHO-CYCLEN,SPRINTEC,PREVIFEM) 0.25-35 MG-MCG tablet Take 1 tablet by mouth every morning.     Marland Kitchen albuterol (PROVENTIL HFA;VENTOLIN HFA) 108 (90 BASE) MCG/ACT inhaler Inhale 2 puffs into the lungs every 6 (six) hours as needed for wheezing or shortness of breath.    . metroNIDAZOLE (FLAGYL) 500 MG tablet Take 1 tablet (500 mg total) by mouth 2 (two) times daily. (Patient not taking: Reported on 01/17/2014) 14 tablet 0   No Known Allergies  ROS: Pertinent items in HPI  OBJECTIVE Blood pressure 147/74, pulse 95, temperature 99 F (37.2 C), resp. rate 18, height  (1.651 m), weight 65.772 kg (145 lb), last menstrual period 01/17/2014. GENERAL: Well-developed, well-nourished female in no acute distress.  HEENT: Normocephalic HEART: normal rate RESP: normal effort ABDOMEN: Soft, non-tender EXTREMITIES: Nontender, no edema NEURO: Alert and oriented Pelvic exam: Cervix pink, visually closed, without lesion, small amount dark red bleeding, vaginal walls and external genitalia normal Bimanual exam: Cervix 0/long/high, firm, anterior, neg CMT, uterus mildly tender, retroverted, nonenlarged, adnexa without tenderness, enlargement, or mass  LAB RESULTS Results for orders placed or performed during the hospital encounter of 01/17/14 (from the past 24 hour(s))  Urinalysis, Routine w reflex microscopic     Status: Abnormal   Collection Time: 01/17/14  2:10 PM  Result Value Ref Range   Color, Urine YELLOW YELLOW   APPearance CLEAR CLEAR   Specific Gravity, Urine 1.020 1.005 - 1.030   pH 6.0  5.0 - 8.0   Glucose, UA NEGATIVE NEGATIVE mg/dL   Hgb urine dipstick MODERATE (A) NEGATIVE   Bilirubin Urine NEGATIVE NEGATIVE   Ketones, ur NEGATIVE NEGATIVE mg/dL   Protein, ur NEGATIVE NEGATIVE mg/dL   Urobilinogen, UA 0.2 0.0 - 1.0 mg/dL   Nitrite NEGATIVE NEGATIVE   Leukocytes, UA NEGATIVE NEGATIVE  Urine microscopic-add on     Status: Abnormal   Collection Time: 01/17/14  2:10 PM  Result Value Ref Range   Squamous Epithelial / LPF FEW (A) RARE   RBC / HPF 0-2 <3 RBC/hpf  Pregnancy, urine  POC     Status: None   Collection Time: 01/17/14  2:23 PM  Result Value Ref Range   Preg Test, Ur NEGATIVE NEGATIVE  Wet prep, genital     Status: Abnormal   Collection Time: 01/17/14  3:50 PM  Result Value Ref Range   Yeast Wet Prep HPF POC NONE SEEN NONE SEEN   Trich, Wet Prep NONE SEEN NONE SEEN   Clue Cells Wet Prep HPF POC FEW (A) NONE SEEN   WBC, Wet Prep HPF POC FEW (A) NONE SEEN  CBC     Status: None   Collection Time: 01/17/14  4:20 PM  Result Value Ref Range   WBC 8.7 4.0 - 10.5 K/uL   RBC 4.27 3.87 - 5.11 MIL/uL   Hemoglobin 13.3 12.0 - 15.0 g/dL   HCT 19.138.8 47.836.0 - 29.546.0 %   MCV 90.9 78.0 - 100.0 fL   MCH 31.1 26.0 - 34.0 pg   MCHC 34.3 30.0 - 36.0 g/dL   RDW 62.112.7 30.811.5 - 65.715.5 %   Platelets 297 150 - 400 K/uL    IMAGING [place] Transvaginal Non-ob  01/17/2014   CLINICAL DATA:  34 year old female with acute onset of pelvic pain. History of fibroids. Irregular menstrual cycle with back pain.  EXAM: TRANSABDOMINAL AND TRANSVAGINAL ULTRASOUND OF PELVIS  TECHNIQUE: Both transabdominal and transvaginal ultrasound examinations of the pelvis were performed. Transabdominal technique was performed for global imaging of the pelvis including uterus, ovaries, adnexal regions, and pelvic cul-de-sac. It was necessary to proceed with endovaginal exam following the transabdominal exam to visualize the endometrium.  COMPARISON:  Pelvic ultrasound 08/26/2011.  FINDINGS: Uterus  Measurements: 7.9 x 4.5 x 6.3 cm. Retroverted uterus. Subtle 9 x 10 x 12 mm slightly hypoechoic lesion in the right side of the uterine body, presumably a small fibroid.  Endometrium  Thickness: 5 mm.  No focal abnormality visualized.  Right ovary  Measurements: 3.2 x 1.7 x 2.0 cm. Normal appearance/no adnexal mass.  Left ovary  Measurements: 2.7 x 1.7 x 2.0 cm. Normal appearance/no adnexal mass.  Other findings  Small volume of free fluid, most notably adjacent to the right adnexa.  IMPRESSION: 1. No acute findings. 2. Small volume  of free fluid adjacent to the right adnexa, likely physiologic in this young female patient. 3. Tiny fibroid in the right side of the uterine body incidentally noted. 4. Retroverted uterus.   Electronically Signed   By: Trudie Reedaniel  Entrikin M.D.   On: 01/17/2014 17:33   [place] Pelvis Complete  01/17/2014   CLINICAL DATA:  34 year old female with acute onset of pelvic pain. History of fibroids. Irregular menstrual cycle with back pain.  EXAM: TRANSABDOMINAL AND TRANSVAGINAL ULTRASOUND OF PELVIS  TECHNIQUE: Both transabdominal and transvaginal ultrasound examinations of the pelvis were performed. Transabdominal technique was performed for global imaging of the pelvis including uterus, ovaries, adnexal regions, and pelvic cul-de-sac. It was necessary  to proceed with endovaginal exam following the transabdominal exam to visualize the endometrium.  COMPARISON:  Pelvic ultrasound 08/26/2011.  FINDINGS: Uterus  Measurements: 7.9 x 4.5 x 6.3 cm. Retroverted uterus. Subtle 9 x 10 x 12 mm slightly hypoechoic lesion in the right side of the uterine body, presumably a small fibroid.  Endometrium  Thickness: 5 mm.  No focal abnormality visualized.  Right ovary  Measurements: 3.2 x 1.7 x 2.0 cm. Normal appearance/no adnexal mass.  Left ovary  Measurements: 2.7 x 1.7 x 2.0 cm. Normal appearance/no adnexal mass.  Other findings  Small volume of free fluid, most notably adjacent to the right adnexa.  IMPRESSION: 1. No acute findings. 2. Small volume of free fluid adjacent to the right adnexa, likely physiologic in this young female patient. 3. Tiny fibroid in the right side of the uterine body incidentally noted. 4. Retroverted uterus.   Electronically Signed   By: Trudie Reed M.D.   On: 01/17/2014 17:33    ASSESSMENT 1. Dysmenorrhea   2. Acute pelvic pain, female   3. History of uterine fibroid     PLAN Toradol 60 mg IM in MAU Discharge home Ibuprofen 600 mg Q 6 hours PRN Ultram 50 mg Q 6 hours PRN x 15 tabs Continue  OCPs       Follow-up Information    Follow up with [facility].   Specialty:  Obstetrics and Gynecology   Why:  As needed   Contact information:   [address] [place] [address] [phone]      Follow up with [facility] [place] MATERN[facility] ADMISSIONS.   Why:  As needed for emergencies   Contact information:   [address] [id number] mc [place] [address] [phone]      Sharen Counter Certified Nurse-Midwife 01/17/2014  5:50 PM

## 2014-01-18 LAB — HIV ANTIBODY (ROUTINE TESTING W REFLEX)
HIV 1/O/2 Abs-Index Value: <1 (ref <1.00)
HIV-1/HIV-2 Ab: NONREACTIVE

## 2014-01-20 LAB — GC/CHLAMYDIA PROBE AMP
CT PROBE, AMP APTIMA: NEGATIVE
GC Probe RNA: NEGATIVE

## 2014-03-24 ENCOUNTER — Other Ambulatory Visit: Payer: Self-pay | Admitting: Family Medicine

## 2014-03-24 ENCOUNTER — Telehealth: Payer: Self-pay | Admitting: *Deleted

## 2014-03-24 MED ORDER — NORGESTIMATE-ETH ESTRADIOL 0.25-35 MG-MCG PO TABS: 1.0000 | ORAL_TABLET | Freq: Every day | ORAL | Status: DC

## 2014-03-27 NOTE — Telephone Encounter (Signed)
Refill on birth control 

## 2014-04-08 ENCOUNTER — Other Ambulatory Visit: Payer: Self-pay | Admitting: Family Medicine

## 2014-04-08 DIAGNOSIS — F32A Depression, unspecified: Secondary | ICD-10-CM

## 2014-04-08 DIAGNOSIS — F329 Major depressive disorder, single episode, unspecified: Secondary | ICD-10-CM

## 2014-04-08 MED ORDER — FLUOXETINE HCL 20 MG PO CAPS: 20.0000 mg | ORAL_CAPSULE | Freq: Every day | ORAL | Status: DC

## 2014-04-23 NOTE — Progress Notes (Signed)
Left message for patient to notify her that her medication has been called in but she would need an appointment before any further refills.

## 2014-06-10 ENCOUNTER — Emergency Department (HOSPITAL_BASED_OUTPATIENT_CLINIC_OR_DEPARTMENT_OTHER)
Admission: EM | Admit: 2014-06-10 | Discharge: 2014-06-10 | Disposition: A | Discharge status: Home / Self Care | Payer: Self-pay | Attending: Emergency Medicine | Admitting: Emergency Medicine

## 2014-06-10 ENCOUNTER — Encounter (HOSPITAL_BASED_OUTPATIENT_CLINIC_OR_DEPARTMENT_OTHER): Payer: Self-pay | Admitting: Emergency Medicine

## 2014-06-10 ENCOUNTER — Emergency Department (HOSPITAL_BASED_OUTPATIENT_CLINIC_OR_DEPARTMENT_OTHER): Payer: Self-pay

## 2014-06-10 DIAGNOSIS — J45909 Unspecified asthma, uncomplicated: Secondary | ICD-10-CM | POA: Insufficient documentation

## 2014-06-10 DIAGNOSIS — Z720 Tobacco use: Secondary | ICD-10-CM | POA: Insufficient documentation

## 2014-06-10 DIAGNOSIS — Y9289 Other specified places as the place of occurrence of the external cause: Secondary | ICD-10-CM | POA: Insufficient documentation

## 2014-06-10 DIAGNOSIS — Z86018 Personal history of other benign neoplasm: Secondary | ICD-10-CM | POA: Insufficient documentation

## 2014-06-10 DIAGNOSIS — W1839XA Other fall on same level, initial encounter: Secondary | ICD-10-CM | POA: Insufficient documentation

## 2014-06-10 DIAGNOSIS — S93402A Sprain of unspecified ligament of left ankle, initial encounter: Secondary | ICD-10-CM | POA: Insufficient documentation

## 2014-06-10 DIAGNOSIS — Y9389 Activity, other specified: Secondary | ICD-10-CM | POA: Insufficient documentation

## 2014-06-10 DIAGNOSIS — Z8742 Personal history of other diseases of the female genital tract: Secondary | ICD-10-CM | POA: Insufficient documentation

## 2014-06-10 DIAGNOSIS — Z79899 Other long term (current) drug therapy: Secondary | ICD-10-CM | POA: Insufficient documentation

## 2014-06-10 DIAGNOSIS — F329 Major depressive disorder, single episode, unspecified: Secondary | ICD-10-CM | POA: Insufficient documentation

## 2014-06-10 DIAGNOSIS — Y998 Other external cause status: Secondary | ICD-10-CM | POA: Insufficient documentation

## 2014-06-10 MED ORDER — IBUPROFEN 600 MG PO TABS: 600.0000 mg | ORAL_TABLET | Freq: Three times a day (TID) | ORAL | Status: DC | PRN

## 2014-06-10 MED ORDER — IBUPROFEN 400 MG PO TABS
600.0000 mg | ORAL_TABLET | Freq: Once | ORAL | Status: AC
  Administered 2014-06-10: 600 mg via ORAL
  Filled 2014-06-10 (×2): qty 1

## 2014-06-10 NOTE — ED Provider Notes (Signed)
CSN: [id number]     Arrival date & time 06/10/14  0865 History   First MD Initiated Contact with Patient 06/10/14 [phone]     Chief Complaint  Patient presents with  . Ankle Injury     (Consider location/radiation/quality/duration/timing/severity/associated sxs/prior Treatment) HPI Patient ports injury to her left ankle 3 days ago.  She stepped off a porch wrong and reports pain in her left lateral ankle.  She's been elevating and applying ice without improvement in her symptoms.  She works in [employer] quality [employer] so she has immobilized her left ankle in a short fracture boot.  She continues to have pain and therefore seeks evaluation a day.  Her pain is mild to moderate in worse with outpatient of her left lateral malleolus.  She denies foot pain.  No other injury.   Past Medical History  Diagnosis Date  . Asthma   . Depression   . History of uterine fibroid   . Fibroid   . Ovarian cyst 2013   Past Surgical History  Procedure Laterality Date  . Myomectomy abdominal approach  08/06/2007  . Tumor removal    . Uterine fibroid surgery     Family History  Problem Relation Age of Onset  . Colon cancer      uncle/aunt/grandfather  . Ulcerative colitis      cousin  . Crohn's disease      cousin  . Liver disease      great-grandfather, alcoholic  . Heart disease      uncle  . Diabetes      uncle  . Other Neg Hx   . Cancer Paternal Aunt 31    pancreatic   . Cancer Maternal Grandmother 37    breast   History  Substance Use Topics  . Smoking status: Current Every Day Smoker -- 0.50 packs/day    Types: Cigarettes  . Smokeless tobacco: Never Used  . Alcohol Use: Yes     Comment: occasional   OB History    Gravida Para Term Preterm AB TAB SAB Ectopic Multiple Living   0              Review of Systems  All other systems reviewed and are negative.     Allergies  Review of patient's allergies indicates no known allergies.  Home Medications   Prior to Admission  medications   Medication Sig Start Date End Date Taking? Authorizing Provider  albuterol (PROVENTIL HFA;VENTOLIN HFA) 108 (90 BASE) MCG/ACT inhaler Inhale 2 puffs into the lungs every 6 (six) hours as needed for wheezing or shortness of breath.   Yes Historical Provider, MD  FLUoxetine (PROZAC) 20 MG capsule Take 1 capsule (20 mg total) by mouth daily. 04/08/14  Yes Reva Bores, MD  ibuprofen (ADVIL,MOTRIN) 600 MG tablet Take 1 tablet (600 mg total) by mouth every 6 (six) hours as needed. 01/17/14  Yes Lisa A Leftwich-Kirby, CNM  norgestimate-ethinyl estradiol (ESTARYLLA) 0.25-35 MG-MCG tablet Take 1 tablet by mouth daily. 03/24/14  Yes Reva Bores, MD  traMADol (ULTRAM) 50 MG tablet Take 1 tablet (50 mg total) by mouth every 6 (six) hours as needed. 01/17/14  Yes Lisa A Leftwich-Kirby, CNM   BP 131/82 mmHg  Pulse 68  Temp(Src) 98.5 F (36.9 C) (Oral)  Resp 18  Ht  (1.651 m)  Wt 140 lb (63.504 kg)  BMI 23.30 kg/m2  SpO2 100%  LMP 05/19/2014 Physical Exam  Constitutional: She is oriented to person, place, and time. She  appears well-developed and well-nourished.  HENT:  Head: Normocephalic.  Eyes: EOM are normal.  Neck: Normal range of motion.  Pulmonary/Chest: Effort normal.  Abdominal: She exhibits no distension.  Musculoskeletal: Normal range of motion.  Tenderness, swelling, mild ecchymosis to left lateral malleolus.  No tenderness of the left midfoot or at the base of the left fifth metatarsal.  Compartments are soft in the left foot.  Normal pulses in left foot.  Neurological: She is alert and oriented to person, place, and time.  Psychiatric: She has a normal mood and affect.  Nursing note and vitals reviewed.   ED Course  Procedures (including critical care time) Labs Review Labs Reviewed - No data to display  Imaging Review Dg Ankle Complete Left  06/10/2014   CLINICAL DATA:  Lateral ankle pain after falling and twisting ankle 3 days ago. Initial encounter.  EXAM:  LEFT ANKLE COMPLETE - 3+ VIEW  COMPARISON:  Left foot radiographs 08/14/2006.  FINDINGS: The mineralization and alignment are normal. There is no evidence of acute fracture or dislocation. The joint spaces are maintained. There is a small bone island within the calcaneal tuberosity which appears unchanged. The soft tissue surrounding the ankle are mildly prominent.  IMPRESSION: No acute osseous findings. Possible mild soft tissue swelling around the ankle.   Electronically Signed   By: Carey BullocksWilliam  Veazey M.D.   On: 06/10/2014 10:53     EKG Interpretation None      MDM   Final diagnoses:  Ankle sprain, left, initial encounter    Suspect sprain.  X-ray pending at this time.  If her x-rays normal patient be discharged home with anti-inflammatories and sports medicine follow-up    Azalia BilisKevin Thyra Yinger, MD 06/10/14 1102

## 2014-06-10 NOTE — ED Notes (Signed)
Patient states she was playing with her puppy and stepped wrong on porch and twisted her left foot.

## 2014-06-10 NOTE — Discharge Instructions (Signed)

## 2014-10-07 HISTORY — PX: CHOLECYSTECTOMY, LAPAROSCOPIC: SHX56

## 2015-04-23 ENCOUNTER — Ambulatory Visit (INDEPENDENT_AMBULATORY_CARE_PROVIDER_SITE_OTHER): Payer: Self-pay | Admitting: General Practice

## 2015-04-23 ENCOUNTER — Encounter: Payer: Self-pay | Admitting: Family Medicine

## 2015-04-23 DIAGNOSIS — Z3201 Encounter for pregnancy test, result positive: Secondary | ICD-10-CM

## 2015-04-23 LAB — POCT PREGNANCY, URINE: Preg Test, Ur: POSITIVE — AB

## 2015-04-23 NOTE — Progress Notes (Signed)
Patient here for upt today. upt positive. Patient informed. Patient reports first positive upt yesterday and lmp 02/14/15. edd 11/21/15 [id number] today. Patient informed and requests to start care here. Encouraged patient to make appt when she leaves today & gave her a new OB packet

## 2015-05-12 ENCOUNTER — Ambulatory Visit (HOSPITAL_COMMUNITY): Payer: Self-pay

## 2015-05-12 ENCOUNTER — Encounter: Payer: Self-pay | Admitting: Family

## 2015-05-12 ENCOUNTER — Ambulatory Visit (INDEPENDENT_AMBULATORY_CARE_PROVIDER_SITE_OTHER): Payer: Self-pay | Admitting: Family

## 2015-05-12 VITALS — BP 121/77 | HR 80 | Wt 164.0 lb

## 2015-05-12 DIAGNOSIS — Z3482 Encounter for supervision of other normal pregnancy, second trimester: Secondary | ICD-10-CM

## 2015-05-12 DIAGNOSIS — Z124 Encounter for screening for malignant neoplasm of cervix: Secondary | ICD-10-CM

## 2015-05-12 DIAGNOSIS — Z3689 Encounter for other specified antenatal screening: Secondary | ICD-10-CM

## 2015-05-12 DIAGNOSIS — Z1151 Encounter for screening for human papillomavirus (HPV): Secondary | ICD-10-CM

## 2015-05-12 DIAGNOSIS — O3429 Maternal care due to uterine scar from other previous surgery: Secondary | ICD-10-CM | POA: Insufficient documentation

## 2015-05-12 DIAGNOSIS — Z3481 Encounter for supervision of other normal pregnancy, first trimester: Secondary | ICD-10-CM

## 2015-05-12 DIAGNOSIS — Z3490 Encounter for supervision of normal pregnancy, unspecified, unspecified trimester: Secondary | ICD-10-CM | POA: Insufficient documentation

## 2015-05-12 DIAGNOSIS — Z113 Encounter for screening for infections with a predominantly sexual mode of transmission: Secondary | ICD-10-CM

## 2015-05-12 LAB — POCT URINALYSIS DIP (DEVICE)
BILIRUBIN URINE: NEGATIVE
GLUCOSE, UA: NEGATIVE mg/dL
KETONES UR: NEGATIVE mg/dL
LEUKOCYTES UA: NEGATIVE
Nitrite: NEGATIVE
Protein, ur: NEGATIVE mg/dL
SPECIFIC GRAVITY, URINE: 1.015 (ref 1.005–1.030)
Urobilinogen, UA: 0.2 mg/dL (ref 0.0–1.0)
pH: 5.5 (ref 5.0–8.0)

## 2015-05-12 NOTE — Progress Notes (Signed)
Patient reports mild cramping in groin/pelvis

## 2015-05-12 NOTE — Patient Instructions (Signed)
First Trimester of Pregnancy The first trimester of pregnancy is from week 1 until the end of week 12 (months 1 through 3). A week after a sperm fertilizes an egg, the egg will implant on the wall of the uterus. This embryo will begin to develop into a baby. Genes from you and your partner are forming the baby. The female genes determine whether the baby is a boy or a girl. At 6-8 weeks, the eyes and face are formed, and the heartbeat can be seen on ultrasound. At the end of 12 weeks, all the baby's organs are formed.  Now that you are pregnant, you will want to do everything you can to have a healthy baby. Two of the most important things are to get good prenatal care and to follow your health care provider's instructions. Prenatal care is all the medical care you receive before the baby's birth. This care will help prevent, find, and treat any problems during the pregnancy and childbirth. BODY CHANGES Your body goes through many changes during pregnancy. The changes vary from woman to woman.   You may gain or lose a couple of pounds at first.  You may feel sick to your stomach (nauseous) and throw up (vomit). If the vomiting is uncontrollable, call your health care provider.  You may tire easily.  You may develop headaches that can be relieved by medicines approved by your health care provider.  You may urinate more often. Painful urination may mean you have a bladder infection.  You may develop heartburn as a result of your pregnancy.  You may develop constipation because certain hormones are causing the muscles that push waste through your intestines to slow down.  You may develop hemorrhoids or swollen, bulging veins (varicose veins).  Your breasts may begin to grow larger and become tender. Your nipples may stick out more, and the tissue that surrounds them (areola) may become darker.  Your gums may bleed and may be sensitive to brushing and flossing.  Dark spots or blotches (chloasma,  mask of pregnancy) may develop on your face. This will likely fade after the baby is born.  Your menstrual periods will stop.  You may have a loss of appetite.  You may develop cravings for certain kinds of food.  You may have changes in your emotions from day to day, such as being excited to be pregnant or being concerned that something may go wrong with the pregnancy and baby.  You may have more vivid and strange dreams.  You may have changes in your hair. These can include thickening of your hair, rapid growth, and changes in texture. Some women also have hair loss during or after pregnancy, or hair that feels dry or thin. Your hair will most likely return to normal after your baby is born. WHAT TO EXPECT AT YOUR PRENATAL VISITS During a routine prenatal visit:  You will be weighed to make sure you and the baby are growing normally.  Your blood pressure will be taken.  Your abdomen will be measured to track your baby's growth.  The fetal heartbeat will be listened to starting around week 10 or 12 of your pregnancy.  Test results from any previous visits will be discussed. Your health care provider may ask you:  How you are feeling.  If you are feeling the baby move.  If you have had any abnormal symptoms, such as leaking fluid, bleeding, severe headaches, or abdominal cramping.  If you are using any tobacco products,   including cigarettes, chewing tobacco, and electronic cigarettes.  If you have any questions. Other tests that may be performed during your first trimester include:  Blood tests to find your blood type and to check for the presence of any previous infections. They will also be used to check for low iron levels (anemia) and Rh antibodies. Later in the pregnancy, blood tests for diabetes will be done along with other tests if problems develop.  Urine tests to check for infections, diabetes, or protein in the urine.  An ultrasound to confirm the proper growth  and development of the baby.  An amniocentesis to check for possible genetic problems.  Fetal screens for spina bifida and Down syndrome.  You may need other tests to make sure you and the baby are doing well.  HIV (human immunodeficiency virus) testing. Routine prenatal testing includes screening for HIV, unless you choose not to have this test. HOME CARE INSTRUCTIONS  Medicines  Follow your health care provider's instructions regarding medicine use. Specific medicines may be either safe or unsafe to take during pregnancy.  Take your prenatal vitamins as directed.  If you develop constipation, try taking a stool softener if your health care provider approves. Diet  Eat regular, well-balanced meals. Choose a variety of foods, such as meat or vegetable-based protein, fish, milk and low-fat dairy products, vegetables, fruits, and whole grain breads and cereals. Your health care provider will help you determine the amount of weight gain that is right for you.  Avoid raw meat and uncooked cheese. These carry germs that can cause birth defects in the baby.  Eating four or five small meals rather than three large meals a day may help relieve nausea and vomiting. If you start to feel nauseous, eating a few soda crackers can be helpful. Drinking liquids between meals instead of during meals also seems to help nausea and vomiting.  If you develop constipation, eat more high-fiber foods, such as fresh vegetables or fruit and whole grains. Drink enough fluids to keep your urine clear or pale yellow. Activity and Exercise  Exercise only as directed by your health care provider. Exercising will help you:  Control your weight.  Stay in shape.  Be prepared for labor and delivery.  Experiencing pain or cramping in the lower abdomen or low back is a good sign that you should stop exercising. Check with your health care provider before continuing normal exercises.  Try to avoid standing for long  periods of time. Move your legs often if you must stand in one place for a long time.  Avoid heavy lifting.  Wear low-heeled shoes, and practice good posture.  You may continue to have sex unless your health care provider directs you otherwise. Relief of Pain or Discomfort  Wear a good support bra for breast tenderness.   Take warm sitz baths to soothe any pain or discomfort caused by hemorrhoids. Use hemorrhoid cream if your health care provider approves.   Rest with your legs elevated if you have leg cramps or low back pain.  If you develop varicose veins in your legs, wear support hose. Elevate your feet for 15 minutes, 3-4 times a day. Limit salt in your diet. Prenatal Care  Schedule your prenatal visits by the twelfth week of pregnancy. They are usually scheduled monthly at first, then more often in the last 2 months before delivery.  Write down your questions. Take them to your prenatal visits.  Keep all your prenatal visits as directed by your   health care provider. Safety  Wear your seat belt at all times when driving.  Make a list of emergency phone numbers, including numbers for family, friends, the hospital, and police and fire departments. General Tips  Ask your health care provider for a referral to a local prenatal education class. Begin classes no later than at the beginning of month 6 of your pregnancy.  Ask for help if you have counseling or nutritional needs during pregnancy. Your health care provider can offer advice or refer you to specialists for help with various needs.  Do not use hot tubs, steam rooms, or saunas.  Do not douche or use tampons or scented sanitary pads.  Do not cross your legs for long periods of time.  Avoid cat litter boxes and soil used by cats. These carry germs that can cause birth defects in the baby and possibly loss of the fetus by miscarriage or stillbirth.  Avoid all smoking, herbs, alcohol, and medicines not prescribed by  your health care provider. Chemicals in these affect the formation and growth of the baby.  Do not use any tobacco products, including cigarettes, chewing tobacco, and electronic cigarettes. If you need help quitting, ask your health care provider. You may receive counseling support and other resources to help you quit.  Schedule a dentist appointment. At home, brush your teeth with a soft toothbrush and be gentle when you floss. SEEK MEDICAL CARE IF:   You have dizziness.  You have mild pelvic cramps, pelvic pressure, or nagging pain in the abdominal area.  You have persistent nausea, vomiting, or diarrhea.  You have a bad smelling vaginal discharge.  You have pain with urination.  You notice increased swelling in your face, hands, legs, or ankles. SEEK IMMEDIATE MEDICAL CARE IF:   You have a fever.  You are leaking fluid from your vagina.  You have spotting or bleeding from your vagina.  You have severe abdominal cramping or pain.  You have rapid weight gain or loss.  You vomit blood or material that looks like coffee grounds.  You are exposed to German measles and have never had them.  You are exposed to fifth disease or chickenpox.  You develop a severe headache.  You have shortness of breath.  You have any kind of trauma, such as from a fall or a car accident.   This information is not intended to replace advice given to you by your health care provider. Make sure you discuss any questions you have with your health care provider.   Document Released: 12/21/2000 Document Revised: 01/17/2014 Document Reviewed: 11/06/2012 [employer]vier Interactive Patient Education 2016 [employer]  

## 2015-05-12 NOTE — Progress Notes (Signed)
Subjective:    Donna Gordon is a G1P0000 [redacted]w[redacted]d being seen today for her first obstetrical visit.  Her obstetrical history is significant for history of fibroids with myomectomy by Dr. Okey Dupre, depression/anxiety recently off prozac.  Reports coping well.  Recently married in March, excited about pregnancy. Patient does intend to breast feed. Pregnancy history fully reviewed.  Patient reports fatigue, nausea and no bleeding.  Filed Vitals:   05/12/15 0758  BP: 121/77  Pulse: 80  Weight: 74.39 kg (164 lb)    HISTORY: OB History  Gravida Para Term Preterm AB SAB TAB Ectopic Multiple Living     # Outcome Date GA Lbr Len/2nd Weight Sex Delivery Anes PTL Lv  1 Current              Past Medical History  Diagnosis Date  . Asthma   . Depression   . History of uterine fibroid   . Fibroid   . Ovarian cyst 2013   Past Surgical History  Procedure Laterality Date  . Myomectomy abdominal approach  08/06/2007  . Tumor removal    . Uterine fibroid surgery    . Cholecystectomy     Family History  Problem Relation Age of Onset  . Colon cancer      uncle/aunt/grandfather  . Ulcerative colitis      cousin  . Crohn's disease      cousin  . Liver disease      great-grandfather, alcoholic  . Heart disease      uncle  . Diabetes      uncle  . Other Neg Hx   . Cancer Paternal Aunt 31    pancreatic   . Cancer Maternal Grandmother 62    breast     Exam   BP 121/77 mmHg  Pulse 80  Wt 74.39 kg (164 lb)  LMP 02/14/2015 (Exact Date)  FHR obtained by bedside ultrasound Uterine Size: size equals dates  Pelvic Exam:    Perineum: No Hemorrhoids, Normal Perineum   Vulva: normal   Vagina:  normal mucosa, normal discharge, no palpable nodules   pH: Not done   Cervix: no bleeding following Pap, no cervical motion tenderness and no lesions   Adnexa: normal adnexa and no mass, fullness, tenderness   Bony Pelvis: Adequate  System: Breast:  No nipple retraction or  dimpling, No nipple discharge or bleeding, No axillary or supraclavicular adenopathy, Normal to palpation without dominant masses   Skin: normal coloration and turgor, no rashes    Neurologic: negative   Extremities: normal strength, tone, and muscle mass   HEENT neck supple with midline trachea and thyroid without masses   Mouth/Teeth mucous membranes moist, pharynx normal without lesions   Neck supple and no masses   Cardiovascular: regular rate and rhythm, no murmurs or gallops   Respiratory:  appears well, vitals normal, no respiratory distress, acyanotic, normal RR, neck free of mass or lymphadenopathy, chest clear, no wheezing, crepitations, rhonchi, normal symmetric air entry   Abdomen: soft, non-tender; bowel sounds normal; no masses,  no organomegaly; increased distention   Urinary: urethral meatus normal      Assessment:    Pregnancy: G1P0000 Patient Active Problem List   Diagnosis Date Noted  . Supervision of normal pregnancy, antepartum 05/12/2015  . Pregnancy with history of uterine myomectomy 05/12/2015  . Contraception management 11/15/2011  . Dysmenorrhea 11/14/2011  . Irritable bowel syndrome 08/05/2011  . Depression 02/21/2011  Plan:     Initial labs drawn.  Pap smear collected. Prenatal vitamins. Review OP report from myomectomy Problem list reviewed and updated. Genetic Screening discussed First Screen: ordered.  Follow up in 4 weeks. Marland Kitchen.   Marlis EdelsonKARIM, Lilliona Blakeney N 05/12/2015

## 2015-05-13 LAB — PRENATAL PROFILE (SOLSTAS)
Antibody Screen: NEGATIVE
BASOS PCT: 0 %
Basophils Absolute: 0 cells/uL (ref 0–200)
EOS ABS: 103 {cells}/uL (ref 15–500)
Eosinophils Relative: 1 %
HCT: 40.5 % (ref 35.0–45.0)
HIV: NONREACTIVE
Hemoglobin: 13.5 g/dL (ref 11.7–15.5)
Hepatitis B Surface Ag: NEGATIVE
LYMPHS PCT: 21 %
Lymphs Abs: 2163 cells/uL (ref 850–3900)
MCH: 30.1 pg (ref 27.0–33.0)
MCHC: 33.3 g/dL (ref 32.0–36.0)
MCV: 90.4 fL (ref 80.0–100.0)
MONO ABS: 618 {cells}/uL (ref 200–950)
MPV: 9.3 fL (ref 7.5–12.5)
Monocytes Relative: 6 %
NEUTROS ABS: 7416 {cells}/uL (ref 1500–7800)
Neutrophils Relative %: 72 %
PLATELETS: 321 10*3/uL (ref 140–400)
RBC: 4.48 MIL/uL (ref 3.80–5.10)
RDW: 13.6 % (ref 11.0–15.0)
RH TYPE: POSITIVE
Rubella: 1.71 Index — ABNORMAL HIGH (ref <0.90)
WBC: 10.3 10*3/uL (ref 3.8–10.8)

## 2015-05-13 LAB — GC/CHLAMYDIA PROBE AMP (~~LOC~~) NOT AT ARMC
Chlamydia: NEGATIVE
Neisseria Gonorrhea: NEGATIVE

## 2015-05-13 LAB — CULTURE, OB URINE

## 2015-05-14 LAB — PRESCRIPTION MONITORING PROFILE (19 PANEL)
AMPHETAMINE/METH: NEGATIVE ng/mL
BARBITURATE SCREEN, URINE: NEGATIVE ng/mL
BENZODIAZEPINE SCREEN, URINE: NEGATIVE ng/mL
BUPRENORPHINE, URINE: NEGATIVE ng/mL
Cannabinoid Scrn, Ur: NEGATIVE ng/mL
Carisoprodol, Urine: NEGATIVE ng/mL
Cocaine Metabolites: NEGATIVE ng/mL
Creatinine, Urine: 62.74 mg/dL (ref >20.0)
ECSTASY: NEGATIVE ng/mL
FENTANYL URINE: NEGATIVE ng/mL
METHAQUALONE SCREEN (URINE): NEGATIVE ng/mL
Meperidine, Ur: NEGATIVE ng/mL
Methadone Screen, Urine: NEGATIVE ng/mL
NITRITES URINE, INITIAL: NEGATIVE ug/mL
OPIATE SCREEN, URINE: NEGATIVE ng/mL
Oxycodone Screen, Ur: NEGATIVE ng/mL
PROPOXYPHENE: NEGATIVE ng/mL
Phencyclidine, Ur: NEGATIVE ng/mL
TRAMADOL UR: NEGATIVE ng/mL
Tapentadol, urine: NEGATIVE ng/mL
ZOLPIDEM, URINE: NEGATIVE ng/mL
pH, Initial: 5.7 pH (ref 4.5–8.9)

## 2015-05-15 LAB — CYTOLOGY - PAP

## 2015-05-18 ENCOUNTER — Telehealth: Payer: Self-pay | Admitting: *Deleted

## 2015-05-18 NOTE — Telephone Encounter (Signed)
Donna Gordon called and left a voicemail this am stating she was here a few weeks ago for first visit and provider told her If she needed to restart antidepressant- to call clinic. States she thinks she needs it-feels like more anxiety- would  Like A call.  Called Ronne and she states she is really irriatable and crying every 5 minutes.  States she did not feel like this  When she was on  Medicine. States was on prozac but stopped when she found out she was pregnant about April 11th. I informed her I would sent a message to provider(Karim) who saw her and we will get back to her once we hear back.

## 2015-05-19 ENCOUNTER — Other Ambulatory Visit: Payer: Self-pay | Admitting: Family

## 2015-05-19 MED ORDER — SERTRALINE HCL 50 MG PO TABS: 50.0000 mg | ORAL_TABLET | Freq: Every day | ORAL | Status: DC

## 2015-05-21 LAB — CYSTIC FIBROSIS DIAGNOSTIC STUDY

## 2015-05-25 NOTE — Telephone Encounter (Signed)
[facility]. I called Donna Gordon and she states she is feeling the same, denies thoughts of suicide.  I informed her rx sent in to her pharmacy- she can start it and call us if symtoms worsen. I reviewed her next ob appt date/time with her. She voices understanding.

## 2015-05-27 ENCOUNTER — Inpatient Hospital Stay (HOSPITAL_COMMUNITY)
Admission: AD | Admit: 2015-05-27 | Discharge: 2015-05-27 | Disposition: A | Discharge status: Home / Self Care | Payer: Self-pay | Source: Ambulatory Visit | Attending: Obstetrics and Gynecology | Admitting: Obstetrics and Gynecology

## 2015-05-27 ENCOUNTER — Encounter (HOSPITAL_COMMUNITY): Payer: Self-pay

## 2015-05-27 DIAGNOSIS — O26891 Other specified pregnancy related conditions, first trimester: Secondary | ICD-10-CM | POA: Insufficient documentation

## 2015-05-27 DIAGNOSIS — J45909 Unspecified asthma, uncomplicated: Secondary | ICD-10-CM | POA: Insufficient documentation

## 2015-05-27 DIAGNOSIS — R55 Syncope and collapse: Secondary | ICD-10-CM

## 2015-05-27 DIAGNOSIS — R0981 Nasal congestion: Secondary | ICD-10-CM | POA: Insufficient documentation

## 2015-05-27 DIAGNOSIS — O3429 Maternal care due to uterine scar from other previous surgery: Secondary | ICD-10-CM

## 2015-05-27 DIAGNOSIS — Z3481 Encounter for supervision of other normal pregnancy, first trimester: Secondary | ICD-10-CM

## 2015-05-27 DIAGNOSIS — Z3A10 10 weeks gestation of pregnancy: Secondary | ICD-10-CM | POA: Insufficient documentation

## 2015-05-27 DIAGNOSIS — O99511 Diseases of the respiratory system complicating pregnancy, first trimester: Secondary | ICD-10-CM | POA: Insufficient documentation

## 2015-05-27 DIAGNOSIS — Z87891 Personal history of nicotine dependence: Secondary | ICD-10-CM | POA: Insufficient documentation

## 2015-05-27 LAB — COMPREHENSIVE METABOLIC PANEL
ALBUMIN: 3.7 g/dL (ref 3.5–5.0)
ALT: 14 U/L (ref 14–54)
ANION GAP: 9 (ref 5–15)
AST: 17 U/L (ref 15–41)
Alkaline Phosphatase: 56 U/L (ref 38–126)
BILIRUBIN TOTAL: 0.5 mg/dL (ref 0.3–1.2)
BUN: 11 mg/dL (ref 6–20)
CO2: 24 mmol/L (ref 22–32)
Calcium: 9.2 mg/dL (ref 8.9–10.3)
Chloride: 105 mmol/L (ref 101–111)
Creatinine, Ser: 0.76 mg/dL (ref 0.44–1.00)
GFR calc Af Amer: >60 mL/min (ref >60)
GFR calc non Af Amer: >60 mL/min (ref >60)
GLUCOSE: 74 mg/dL (ref 65–99)
POTASSIUM: 3.8 mmol/L (ref 3.5–5.1)
Sodium: 138 mmol/L (ref 135–145)
TOTAL PROTEIN: 6.8 g/dL (ref 6.5–8.1)

## 2015-05-27 LAB — URINALYSIS, ROUTINE W REFLEX MICROSCOPIC
Bilirubin Urine: NEGATIVE
Glucose, UA: NEGATIVE mg/dL
Hgb urine dipstick: NEGATIVE
Ketones, ur: NEGATIVE mg/dL
Leukocytes, UA: NEGATIVE
Nitrite: NEGATIVE
Protein, ur: NEGATIVE mg/dL
Specific Gravity, Urine: 1.02 (ref 1.005–1.030)
pH: 6.5 (ref 5.0–8.0)

## 2015-05-27 LAB — CBC
HEMATOCRIT: 36.3 % (ref 36.0–46.0)
Hemoglobin: 12.5 g/dL (ref 12.0–15.0)
MCH: 30.6 pg (ref 26.0–34.0)
MCHC: 34.4 g/dL (ref 30.0–36.0)
MCV: 88.8 fL (ref 78.0–100.0)
Platelets: 253 10*3/uL (ref 150–400)
RBC: 4.09 MIL/uL (ref 3.87–5.11)
RDW: 13.7 % (ref 11.5–15.5)
WBC: 11.9 10*3/uL — ABNORMAL HIGH (ref 4.0–10.5)

## 2015-05-27 MED ORDER — GUAIFENESIN ER 600 MG PO TB12
600.0000 mg | ORAL_TABLET | Freq: Two times a day (BID) | ORAL | Status: DC
  Filled 2015-05-27 (×2): qty 1

## 2015-05-27 MED ORDER — ACETAMINOPHEN 325 MG PO TABS
650.0000 mg | ORAL_TABLET | Freq: Once | ORAL | Status: AC
  Administered 2015-05-27: 650 mg via ORAL
  Filled 2015-05-27: qty 2

## 2015-05-27 MED ORDER — DM-GUAIFENESIN ER 30-600 MG PO TB12
1.0000 | ORAL_TABLET | Freq: Once | ORAL | Status: DC
  Filled 2015-05-27: qty 1

## 2015-05-27 MED ORDER — GUAIFENESIN ER 600 MG PO TB12
600.0000 mg | ORAL_TABLET | Freq: Once | ORAL | Status: AC
  Administered 2015-05-27: 600 mg via ORAL
  Filled 2015-05-27: qty 1

## 2015-05-27 NOTE — MAU Provider Note (Signed)
History     CSN: [id number]  Arrival date and time: 05/27/15 1308   First Provider Initiated Contact with Patient 05/27/15 1536      Chief Complaint  Patient presents with  . Dizziness  . Headache   HPI Pt is 23w0dG1P0000 who started feeling "blah: feeling yesterday- with dizzy and headache- has felt like she was going to pass out.  Pt ate oatmeal for breakfast and orange juice- then pt had a banana and weight watchers meal Pt denies spotting, bleeding, nausea, vomiting UTI sx, constipation or diarrhea, had slight cramping last night. RN note: Pt advised by nurse line to come to MAU, feeling very dizzy, has had HA since 2330 last night, peripheral vision affected earlier today. C/O slight cramping since last night, denies bleeding or discharge. Has been eating & drinking well today.        Past Medical History  Diagnosis Date  . Asthma   . Depression   . History of uterine fibroid   . Fibroid   . Ovarian cyst 2013    Past Surgical History  Procedure Laterality Date  . Myomectomy abdominal approach  08/06/2007  . Tumor removal    . Uterine fibroid surgery    . Cholecystectomy    . Cholecystectomy      Family History  Problem Relation Age of Onset  . Colon cancer      uncle/aunt/grandfather  . Ulcerative colitis      cousin  . Crohn's disease      cousin  . Liver disease      great-grandfather, alcoholic  . Heart disease      uncle  . Diabetes      uncle  . Other Neg Hx   . Cancer Paternal Aunt 31    pancreatic   . Cancer Maternal Grandmother [id number]   breast    Social History  Substance Use Topics  . Smoking status: Former Smoker -- 0.00 packs/day  . Smokeless tobacco: Never Used  . Alcohol Use: No    Allergies: No Known Allergies  Prescriptions prior to admission  Medication Sig Dispense Refill Last Dose  . FLUoxetine (PROZAC) 20 MG capsule Take 1 capsule (20 mg total) by mouth daily. (Patient not taking: Reported on 05/12/2015) 30 capsule 3 Not  Taking  . Prenatal Vit-Fe Fumarate-FA (MULTIVITAMIN-PRENATAL) 27-0.8 MG TABS tablet Take 1 tablet by mouth daily at 12 noon.   Taking  . sertraline (ZOLOFT) 50 MG tablet Take 1 tablet (50 mg total) by mouth daily. 30 tablet 2     Review of Systems  Constitutional: Positive for malaise/fatigue. Negative for fever and chills.  HENT: Positive for congestion.   Respiratory: Positive for cough.        Clear mucous  Gastrointestinal: Negative for nausea, vomiting, abdominal pain and diarrhea.  Genitourinary: Negative for dysuria.  Neurological: Positive for headaches.  Psychiatric/Behavioral: Positive for depression.       Has not started zoloft- off Prozac since april   Physical Exam   Blood pressure 122/64, pulse 94, temperature 97.6 F (36.4 C), temperature source Oral, resp. rate 18, last menstrual period 02/14/2015.  Physical Exam  Vitals reviewed. Constitutional: She is oriented to person, place, and time. She appears well-developed and well-nourished. No distress.  HENT:  Head: Normocephalic.  Eyes: Pupils are equal, round, and reactive to light.  Neck: Normal range of motion.  Cardiovascular: Normal rate and regular rhythm.   Respiratory: Effort normal. No respiratory distress. She has no wheezes.  GI: Soft. She exhibits no distension. There is no tenderness. There is no rebound and no guarding.  FHT 168 bpm audible with doppler  Musculoskeletal: Normal range of motion.  Neurological: She is alert and oriented to person, place, and time.  Skin: Skin is warm and dry.  Psychiatric: She has a normal mood and affect.    MAU Course  Procedures Results for orders placed or performed during the hospital encounter of 05/27/15 (from the past 72 hour(s))  Urinalysis, Routine w reflex microscopic (not at [facility])     Status: Abnormal   Collection Time: 05/27/15  1:45 PM  Result Value Ref Range   Color, Urine YELLOW YELLOW   APPearance HAZY (A) CLEAR   Specific Gravity, Urine 1.020  1.005 - 1.030   pH 6.5 5.0 - 8.0   Glucose, UA NEGATIVE NEGATIVE mg/dL   Hgb urine dipstick NEGATIVE NEGATIVE   Bilirubin Urine NEGATIVE NEGATIVE   Ketones, ur NEGATIVE NEGATIVE mg/dL   Protein, ur NEGATIVE NEGATIVE mg/dL   Nitrite NEGATIVE NEGATIVE   Leukocytes, UA NEGATIVE NEGATIVE    Comment: MICROSCOPIC NOT DONE ON URINES WITH NEGATIVE PROTEIN, BLOOD, LEUKOCYTES, NITRITE, OR GLUCOSE <1000 mg/dL.  CBC     Status: Abnormal   Collection Time: 05/27/15  3:45 PM  Result Value Ref Range   WBC 11.9 (H) 4.0 - 10.5 K/uL   RBC 4.09 3.87 - 5.11 MIL/uL   Hemoglobin 12.5 12.0 - 15.0 g/dL   HCT 36.3 36.0 - 46.0 %   MCV 88.8 78.0 - 100.0 fL   MCH 30.6 26.0 - 34.0 pg   MCHC 34.4 30.0 - 36.0 g/dL   RDW 13.7 11.5 - 15.5 %   Platelets 253 150 - 400 K/uL  Comprehensive metabolic panel     Status: None   Collection Time: 05/27/15  3:45 PM  Result Value Ref Range   Sodium 138 135 - 145 mmol/L   Potassium 3.8 3.5 - 5.1 mmol/L   Chloride 105 101 - 111 mmol/L   CO2 24 22 - 32 mmol/L   Glucose, Bld 74 65 - 99 mg/dL   BUN 11 6 - 20 mg/dL   Creatinine, Ser 0.76 0.44 - 1.00 mg/dL   Calcium 9.2 8.9 - 10.3 mg/dL   Total Protein 6.8 6.5 - 8.1 g/dL   Albumin 3.7 3.5 - 5.0 g/dL   AST 17 15 - 41 U/L   ALT 14 14 - 54 U/L   Alkaline Phosphatase 56 38 - 126 U/L   Total Bilirubin 0.5 0.3 - 1.2 mg/dL   GFR calc non Af Amer >60 >60 mL/min   GFR calc Af Amer >60 >60 mL/min    Comment: (NOTE) The eGFR has been calculated using the CKD EPI equation. This calculation has not been validated in all clinical situations. eGFR's persistently <60 mL/min signify possible Chronic Kidney Disease.    Anion gap 9 5 - 15   Pt felt good after Mucinex and tylenol and ready to go home  Assessment and Plan  Near syncope in pregnancy- first trimester Congestion- mucinex and OTC allegra or claritin or zyrtec Asthma- continue inhaler F/u with OB appointment  [facility] 05/27/2015, 3:37 PM

## 2015-05-27 NOTE — MAU Note (Signed)
Pt advised by nurse line to come to MAU, feeling very dizzy, has had HA since 2330 last night, peripheral vision affected earlier today.  C/O slight cramping since last night, denies bleeding or discharge.  Has been eating & drinking well today.

## 2015-05-27 NOTE — Discharge Instructions (Signed)

## 2015-06-09 ENCOUNTER — Encounter: Payer: Self-pay | Admitting: Family Medicine

## 2015-06-16 ENCOUNTER — Ambulatory Visit (HOSPITAL_COMMUNITY)
Admission: RE | Admit: 2015-06-16 | Discharge: 2015-06-16 | Disposition: A | Discharge status: Home / Self Care | Payer: Self-pay | Source: Ambulatory Visit | Attending: Family | Admitting: Family

## 2015-06-16 ENCOUNTER — Other Ambulatory Visit: Payer: Self-pay | Admitting: General Practice

## 2015-06-16 ENCOUNTER — Encounter (HOSPITAL_COMMUNITY): Payer: Self-pay

## 2015-06-16 VITALS — BP 123/79 | HR 84 | Wt 166.1 lb

## 2015-06-16 DIAGNOSIS — Z3689 Encounter for other specified antenatal screening: Secondary | ICD-10-CM

## 2015-06-16 DIAGNOSIS — Z36 Encounter for antenatal screening of mother: Secondary | ICD-10-CM | POA: Insufficient documentation

## 2015-06-16 DIAGNOSIS — Z3A12 12 weeks gestation of pregnancy: Secondary | ICD-10-CM

## 2015-06-16 DIAGNOSIS — Z3481 Encounter for supervision of other normal pregnancy, first trimester: Secondary | ICD-10-CM

## 2015-06-16 DIAGNOSIS — O3429 Maternal care due to uterine scar from other previous surgery: Secondary | ICD-10-CM

## 2015-06-16 DIAGNOSIS — Z369 Encounter for antenatal screening, unspecified: Secondary | ICD-10-CM

## 2015-06-17 ENCOUNTER — Ambulatory Visit (INDEPENDENT_AMBULATORY_CARE_PROVIDER_SITE_OTHER): Payer: Self-pay | Admitting: Obstetrics and Gynecology

## 2015-06-17 ENCOUNTER — Encounter: Payer: Self-pay | Admitting: Obstetrics and Gynecology

## 2015-06-17 VITALS — BP 112/70 | HR 80 | Wt 165.8 lb

## 2015-06-17 DIAGNOSIS — O3429 Maternal care due to uterine scar from other previous surgery: Secondary | ICD-10-CM

## 2015-06-17 DIAGNOSIS — O219 Vomiting of pregnancy, unspecified: Secondary | ICD-10-CM | POA: Insufficient documentation

## 2015-06-17 DIAGNOSIS — Z3481 Encounter for supervision of other normal pregnancy, first trimester: Secondary | ICD-10-CM

## 2015-06-17 LAB — POCT URINALYSIS DIP (DEVICE)
GLUCOSE, UA: NEGATIVE mg/dL
KETONES UR: NEGATIVE mg/dL
LEUKOCYTES UA: NEGATIVE
Nitrite: NEGATIVE
PROTEIN: 30 mg/dL — AB
Specific Gravity, Urine: >=1.03 (ref 1.005–1.030)
Urobilinogen, UA: 0.2 mg/dL (ref 0.0–1.0)
pH: 5.5 (ref 5.0–8.0)

## 2015-06-17 MED ORDER — PROMETHAZINE HCL 12.5 MG PO TABS: 12.5000 mg | ORAL_TABLET | Freq: Four times a day (QID) | ORAL | Status: DC | PRN

## 2015-06-17 NOTE — Progress Notes (Signed)
Pt reports daily nausea and vomiting - requests Rx.

## 2015-06-17 NOTE — Patient Instructions (Signed)

## 2015-06-17 NOTE — Progress Notes (Signed)
Subjective:  Donna LeachJulianne Gordon is a 35 y.o. G1P0000 at 6420w0d being seen today for ongoing prenatal care.  She is currently monitored for the following issues for this low-risk pregnancy and has Depression; Irritable bowel syndrome; Supervision of normal pregnancy, antepartum; Pregnancy with history of uterine myomectomy; and Nausea and vomiting during pregnancy prior to [redacted] weeks gestation on her problem list.  Patient reports no complaints.  Contractions: Not present. Vag. Bleeding: None.  Movement: Absent. Denies leaking of fluid.   The following portions of the patient's history were reviewed and updated as appropriate: allergies, current medications, past family history, past medical history, past social history, past surgical history and problem list. Problem list updated.  Objective:   Filed Vitals:   06/17/15 0759  BP: 112/70  Pulse: 80  Weight: 165 lb 12.8 oz (75.206 kg)    Fetal Status: Fetal Heart Rate (bpm): 153   Movement: Absent     General:  Alert, oriented and cooperative. Patient is in no acute distress.  Skin: Skin is warm and dry. No rash noted.   Cardiovascular: Normal heart rate noted  Respiratory: Normal respiratory effort, no problems with respiration noted  Abdomen: Soft, gravid, appropriate for gestational age. Pain/Pressure: Present     Pelvic: Vag. Bleeding: None     Cervical exam deferred        Extremities: Normal range of motion.  Edema: None  Mental Status: Normal mood and affect. Normal behavior. Normal judgment and thought content.   Urinalysis:      Assessment and Plan:  Pregnancy: G1P0000 at 2520w0d  1. Nausea and vomiting during pregnancy prior to [redacted] weeks gestation - promethazine (PHENERGAN) 12.5 MG tablet; Take 1 tablet (12.5 mg total) by mouth every 6 (six) hours as needed for nausea or vomiting.  Dispense: 30 tablet; Refill: 0  2. Supervision of normal pregnancy, antepartum, first trimester Doing well  3. Pregnancy with history of uterine  myomectomy Discussed plan to schedule C/S at 39 wks  Preterm labor symptoms and general obstetric precautions including but not limited to vaginal bleeding, contractions, leaking of fluid and fetal movement were reviewed in detail with the patient. Pregnancy precautions reviewed. Had NT US yesterday. Will schedule anatomic screen. Please refer to After Visit Summary for other counseling recommendations.  Return in about 1 month (around 07/17/2015).   Danae Orleanseirdre C Remo Kirschenmann, CNM

## 2015-06-23 ENCOUNTER — Other Ambulatory Visit (HOSPITAL_COMMUNITY): Payer: Self-pay

## 2015-07-06 ENCOUNTER — Telehealth: Payer: Self-pay

## 2015-07-06 NOTE — Telephone Encounter (Signed)
Received called from nurse line patient is having some painful urination and back pain. She would like to know what to do. Please advise

## 2015-07-10 ENCOUNTER — Ambulatory Visit: Payer: Self-pay | Admitting: *Deleted

## 2015-07-10 ENCOUNTER — Telehealth: Payer: Self-pay | Admitting: *Deleted

## 2015-07-10 DIAGNOSIS — R300 Dysuria: Secondary | ICD-10-CM

## 2015-07-10 LAB — POCT URINALYSIS DIP (DEVICE)
BILIRUBIN URINE: NEGATIVE
Glucose, UA: NEGATIVE mg/dL
KETONES UR: NEGATIVE mg/dL
LEUKOCYTES UA: NEGATIVE
NITRITE: NEGATIVE
PH: 5.5 (ref 5.0–8.0)
Protein, ur: NEGATIVE mg/dL
Specific Gravity, Urine: 1.025 (ref 1.005–1.030)
Urobilinogen, UA: 0.2 mg/dL (ref 0.0–1.0)

## 2015-07-10 MED ORDER — NITROFURANTOIN MONOHYD MACRO 100 MG PO CAPS: 100.0000 mg | ORAL_CAPSULE | Freq: Two times a day (BID) | ORAL | Status: DC

## 2015-07-10 NOTE — Telephone Encounter (Signed)
Pt states that she is experiencing painful urination and vaginal itching. Patient to come by and leave urine sample for urinalysis and culture.

## 2015-07-11 LAB — CULTURE, OB URINE
Colony Count: NO GROWTH
Organism ID, Bacteria: NO GROWTH

## 2015-07-15 ENCOUNTER — Encounter: Payer: Self-pay | Admitting: Obstetrics and Gynecology

## 2015-07-29 ENCOUNTER — Ambulatory Visit (HOSPITAL_COMMUNITY)
Admission: RE | Admit: 2015-07-29 | Discharge: 2015-07-29 | Disposition: A | Discharge status: Home / Self Care | Payer: Self-pay | Source: Ambulatory Visit | Attending: Family | Admitting: Family

## 2015-07-29 ENCOUNTER — Other Ambulatory Visit: Payer: Self-pay | Admitting: General Practice

## 2015-07-29 DIAGNOSIS — Z3A19 19 weeks gestation of pregnancy: Secondary | ICD-10-CM | POA: Insufficient documentation

## 2015-07-29 DIAGNOSIS — Z36 Encounter for antenatal screening of mother: Secondary | ICD-10-CM | POA: Insufficient documentation

## 2015-07-29 DIAGNOSIS — Z3482 Encounter for supervision of other normal pregnancy, second trimester: Secondary | ICD-10-CM

## 2015-07-31 ENCOUNTER — Ambulatory Visit (INDEPENDENT_AMBULATORY_CARE_PROVIDER_SITE_OTHER): Payer: Self-pay | Admitting: Obstetrics & Gynecology

## 2015-07-31 VITALS — BP 136/70 | HR 88 | Wt 170.1 lb

## 2015-07-31 DIAGNOSIS — Z3482 Encounter for supervision of other normal pregnancy, second trimester: Secondary | ICD-10-CM

## 2015-07-31 LAB — POCT URINALYSIS DIP (DEVICE)
BILIRUBIN URINE: NEGATIVE
Glucose, UA: NEGATIVE mg/dL
Ketones, ur: NEGATIVE mg/dL
Leukocytes, UA: NEGATIVE
NITRITE: NEGATIVE
PH: 6 (ref 5.0–8.0)
Protein, ur: NEGATIVE mg/dL
Specific Gravity, Urine: 1.025 (ref 1.005–1.030)
UROBILINOGEN UA: 0.2 mg/dL (ref 0.0–1.0)

## 2015-07-31 NOTE — Addendum Note (Signed)
Encounter addended by: Marlis EdelsonWalidah N Karim, CNM on: 07/31/2015 11:46 AM<BR>     Documentation filed: Problem List

## 2015-07-31 NOTE — Patient Instructions (Signed)

## 2015-07-31 NOTE — Progress Notes (Deleted)
.  cwho 

## 2015-07-31 NOTE — Progress Notes (Signed)
Subjective:  Donna LeachJulianne Reeves is a 35 y.o. G1P0000 at 2555w2d being seen today for ongoing prenatal care.  She is currently monitored for the following issues for this low-risk pregnancy and has Depression; Irritable bowel syndrome; Supervision of normal pregnancy, antepartum; Pregnancy with history of uterine myomectomy; and Nausea and vomiting during pregnancy prior to [redacted] weeks gestation on her problem list.  Patient reports no complaints.  Contractions: Not present.  .  Movement: Present. Denies leaking of fluid.   The following portions of the patient's history were reviewed and updated as appropriate: allergies, current medications, past family history, past medical history, past social history, past surgical history and problem list. Problem list updated.  Objective:   Filed Vitals:   07/31/15 0809  BP: 136/70  Pulse: 88  Weight: 170 lb 1.6 oz (77.157 kg)    Fetal Status: Fetal Heart Rate (bpm): 147 Fundal Height: 20 cm Movement: Present     General:  Alert, oriented and cooperative. Patient is in no acute distress.  Skin: Skin is warm and dry. No rash noted.   Cardiovascular: Normal heart rate noted  Respiratory: Normal respiratory effort, no problems with respiration noted  Abdomen: Soft, gravid, appropriate for gestational age. Pain/Pressure: Absent     Pelvic:  Cervical exam deferred        Extremities: Normal range of motion.     Mental Status: Normal mood and affect. Normal behavior. Normal judgment and thought content.   Urinalysis:      Assessment and Plan:  Pregnancy: G1P0000 at 1855w2d  There are no diagnoses linked to this encounter. Preterm labor symptoms and general obstetric precautions including but not limited to vaginal bleeding, contractions, leaking of fluid and fetal movement were reviewed in detail with the patient. Please refer to After Visit Summary for other counseling recommendations.  Return in about 4 weeks (around 08/28/2015). Will need f/u [place] to  complete anatomy in 6 weeks  Adam PhenixJames G Rhealynn Myhre, MD

## 2015-08-05 ENCOUNTER — Encounter: Payer: Self-pay | Admitting: Obstetrics and Gynecology

## 2015-08-05 ENCOUNTER — Telehealth: Payer: Self-pay | Admitting: *Deleted

## 2015-08-05 NOTE — Telephone Encounter (Signed)
Received message left on nurse voicemail by patient on 08/05/15 at 1008.  Patient states she has some questions about her last appointment.  Requests a return call to work [phone] or cell [phone]

## 2015-08-10 NOTE — Telephone Encounter (Signed)
Received call left on nurse voicemail on 08/10/15 at 0825.  Patient states she is [redacted] wks pregnant and is having some back and leg pain.  Requests a return call to [phone].

## 2015-08-10 NOTE — Telephone Encounter (Signed)
I HAVE SPOKEN WITH PATIENT SHE IS HAVING SOME BACK PAIN AND LEG PAIN. I ADVISED PATIENT TO TRY AND CHANGE POSITION AT NIGHT WHILE RESTING. I ALSO SUGGESTED SHE TAKE TYENOL TO SEE IF SHE CAN GET SOME RELIEF. I have fax over a limitations letter for her to give to her employer.

## 2015-08-13 ENCOUNTER — Telehealth: Payer: Self-pay | Admitting: *Deleted

## 2015-08-13 DIAGNOSIS — M5430 Sciatica, unspecified side: Secondary | ICD-10-CM

## 2015-08-13 NOTE — Telephone Encounter (Signed)
Patient called c/o sciatic pain x2 days, tylenol not helping. Spoke with Dr Vergie Living, referred to PT. Patient is agreeable. PT referral placed. Left message to return my call at the clinic to set up referral.

## 2015-08-19 ENCOUNTER — Other Ambulatory Visit: Payer: Self-pay | Admitting: *Deleted

## 2015-08-19 DIAGNOSIS — Z1389 Encounter for screening for other disorder: Secondary | ICD-10-CM

## 2015-08-23 ENCOUNTER — Encounter (HOSPITAL_COMMUNITY): Payer: Self-pay

## 2015-08-23 ENCOUNTER — Inpatient Hospital Stay (HOSPITAL_COMMUNITY)
Admission: AD | Admit: 2015-08-23 | Discharge: 2015-08-23 | Disposition: A | Discharge status: Home / Self Care | Payer: Self-pay | Source: Ambulatory Visit | Attending: Obstetrics & Gynecology | Admitting: Obstetrics & Gynecology

## 2015-08-23 DIAGNOSIS — Z3A22 22 weeks gestation of pregnancy: Secondary | ICD-10-CM | POA: Insufficient documentation

## 2015-08-23 DIAGNOSIS — O4702 False labor before 37 completed weeks of gestation, second trimester: Secondary | ICD-10-CM | POA: Insufficient documentation

## 2015-08-23 LAB — URINALYSIS, ROUTINE W REFLEX MICROSCOPIC
BILIRUBIN URINE: NEGATIVE
Glucose, UA: NEGATIVE mg/dL
Ketones, ur: NEGATIVE mg/dL
Leukocytes, UA: NEGATIVE
NITRITE: NEGATIVE
PH: 5.5 (ref 5.0–8.0)
Protein, ur: NEGATIVE mg/dL
Specific Gravity, Urine: >1.03 — ABNORMAL HIGH (ref 1.005–1.030)

## 2015-08-23 LAB — URINE MICROSCOPIC-ADD ON

## 2015-08-23 NOTE — Discharge Instructions (Signed)
Braxton Hicks Contractions  Contractions of the uterus can occur throughout pregnancy. Contractions are not always a sign that you are in labor.   WHAT ARE BRAXTON HICKS CONTRACTIONS?   Contractions that occur before labor are called Braxton Hicks contractions, or false labor. Toward the end of pregnancy (32-34 weeks), these contractions can develop more often and may become more forceful. This is not true labor because these contractions do not result in opening (dilatation) and thinning of the cervix. They are sometimes difficult to tell apart from true labor because these contractions can be forceful and people have different pain tolerances. You should not feel embarrassed if you go to the hospital with false labor. Sometimes, the only way to tell if you are in true labor is for your health care provider to look for changes in the cervix.  If there are no prenatal problems or other health problems associated with the pregnancy, it is completely safe to be sent home with false labor and await the onset of true labor.  HOW CAN YOU TELL THE DIFFERENCE BETWEEN TRUE AND FALSE LABOR?  False Labor  · The contractions of false labor are usually shorter and not as hard as those of true labor.    · The contractions are usually irregular.    · The contractions are often felt in the front of the lower abdomen and in the groin.    · The contractions may go away when you walk around or change positions while lying down.    · The contractions get weaker and are shorter lasting as time goes on.    · The contractions do not usually become progressively stronger, regular, and closer together as with true labor.    True Labor  · Contractions in true labor last 30-70 seconds, become very regular, usually become more intense, and increase in frequency.    · The contractions do not go away with walking.    · The discomfort is usually felt in the top of the uterus and spreads to the lower abdomen and low back.    · True labor can be  determined by your health care provider with an exam. This will show that the cervix is dilating and getting thinner.    WHAT TO REMEMBER  · Keep up with your usual exercises and follow other instructions given by your health care provider.    · Take medicines as directed by your health care provider.    · Keep your regular prenatal appointments.    · Eat and drink lightly if you think you are going into labor.    · If Braxton Hicks contractions are making you uncomfortable:      Change your position from lying down or resting to walking, or from walking to resting.      Sit and rest in a tub of warm water.      Drink 2-3 glasses of water. Dehydration may cause these contractions.      Do slow and deep breathing several times an hour.    WHEN SHOULD I SEEK IMMEDIATE MEDICAL CARE?  Seek immediate medical care if:  · Your contractions become stronger, more regular, and closer together.    · You have fluid leaking or gushing from your vagina.    · You have a fever.    · You pass blood-tinged mucus.    · You have vaginal bleeding.    · You have continuous abdominal pain.    · You have low back pain that you never had before.    · You feel your baby's head pushing down and causing pelvic pressure.    ·   Your baby is not moving as much as it used to.       This information is not intended to replace advice given to you by your health care provider. Make sure you discuss any questions you have with your health care provider.     Document Released: 12/27/2004 Document Revised: 01/01/2013 Document Reviewed: 10/08/2012  Elsevier Interactive Patient Education ©2016 [employer]  Round Ligament Pain  The round ligament is a cord of muscle and tissue that helps to support the uterus. It can become a source of pain during pregnancy if it becomes stretched or twisted as the baby grows. The pain usually begins in the second trimester of pregnancy, and it can come and go until the baby is delivered. It is not a serious problem, and  it does not cause harm to the baby.  Round ligament pain is usually a short, sharp, and pinching pain, but it can also be a dull, lingering, and aching pain. The pain is felt in the lower side of the abdomen or in the groin. It usually starts deep in the groin and moves up to the outside of the hip area. Pain can occur with:  · A sudden change in position.  · Rolling over in bed.  · Coughing or sneezing.  · Physical activity.  HOME CARE INSTRUCTIONS  Watch your condition for any changes. Take these steps to help with your pain:  · When the pain starts, relax. Then try:    Sitting down.    Flexing your knees up to your abdomen.    Lying on your side with one pillow under your abdomen and another pillow between your legs.    Sitting in a warm bath for 15-20 minutes or until the pain goes away.  · Take over-the-counter and prescription medicines only as told by your health care provider.  · Move slowly when you sit and stand.  · Avoid long walks if they cause pain.  · Stop or lessen your physical activities if they cause pain.  SEEK MEDICAL CARE IF:  · Your pain does not go away with treatment.  · You feel pain in your back that you did not have before.  · Your medicine is not helping.  SEEK IMMEDIATE MEDICAL CARE IF:  · You develop a fever or chills.  · You develop uterine contractions.  · You develop vaginal bleeding.  · You develop nausea or vomiting.  · You develop diarrhea.  · You have pain when you urinate.     This information is not intended to replace advice given to you by your health care provider. Make sure you discuss any questions you have with your health care provider.     Document Released: 10/06/2007 Document Revised: 03/21/2011 Document Reviewed: 03/05/2014  Elsevier Interactive Patient Education ©2016 [employer]

## 2015-08-23 NOTE — MAU Note (Signed)
Called the nurse line, was told to come in, believes she is having contractions.  Has had more than 5 today. NOT more than one an hour. No hx of PTL.  No bleeding or leaking.  Is more concerned about "pre-eclampsia", fingers go numb and tingly and swell at times.  BP has been up at times.

## 2015-08-23 NOTE — MAU Provider Note (Signed)
History   35 yo G1 @ 22.4 wks in with c/o what she thinks might be contractions tha have happened apx 5 times today. (5 contractions) Denies ROM or vag bleeding  CSN: [id number]  Arrival date & time 08/23/15  1449   None     Chief Complaint  Patient presents with  . Abdominal Pain    HPI  Past Medical History:  Diagnosis Date  . Asthma   . Depression   . Fibroid   . History of uterine fibroid   . Ovarian cyst 2013    Past Surgical History:  Procedure Laterality Date  . CHOLECYSTECTOMY    . CHOLECYSTECTOMY    . MYOMECTOMY ABDOMINAL APPROACH  08/06/2007  . TUMOR REMOVAL    . UTERINE FIBROID SURGERY      Family History  Problem Relation Age of Onset  . Colon cancer      uncle/aunt/grandfather  . Ulcerative colitis      cousin  . Crohn's disease      cousin  . Liver disease      great-grandfather, alcoholic  . Heart disease      uncle  . Diabetes      uncle  . Cancer Paternal Aunt 31    pancreatic   . Cancer Maternal Grandmother [id number]    breast  . Other Neg Hx     Social History  Substance Use Topics  . Smoking status: Former Smoker    Packs/day: 0.00  . Smokeless tobacco: Never Used  . Alcohol use No    OB History    Gravida Para Term Preterm AB Living   1 0 0 0 0 0   SAB TAB Ectopic Multiple Live Births   0 0 0 0        Review of Systems  Constitutional: Negative.   HENT: Negative.   Eyes: Negative.   Respiratory: Negative.   Cardiovascular: Negative.   Gastrointestinal: Positive for abdominal pain.  Endocrine: Negative.   Genitourinary: Negative.   Musculoskeletal: Negative.   Skin: Negative.   Allergic/Immunologic: Negative.   Neurological: Negative.   Hematological: Negative.   Psychiatric/Behavioral: Negative.     Allergies  Review of patient's allergies indicates no known allergies.  Home Medications    BP 110/62 (BP Location: Right Arm)   Pulse 110   Temp 98.1 F (36.7 C) (Oral)   Resp 18   LMP 02/14/2015 (Exact Date)    Physical Exam  Constitutional: She is oriented to person, place, and time. She appears well-developed and well-nourished.  HENT:  Head: Normocephalic.  Eyes: Pupils are equal, round, and reactive to light.  Neck: Normal range of motion.  Cardiovascular: Normal rate, regular rhythm, normal heart sounds and intact distal pulses.   Pulmonary/Chest: Effort normal and breath sounds normal.  Abdominal: Soft. Bowel sounds are normal.  Genitourinary: Vagina normal and uterus normal.  Musculoskeletal: Normal range of motion.  Neurological: She is alert and oriented to person, place, and time. She has normal reflexes.  Skin: Skin is warm and dry.  Psychiatric: She has a normal mood and affect. Her behavior is normal. Judgment and thought content normal.    MAU Course  Procedures (including critical care time)  Labs Reviewed  URINALYSIS, ROUTINE W REFLEX MICROSCOPIC (NOT AT [facility]) - Abnormal; Notable for the following:       Result Value   APPearance CLOUDY (*)    Specific Gravity, Urine >1.030 (*)    Hgb urine dipstick SMALL (*)  All other components within normal limits  URINE MICROSCOPIC-ADD ON - Abnormal; Notable for the following:    Squamous Epithelial / LPF 6-30 (*)    Bacteria, UA FEW (*)    All other components within normal limits   No results found.   1. False labor before 37 completed weeks of gestation, antepartum, second trimester       MDM  SVE firm/cl/post/high. No uc's palpated. Discussed round lig pain with pt and discussed PTL S and S. Pt reassured and d/c home

## 2015-08-28 ENCOUNTER — Ambulatory Visit (HOSPITAL_COMMUNITY)
Admission: RE | Admit: 2015-08-28 | Discharge: 2015-08-28 | Disposition: A | Discharge status: Home / Self Care | Payer: Self-pay | Source: Ambulatory Visit | Attending: Obstetrics & Gynecology | Admitting: Obstetrics & Gynecology

## 2015-08-28 ENCOUNTER — Other Ambulatory Visit: Payer: Self-pay | Admitting: Obstetrics & Gynecology

## 2015-08-28 ENCOUNTER — Ambulatory Visit (INDEPENDENT_AMBULATORY_CARE_PROVIDER_SITE_OTHER): Payer: Self-pay | Admitting: Obstetrics & Gynecology

## 2015-08-28 VITALS — BP 133/68 | HR 76 | Wt 172.7 lb

## 2015-08-28 DIAGNOSIS — Z3A23 23 weeks gestation of pregnancy: Secondary | ICD-10-CM

## 2015-08-28 DIAGNOSIS — Z3482 Encounter for supervision of other normal pregnancy, second trimester: Secondary | ICD-10-CM

## 2015-08-28 DIAGNOSIS — Z36 Encounter for antenatal screening of mother: Secondary | ICD-10-CM | POA: Insufficient documentation

## 2015-08-28 DIAGNOSIS — O3429 Maternal care due to uterine scar from other previous surgery: Secondary | ICD-10-CM

## 2015-08-28 DIAGNOSIS — Z1389 Encounter for screening for other disorder: Secondary | ICD-10-CM

## 2015-08-28 LAB — POCT URINALYSIS DIP (DEVICE)
BILIRUBIN URINE: NEGATIVE
Glucose, UA: NEGATIVE mg/dL
Ketones, ur: NEGATIVE mg/dL
LEUKOCYTES UA: NEGATIVE
NITRITE: NEGATIVE
Protein, ur: NEGATIVE mg/dL
SPECIFIC GRAVITY, URINE: 1.015 (ref 1.005–1.030)
UROBILINOGEN UA: 0.2 mg/dL (ref 0.0–1.0)
pH: 6.5 (ref 5.0–8.0)

## 2015-08-28 NOTE — Progress Notes (Signed)
Pt declines pt referral at this time, her sciatica has improved.

## 2015-08-28 NOTE — Progress Notes (Signed)
Subjective:  Donna LeachJulianne Gordon is a 35 y.o. G1P0000 at 7454w2d being seen today for ongoing prenatal care.  She is currently monitored for the following issues for this low-risk pregnancy and has Depression; Irritable bowel syndrome; Supervision of normal pregnancy, antepartum; Pregnancy with history of uterine myomectomy; and Nausea and vomiting during pregnancy prior to [redacted] weeks gestation on her problem list.  Patient reports no complaints.  Contractions: Not present. Vag. Bleeding: None.  Movement: Present. Denies leaking of fluid.   The following portions of the patient's history were reviewed and updated as appropriate: allergies, current medications, past family history, past medical history, past social history, past surgical history and problem list. Problem list updated.  Objective:   Vitals:   08/28/15 0942  BP: 133/68  Pulse: 76  Weight: 172 lb 11.2 oz (78.3 kg)    Fetal Status: Fetal Heart Rate (bpm): 165   Movement: Present     General:  Alert, oriented and cooperative. Patient is in no acute distress.  Skin: Skin is warm and dry. No rash noted.   Cardiovascular: Normal heart rate noted  Respiratory: Normal respiratory effort, no problems with respiration noted  Abdomen: Soft, gravid, appropriate for gestational age. Pain/Pressure: Absent     Pelvic:  Cervical exam deferred        Extremities: Normal range of motion.  Edema: None  Mental Status: Normal mood and affect. Normal behavior. Normal judgment and thought content.   Urinalysis:      Assessment and Plan:  Pregnancy: G1P0000 at 2654w2d  1. Supervision of normal pregnancy, antepartum, second trimester   2. Pregnancy with history of uterine myomectomy - PLTCS to be scheduled  Preterm labor symptoms and general obstetric precautions including but not limited to vaginal bleeding, contractions, leaking of fluid and fetal movement were reviewed in detail with the patient. Please refer to After Visit Summary for other  counseling recommendations.  Return in about 4 weeks (around 09/25/2015) for glucola at next visit.   Donna BossierMyra C Chenelle Benning, MD

## 2015-09-08 ENCOUNTER — Telehealth: Payer: Self-pay | Admitting: *Deleted

## 2015-09-08 NOTE — Telephone Encounter (Addendum)
Pt left message yesterday stating that she is experiencing a lot of pelvic pressure. She going to have C/S @ 36 weeks and wants to know if she needs to come in to be seen.   8/30  1005  Pt called and left another message stating that she is still experiencing pelvic pressure.  I called pt and left message on her personal voice mail stating that she requires evaluation @ MAU today to check for preterm labor. Please go there at your earliest convenience.

## 2015-09-09 ENCOUNTER — Inpatient Hospital Stay (HOSPITAL_COMMUNITY)
Admission: AD | Admit: 2015-09-09 | Discharge: 2015-09-09 | Disposition: A | Discharge status: Home / Self Care | Payer: Self-pay | Source: Ambulatory Visit | Attending: Obstetrics & Gynecology | Admitting: Obstetrics & Gynecology

## 2015-09-09 ENCOUNTER — Encounter (HOSPITAL_COMMUNITY): Payer: Self-pay | Admitting: *Deleted

## 2015-09-09 DIAGNOSIS — Z87891 Personal history of nicotine dependence: Secondary | ICD-10-CM | POA: Insufficient documentation

## 2015-09-09 DIAGNOSIS — M545 Low back pain, unspecified: Secondary | ICD-10-CM

## 2015-09-09 DIAGNOSIS — Z3A25 25 weeks gestation of pregnancy: Secondary | ICD-10-CM | POA: Insufficient documentation

## 2015-09-09 DIAGNOSIS — O26892 Other specified pregnancy related conditions, second trimester: Secondary | ICD-10-CM | POA: Insufficient documentation

## 2015-09-09 DIAGNOSIS — N83209 Unspecified ovarian cyst, unspecified side: Secondary | ICD-10-CM | POA: Insufficient documentation

## 2015-09-09 DIAGNOSIS — J45909 Unspecified asthma, uncomplicated: Secondary | ICD-10-CM | POA: Insufficient documentation

## 2015-09-09 DIAGNOSIS — O99512 Diseases of the respiratory system complicating pregnancy, second trimester: Secondary | ICD-10-CM | POA: Insufficient documentation

## 2015-09-09 DIAGNOSIS — Z79899 Other long term (current) drug therapy: Secondary | ICD-10-CM | POA: Insufficient documentation

## 2015-09-09 DIAGNOSIS — F329 Major depressive disorder, single episode, unspecified: Secondary | ICD-10-CM | POA: Insufficient documentation

## 2015-09-09 DIAGNOSIS — R109 Unspecified abdominal pain: Secondary | ICD-10-CM | POA: Insufficient documentation

## 2015-09-09 DIAGNOSIS — O99342 Other mental disorders complicating pregnancy, second trimester: Secondary | ICD-10-CM | POA: Insufficient documentation

## 2015-09-09 DIAGNOSIS — O3482 Maternal care for other abnormalities of pelvic organs, second trimester: Secondary | ICD-10-CM | POA: Insufficient documentation

## 2015-09-09 DIAGNOSIS — N949 Unspecified condition associated with female genital organs and menstrual cycle: Secondary | ICD-10-CM

## 2015-09-09 LAB — URINALYSIS, ROUTINE W REFLEX MICROSCOPIC
Bilirubin Urine: NEGATIVE
Glucose, UA: NEGATIVE mg/dL
Hgb urine dipstick: NEGATIVE
Ketones, ur: NEGATIVE mg/dL
Leukocytes, UA: NEGATIVE
NITRITE: NEGATIVE
PH: 6.5 (ref 5.0–8.0)
Protein, ur: NEGATIVE mg/dL
SPECIFIC GRAVITY, URINE: 1.02 (ref 1.005–1.030)

## 2015-09-09 LAB — FETAL FIBRONECTIN: FETAL FIBRONECTIN: NEGATIVE

## 2015-09-09 NOTE — Discharge Instructions (Signed)

## 2015-09-09 NOTE — MAU Note (Signed)
Had an episode on Monday. Was really dizzy. Vision blurred. Boss checked her BP 115/51.

## 2015-09-09 NOTE — MAU Provider Note (Signed)
Chief Complaint:  No chief complaint on file.   First Provider Initiated Contact with Patient 09/09/15 1314     HPI: Donna LeachJulianne Gordon is a 35 y.o. G1P0000 at 3525w0dwho presents to maternity admissions reporting pelvic pressure, occasional contractions.  Small amount of low back pain.  Had been constipated in past then took enema.  Now has had about 5 loose stools per day for 2 weeks.  States standing at her job is really hard on her.. She reports good fetal movement, denies LOF, vaginal bleeding, vaginal itching/burning, urinary symptoms, h/a, dizziness, n/v, diarrhea, constipation or fever/chills.  She denies headache, visual changes or RUQ abdominal pain.  Abdominal Pain  This is a new problem. The current episode started in the past 7 days. The onset quality is gradual. The problem occurs intermittently. The problem has been waxing and waning. The pain is located in the LLQ and RLQ. The pain is mild. The quality of the pain is aching and cramping. The abdominal pain radiates to the back. Associated symptoms include diarrhea. Pertinent negatives include no constipation, dysuria, fever, headaches, myalgias, nausea or vomiting.   RN Note: Feeling a lot of constant pressure in pelvis, ongoing since Sat.  Little bit of back pian. No hx of PTL. Has been having diarrhea at times the past wk, stool is really dark.  Had been constipated, took an enema to relieve that Had an episode on Monday. Was really dizzy. Vision blurred. Boss checked her BP 115/51.  Past Medical History: Past Medical History:  Diagnosis Date  . Asthma   . Depression   . Fibroid   . History of uterine fibroid   . Ovarian cyst 2013    Past obstetric history: OB History  Gravida Para Term Preterm AB Living  1 0 0 0 0 0  SAB TAB Ectopic Multiple Live Births  0 0 0 0      # Outcome Date GA Lbr Len/2nd Weight Sex Delivery Anes PTL Lv  1 Current               Past Surgical History: Past Surgical History:  Procedure  Laterality Date  . CHOLECYSTECTOMY    . CHOLECYSTECTOMY    . MYOMECTOMY ABDOMINAL APPROACH  08/06/2007  . TUMOR REMOVAL    . UTERINE FIBROID SURGERY      Family History: Family History  Problem Relation Age of Onset  . Colon cancer      uncle/aunt/grandfather  . Ulcerative colitis      cousin  . Crohn's disease      cousin  . Liver disease      great-grandfather, alcoholic  . Heart disease      uncle  . Diabetes      uncle  . Cancer Paternal Aunt 31    pancreatic   . Cancer Maternal Grandmother 162    breast  . Other Neg Hx     Social History: Social History  Substance Use Topics  . Smoking status: Former Smoker    Packs/day: 0.00  . Smokeless tobacco: Never Used  . Alcohol use No    Allergies: No Known Allergies  Meds:  Prescriptions Prior to Admission  Medication Sig Dispense Refill Last Dose  . calcium carbonate (TUMS - DOSED IN MG ELEMENTAL CALCIUM) 500 MG chewable tablet Chew 2 tablets by mouth as needed for indigestion or heartburn.   Taking  . Prenatal Vit-Fe Fumarate-FA (MULTIVITAMIN-PRENATAL) 27-0.8 MG TABS tablet Take 1 tablet by mouth daily.    Taking  I have reviewed patient's Past Medical Hx, Surgical Hx, Family Hx, Social Hx, medications and allergies.   ROS:  Review of Systems  Constitutional: Negative for fever.  Gastrointestinal: Positive for abdominal pain and diarrhea. Negative for constipation, nausea and vomiting.  Genitourinary: Negative for dysuria.  Musculoskeletal: Negative for myalgias.  Neurological: Negative for headaches.   Other systems negative  Physical Exam  Patient Vitals for the past 24 hrs:  BP Temp Temp src Pulse Resp  09/09/15 1255 128/73 98.1 F (36.7 C) Oral 118 18   Constitutional: Well-developed, well-nourished female in no acute distress.  Cardiovascular: normal rate and rhythm Respiratory: normal effort, clear to auscultation bilaterally GI: Abd soft, non-tender, gravid appropriate for gestational age.    No rebound or guarding. MS: Extremities nontender, no edema, normal ROM Neurologic: Alert and oriented x 4.  GU: Neg CVAT.  Fetal fibronectin collected.   Dilation: Closed Effacement (%): Thick Cervical Position: Posterior Exam by:: Wynelle Bourgeois, CNM  FHT:  Baseline 140 , moderate variability, accelerations present, no decelerations Contractions: Irritability, Irregular  Rare overt contractions, mostly only 10-20 seconds   Labs: Results for orders placed or performed during the hospital encounter of 09/09/15 (from the past 24 hour(s))  Fetal fibronectin     Status: None   Collection Time: 09/09/15  1:17 PM  Result Value Ref Range   Fetal Fibronectin NEGATIVE NEGATIVE  Urinalysis, Routine w reflex microscopic (not at [facility])     Status: Abnormal   Collection Time: 09/09/15  1:53 PM  Result Value Ref Range   Color, Urine YELLOW YELLOW   APPearance HAZY (A) CLEAR   Specific Gravity, Urine 1.020 1.005 - 1.030   pH 6.5 5.0 - 8.0   Glucose, UA NEGATIVE NEGATIVE mg/dL   Hgb urine dipstick NEGATIVE NEGATIVE   Bilirubin Urine NEGATIVE NEGATIVE   Ketones, ur NEGATIVE NEGATIVE mg/dL   Protein, ur NEGATIVE NEGATIVE mg/dL   Nitrite NEGATIVE NEGATIVE   Leukocytes, UA NEGATIVE NEGATIVE    Imaging:  none  MAU Course/MDM: I have ordered labs and reviewed results.  NST reviewed Fetal fibronectin done for reassurance. Doubt PTL, but wanted to make sure we weren't having masked contractions Cervix long and closed, and FFn negative, which reassures against PTL  Assessment: SIUP at [redacted]w[redacted]d Low back pain Pelvic pressure No evidence of Preterm labor  Plan: Discharge home Preterm Labor precautions and fetal kick counts Follow up in Office for prenatal visits and recheck of cervix   Pt stable at time of discharge.  Encouraged to return here or to other Urgent Care/ED if she develops worsening of symptoms, increase in pain, fever, or other concerning symptoms.      Wynelle Bourgeois  CNM, MSN Certified Nurse-Midwife 09/09/2015 1:14 PM

## 2015-09-09 NOTE — MAU Note (Signed)
Feeling a lot of constant pressure in pelvis, ongoing since Sat.  Little bit of back pian. No hx of PTL. Has been having diarrhea at times the past wk, stool is really dark.  Had been constipated, took an enema to relieve that.

## 2015-09-24 ENCOUNTER — Ambulatory Visit (INDEPENDENT_AMBULATORY_CARE_PROVIDER_SITE_OTHER): Payer: Self-pay | Admitting: Family Medicine

## 2015-09-24 VITALS — BP 132/69 | HR 84 | Wt 176.0 lb

## 2015-09-24 DIAGNOSIS — Z23 Encounter for immunization: Secondary | ICD-10-CM

## 2015-09-24 DIAGNOSIS — Z3482 Encounter for supervision of other normal pregnancy, second trimester: Secondary | ICD-10-CM

## 2015-09-24 DIAGNOSIS — O3429 Maternal care due to uterine scar from other previous surgery: Secondary | ICD-10-CM

## 2015-09-24 DIAGNOSIS — Z3492 Encounter for supervision of normal pregnancy, unspecified, second trimester: Secondary | ICD-10-CM

## 2015-09-24 LAB — CBC
HCT: 32.4 % — ABNORMAL LOW (ref 35.0–45.0)
HEMOGLOBIN: 11 g/dL — AB (ref 11.7–15.5)
MCH: 31.2 pg (ref 27.0–33.0)
MCHC: 34 g/dL (ref 32.0–36.0)
MCV: 91.8 fL (ref 80.0–100.0)
MPV: 9.6 fL (ref 7.5–12.5)
Platelets: 306 10*3/uL (ref 140–400)
RBC: 3.53 MIL/uL — ABNORMAL LOW (ref 3.80–5.10)
RDW: 13.4 % (ref 11.0–15.0)
WBC: 12.5 10*3/uL — AB (ref 3.8–10.8)

## 2015-09-24 LAB — POCT URINALYSIS DIP (DEVICE)
GLUCOSE, UA: NEGATIVE mg/dL
Ketones, ur: NEGATIVE mg/dL
LEUKOCYTES UA: NEGATIVE
NITRITE: NEGATIVE
Protein, ur: 30 mg/dL — AB
Specific Gravity, Urine: >=1.03 (ref 1.005–1.030)
UROBILINOGEN UA: 1 mg/dL (ref 0.0–1.0)
pH: 5.5 (ref 5.0–8.0)

## 2015-09-24 NOTE — Progress Notes (Signed)
   PRENATAL VISIT NOTE  Subjective:  Donna LeachJulianne Gordon is a 35 y.o. G1P0000 at 8647w1d being seen today for ongoing prenatal care.  She is currently monitored for the following issues for this low-risk pregnancy and has Depression; Irritable bowel syndrome; Supervision of normal pregnancy, antepartum; Pregnancy with history of uterine myomectomy; and Nausea and vomiting during pregnancy prior to [redacted] weeks gestation on her problem list.  Patient reports heartburn and difficulty sleeping.  Contractions: Not present. Vag. Bleeding: None.  Movement: Present. Denies leaking of fluid.   The following portions of the patient's history were reviewed and updated as appropriate: allergies, current medications, past family history, past medical history, past social history, past surgical history and problem list. Problem list updated.  Objective:   Vitals:   09/24/15 1435  BP: 132/69  Pulse: 84  Weight: 176 lb (79.8 kg)    Fetal Status: Fetal Heart Rate (bpm): 159   Movement: Present     General:  Alert, oriented and cooperative. Patient is in no acute distress.  Skin: Skin is warm and dry. No rash noted.   Cardiovascular: Normal heart rate noted  Respiratory: Normal respiratory effort, no problems with respiration noted  Abdomen: Soft, gravid, appropriate for gestational age. Pain/Pressure: Absent     Pelvic:  Cervical exam deferred        Extremities: Normal range of motion.  Edema: None  Mental Status: Normal mood and affect. Normal behavior. Normal judgment and thought content.   Urinalysis:      Assessment and Plan:  Pregnancy: G1P0000 at 6247w1d  1. Supervision of normal pregnancy in second trimester Zantac, Pepcid recommended for heartburn.  Unisom for sleep.  28 week labs.  FHT and Fh normal - Glucose Tolerance, 1 HR (50g) w/o Fasting - CBC - RPR - HIV antibody (with reflex)  2.  Myomectomy Primary c/s at 36-37 weeks.  Will schedule  Preterm labor symptoms and general obstetric  precautions including but not limited to vaginal bleeding, contractions, leaking of fluid and fetal movement were reviewed in detail with the patient. Please refer to After Visit Summary for other counseling recommendations.  Return in about 4 weeks (around 10/22/2015).  Donna HeritageJacob J Gordon Mccullers, DO

## 2015-09-24 NOTE — Patient Instructions (Addendum)
Zantac (ranitidine) 150mg  at bedtime (can take up to twice a day) Pepcid (famotidine) 40mg  at bedtime (Can take up to twice a day) Unisom 25mg  at bedtime for sleep

## 2015-09-24 NOTE — Progress Notes (Signed)
28 week labs today. Pt would like tdap, declines flu.

## 2015-09-25 LAB — GLUCOSE TOLERANCE, 1 HOUR (50G) W/O FASTING: GLUCOSE, 1 HR, GESTATIONAL: 113 mg/dL (ref <140)

## 2015-09-25 LAB — HIV ANTIBODY (ROUTINE TESTING W REFLEX): HIV 1&2 Ab, 4th Generation: NONREACTIVE

## 2015-09-25 LAB — RPR

## 2015-09-29 ENCOUNTER — Telehealth: Payer: Self-pay | Admitting: *Deleted

## 2015-09-29 NOTE — Telephone Encounter (Addendum)
Pt left message yesterday @ [phone] requesting information about the date and time of her C/S. Per Dr. Denyce RobertStinson's note, pt is to have C/S scheduled @ 36-[redacted] wks EGA. I returned pt 's call today and informed her that the person who schedules surgery is not working today. Pt was informed that I will contact her within the next few days and once the surgery is posted, I will call pt back. Pt voiced understanding.   9/20  1005  Spoke w/[place] in Ob/Gyn office. She will post the surgery and contact the pt with all information.

## 2015-09-30 ENCOUNTER — Encounter (HOSPITAL_COMMUNITY): Payer: Self-pay | Admitting: *Deleted

## 2015-10-22 ENCOUNTER — Ambulatory Visit (INDEPENDENT_AMBULATORY_CARE_PROVIDER_SITE_OTHER): Payer: Self-pay | Admitting: Obstetrics and Gynecology

## 2015-10-22 VITALS — BP 126/65 | HR 95 | Wt 178.9 lb

## 2015-10-22 DIAGNOSIS — Z3403 Encounter for supervision of normal first pregnancy, third trimester: Secondary | ICD-10-CM

## 2015-10-22 DIAGNOSIS — Z3400 Encounter for supervision of normal first pregnancy, unspecified trimester: Secondary | ICD-10-CM

## 2015-10-22 DIAGNOSIS — O3429 Maternal care due to uterine scar from other previous surgery: Secondary | ICD-10-CM

## 2015-10-22 NOTE — Progress Notes (Signed)
Subjective:  Donna LeachJulianne Gordon is a 35 y.o. G1P0000 at 6240w1d being seen today for ongoing prenatal care.  She is currently monitored for the following issues for this high-risk pregnancy and has Depression; Irritable bowel syndrome; Supervision of normal pregnancy, antepartum; Pregnancy with history of uterine myomectomy; and Nausea and vomiting during pregnancy prior to [redacted] weeks gestation on her problem list.  Patient reports no complaints.  Contractions: Not present. Vag. Bleeding: None.  Movement: Present. Denies leaking of fluid.   The following portions of the patient's history were reviewed and updated as appropriate: allergies, current medications, past family history, past medical history, past social history, past surgical history and problem list. Problem list updated.  Objective:   Vitals:   10/22/15 1029  BP: 126/65  Pulse: 95  Weight: 178 lb 14.4 oz (81.1 kg)    Fetal Status: Fetal Heart Rate (bpm): 157   Movement: Present     General:  Alert, oriented and cooperative. Patient is in no acute distress.  Skin: Skin is warm and dry. No rash noted.   Cardiovascular: Normal heart rate noted  Respiratory: Normal respiratory effort, no problems with respiration noted  Abdomen: Soft, gravid, appropriate for gestational age. Pain/Pressure: Present     Pelvic:  Cervical exam performed        Extremities: Normal range of motion.  Edema: None  Mental Status: Normal mood and affect. Normal behavior. Normal judgment and thought content.   Urinalysis:      Assessment and Plan:  Pregnancy: G1P0000 at 2140w1d  1. Supervision of normal first pregnancy, antepartum stable  2. Pregnancy with history of uterine myomectomy C section schedule at 36 weeks  Preterm labor symptoms and general obstetric precautions including but not limited to vaginal bleeding, contractions, leaking of fluid and fetal movement were reviewed in detail with the patient. Please refer to After Visit Summary for other  counseling recommendations.  No Follow-up on file.   Hermina StaggersMichael L Bentlee Drier, MD

## 2015-10-22 NOTE — Progress Notes (Signed)
Declined flu vaccine

## 2015-10-29 ENCOUNTER — Telehealth: Payer: Self-pay | Admitting: General Practice

## 2015-10-29 NOTE — Telephone Encounter (Signed)
Patient is calling requesting a suggestion on what to do about her cough. Patient states she has been on mucinex for 2 weeks with hardly any relief. Patient states the cough causes chest pain and is non productive. Called patient, no answer- left message stating I am trying to reach you to return your phone call and I did receive your phone message. You may want to stop taking the mucinex and try taking robitussin plain or DM as well as cough drops. You may call us back if that is not helpful or if you have questions

## 2015-11-03 ENCOUNTER — Ambulatory Visit (INDEPENDENT_AMBULATORY_CARE_PROVIDER_SITE_OTHER): Payer: Self-pay | Admitting: Obstetrics and Gynecology

## 2015-11-03 ENCOUNTER — Telehealth: Payer: Self-pay | Admitting: *Deleted

## 2015-11-03 ENCOUNTER — Encounter: Payer: Self-pay | Admitting: *Deleted

## 2015-11-03 ENCOUNTER — Ambulatory Visit: Payer: Self-pay | Admitting: Obstetrics and Gynecology

## 2015-11-03 VITALS — BP 131/58 | HR 96

## 2015-11-03 VITALS — BP 131/58

## 2015-11-03 DIAGNOSIS — R399 Unspecified symptoms and signs involving the genitourinary system: Secondary | ICD-10-CM

## 2015-11-03 DIAGNOSIS — Z3403 Encounter for supervision of normal first pregnancy, third trimester: Secondary | ICD-10-CM

## 2015-11-03 DIAGNOSIS — O26893 Other specified pregnancy related conditions, third trimester: Secondary | ICD-10-CM

## 2015-11-03 DIAGNOSIS — R300 Dysuria: Secondary | ICD-10-CM

## 2015-11-03 DIAGNOSIS — O26899 Other specified pregnancy related conditions, unspecified trimester: Secondary | ICD-10-CM | POA: Insufficient documentation

## 2015-11-03 DIAGNOSIS — Z3400 Encounter for supervision of normal first pregnancy, unspecified trimester: Secondary | ICD-10-CM

## 2015-11-03 LAB — POCT URINALYSIS DIP (DEVICE)
BILIRUBIN URINE: NEGATIVE
Glucose, UA: NEGATIVE mg/dL
HGB URINE DIPSTICK: NEGATIVE
KETONES UR: NEGATIVE mg/dL
LEUKOCYTES UA: NEGATIVE
Nitrite: NEGATIVE
PH: 6.5 (ref 5.0–8.0)
Protein, ur: NEGATIVE mg/dL
Specific Gravity, Urine: 1.015 (ref 1.005–1.030)
Urobilinogen, UA: 0.2 mg/dL (ref 0.0–1.0)

## 2015-11-03 NOTE — Telephone Encounter (Signed)
Pt called complaining of increasing braxton hicks contractions, severe back pain. She also has urinary frequency and pressure. Pt to come in and give a urine sample and talk with nurse. Pt to come now.

## 2015-11-03 NOTE — Progress Notes (Signed)
Patient to clinic, urine negative, will culture. Patient having lots of pressure, contractions with back pain. Says "I just don't feel right". To be seen by Dr Alysia PennaErvin.  See OB chart

## 2015-11-03 NOTE — Progress Notes (Signed)
Subjective:  Donna Gordon is a 35 y.o. G1P0000 at 1039w6d being seen today for work in due to dysuria and general discomforts of pregnancy. She is currently monitored for the following issues for this low-risk pregnancy and has Depression; Irritable bowel syndrome; Supervision of normal pregnancy, antepartum; Pregnancy with history of uterine myomectomy; Nausea and vomiting during pregnancy prior to [redacted] weeks gestation; and Dysuria during pregnancy on her problem list.  Patient reports occasional contractions.   .  .   . Denies leaking of fluid.   The following portions of the patient's history were reviewed and updated as appropriate: allergies, current medications, past family history, past medical history, past social history, past surgical history and problem list. Problem list updated.  Objective:  There were no vitals filed for this visit.  Fetal Status:           General:  Alert, oriented and cooperative. Patient is in no acute distress.  Skin: Skin is warm and dry. No rash noted.   Cardiovascular: Normal heart rate noted  Respiratory: Normal respiratory effort, no problems with respiration noted  Abdomen: Soft, gravid, appropriate for gestational age.       Pelvic:  Cervical exam performed        Extremities: Normal range of motion.     Mental Status: Normal mood and affect. Normal behavior. Normal judgment and thought content.   Urinalysis:      Assessment and Plan:  Pregnancy: G1P0000 at 10039w6d  1. Supervision of normal first pregnancy, antepartum General discomforts of pregnancy reviewed with pt Tylenol, warm moist heat and increase water intake recommended to pt Keep routine OB appt  2. Dysuria during pregnancy in third trimester  - Culture, OB Urine  Preterm labor symptoms and general obstetric precautions including but not limited to vaginal bleeding, contractions, leaking of fluid and fetal movement were reviewed in detail with the patient. Please refer to After  Visit Summary for other counseling recommendations.  No Follow-up on file.   Donna StaggersMichael L Gordon Betty, MD

## 2015-11-05 LAB — CULTURE, OB URINE

## 2015-11-09 ENCOUNTER — Encounter: Payer: Self-pay | Admitting: Obstetrics and Gynecology

## 2015-11-09 ENCOUNTER — Ambulatory Visit (INDEPENDENT_AMBULATORY_CARE_PROVIDER_SITE_OTHER): Payer: Self-pay | Admitting: Obstetrics and Gynecology

## 2015-11-09 VITALS — BP 132/73 | HR 95 | Wt 177.9 lb

## 2015-11-09 DIAGNOSIS — F32A Depression, unspecified: Secondary | ICD-10-CM

## 2015-11-09 DIAGNOSIS — O3429 Maternal care due to uterine scar from other previous surgery: Secondary | ICD-10-CM

## 2015-11-09 DIAGNOSIS — F329 Major depressive disorder, single episode, unspecified: Secondary | ICD-10-CM

## 2015-11-09 DIAGNOSIS — Z3400 Encounter for supervision of normal first pregnancy, unspecified trimester: Secondary | ICD-10-CM

## 2015-11-09 DIAGNOSIS — Z3403 Encounter for supervision of normal first pregnancy, third trimester: Secondary | ICD-10-CM

## 2015-11-09 DIAGNOSIS — F419 Anxiety disorder, unspecified: Secondary | ICD-10-CM

## 2015-11-09 DIAGNOSIS — F418 Other specified anxiety disorders: Secondary | ICD-10-CM

## 2015-11-09 NOTE — Progress Notes (Signed)
Prenatal Visit Note Date: 11/09/2015 Clinic: [facility]  Subjective:  Donna Gordon is a 35 y.o. G1P0000 at 6271w5d being seen today for ongoing prenatal care.  She is currently monitored for the following issues for this low-risk pregnancy and has Anxiety and depression; Irritable bowel syndrome; Supervision of normal pregnancy, antepartum; and Pregnancy with history of uterine myomectomy on her problem list.  Patient reports no complaints.   Contractions: Irregular.  .  Movement: Present. Denies leaking of fluid.   The following portions of the patient's history were reviewed and updated as appropriate: allergies, current medications, past family history, past medical history, past social history, past surgical history and problem list. Problem list updated.  Objective:   Vitals:   11/09/15 0952  BP: 132/73  Pulse: 95  Weight: 177 lb 14.4 oz (80.7 kg)    Fetal Status: Fetal Heart Rate (bpm): 156 Fundal Height: 35 cm Movement: Present  Presentation: Vertex  General:  Alert, oriented and cooperative. Patient is in no acute distress.  Skin: Skin is warm and dry. No rash noted.   Cardiovascular: Normal heart rate noted  Respiratory: Normal respiratory effort, no problems with respiration noted  Abdomen: Soft, gravid, appropriate for gestational age. Pain/Pressure: Present     Pelvic:  Cervical exam deferred        Extremities: Normal range of motion.     Mental Status: Normal mood and affect. Normal behavior. Normal judgment and thought content.   Urinalysis:      Assessment and Plan:  Pregnancy: G1P0000 at 8171w5d  1. Supervision of normal first pregnancy, antepartum Routine care. IUD  2. Pregnancy with history of uterine myomectomy Already set up by dr Alysia Pennaervin for 36wks  3. Anxiety and depression H/o prozac in the past (not during pregnancy). Currently no issues.   Preterm labor symptoms and general obstetric precautions including but not limited to  vaginal bleeding, contractions, leaking of fluid and fetal movement were reviewed in detail with the patient. Please refer to After Visit Summary for other counseling recommendations.  RTC 10-14d  [place] Bingharlie Amberlee Garvey, MD

## 2015-11-10 ENCOUNTER — Telehealth: Payer: Self-pay | Admitting: General Practice

## 2015-11-10 ENCOUNTER — Encounter (HOSPITAL_COMMUNITY): Payer: Self-pay

## 2015-11-10 NOTE — Telephone Encounter (Signed)
Patient called into front office stating she has been having painful contractions since last night. Patient states they start in the front and wrap to the back. Patient reports a pain of 5 with them. Patient states she has been getting about 12 an hour. Told patient to go to MAU for evaluation. Patient verbalized understanding & had no questions

## 2015-11-20 ENCOUNTER — Ambulatory Visit (INDEPENDENT_AMBULATORY_CARE_PROVIDER_SITE_OTHER): Payer: Self-pay | Admitting: Obstetrics and Gynecology

## 2015-11-20 VITALS — BP 126/67 | HR 91 | Wt 178.1 lb

## 2015-11-20 DIAGNOSIS — F32A Depression, unspecified: Secondary | ICD-10-CM

## 2015-11-20 DIAGNOSIS — F419 Anxiety disorder, unspecified: Secondary | ICD-10-CM

## 2015-11-20 DIAGNOSIS — Z3403 Encounter for supervision of normal first pregnancy, third trimester: Secondary | ICD-10-CM

## 2015-11-20 DIAGNOSIS — O3429 Maternal care due to uterine scar from other previous surgery: Secondary | ICD-10-CM

## 2015-11-20 DIAGNOSIS — Z3400 Encounter for supervision of normal first pregnancy, unspecified trimester: Secondary | ICD-10-CM

## 2015-11-20 DIAGNOSIS — F418 Other specified anxiety disorders: Secondary | ICD-10-CM

## 2015-11-20 DIAGNOSIS — F329 Major depressive disorder, single episode, unspecified: Secondary | ICD-10-CM

## 2015-11-20 MED ORDER — SERTRALINE HCL 50 MG PO TABS: ORAL_TABLET | ORAL | 5 refills | Status: DC

## 2015-11-20 NOTE — Progress Notes (Signed)
Subjective:  Donna Gordon is a 35 y.o. G1P0000 at [redacted]w[redacted]d being seen today for ongoing prenatal care.  She is currently monitored for the following issues for this high-risk pregnancy and has Anxiety and depression; Irritable bowel syndrome; Supervision of normal pregnancy, antepartum; and Pregnancy with history of uterine myomectomy on her problem list.  Patient reports no complaints.  Contractions: Not present.  .  Movement: Present. Denies leaking of fluid.   The following portions of the patient's history were reviewed and updated as appropriate: allergies, current medications, past family history, past medical history, past social history, past surgical history and problem list. Problem list updated.  Objective:   Vitals:   11/20/15 0916  BP: 126/67  Pulse: 91  Weight: 178 lb 1.6 oz (80.8 kg)    Fetal Status: Fetal Heart Rate (bpm): 147 Fundal Height: 36 cm Movement: Present     General:  Alert, oriented and cooperative. Patient is in no acute distress.  Skin: Skin is warm and dry. No rash noted.   Cardiovascular: Normal heart rate noted  Respiratory: Normal respiratory effort, no problems with respiration noted  Abdomen: Soft, gravid, appropriate for gestational age. Pain/Pressure: Present     Pelvic:  Cervical exam deferred        Extremities: Normal range of motion.     Mental Status: Normal mood and affect. Normal behavior. Normal judgment and thought content.   Urinalysis:      Assessment and Plan:  Pregnancy: G1P0000 at 833w2d  1. Supervision of normal first pregnancy, antepartum H/O some GAD/Depression. Previously took Prozac. Would like to start something now  Pt very anxious about c section next week Will start Zoloft. U/R/B reviewed with pt  2. Pregnancy with history of uterine myomectomy For c section next Wednesday C section reviewed with pt  Preterm labor symptoms and general obstetric precautions including but not limited to vaginal bleeding,  contractions, leaking of fluid and fetal movement were reviewed in detail with the patient. Please refer to After Visit Summary for other counseling recommendations.  No Follow-up on file.   Hermina StaggersMichael L Arhan Mcmanamon, MD

## 2015-11-20 NOTE — Patient Instructions (Signed)
Cesarean Delivery, Care After  Refer to this sheet in the next few weeks. These instructions provide you with information on caring for yourself after your procedure. Your health care provider may also give you specific instructions. Your treatment has been planned according to current medical practices, but problems sometimes occur. Call your health care provider if you have any problems or questions after you go home.  HOME CARE INSTRUCTIONS   Only take over-the-counter or prescription medications as directed by your health care provider.   Do not drink alcohol, especially if you are breastfeeding or taking medication to relieve pain.   Do not chew or smoke tobacco.   Continue to use good perineal care. Good perineal care includes:    Wiping your perineum from front to back.    Keeping your perineum clean.   Check your surgical cut (incision) daily for increased redness, drainage, swelling, or separation of skin.   Clean your incision gently with soap and water every day, and then pat it dry. If your health care provider says it is okay, leave the incision uncovered. Use a bandage (dressing) if the incision is draining fluid or appears irritated. If the adhesive strips across the incision do not fall off within 7 days, carefully peel them off.   Hug a pillow when coughing or sneezing until your incision is healed. This helps to relieve pain.   Do not use tampons or douche until your health care provider says it is okay.   Shower, wash your hair, and take tub baths as directed by your health care provider.   Wear a well-fitting bra that provides breast support.   Limit wearing support panties or control-top hose.   Drink enough fluids to keep your urine clear or pale yellow.   Eat high-fiber foods such as whole grain cereals and breads, brown rice, beans, and fresh fruits and vegetables every day. These foods may help prevent or relieve constipation.   Resume activities such as climbing stairs,  driving, lifting, exercising, or traveling as directed by your health care provider.   Talk to your health care provider about resuming sexual activities. This is dependent upon your risk of infection, your rate of healing, and your comfort and desire to resume sexual activity.   Try to have someone help you with your household activities and your newborn for at least a few days after you leave the hospital.   Rest as much as possible. Try to rest or take a nap when your newborn is sleeping.   Increase your activities gradually.   Keep all of your scheduled postpartum appointments. It is very important to keep your scheduled follow-up appointments. At these appointments, your health care provider will be checking to make sure that you are healing physically and emotionally.  SEEK MEDICAL CARE IF:    You are passing large clots from your vagina. Save any clots to show your health care provider.   You have a foul smelling discharge from your vagina.   You have trouble urinating.   You are urinating frequently.   You have pain when you urinate.   You have a change in your bowel movements.   You have increasing redness, pain, or swelling near your incision.   You have pus draining from your incision.   Your incision is separating.   You have painful, hard, or reddened breasts.   You have a severe headache.   You have blurred vision or see spots.   You feel sad   or depressed.   You have thoughts of hurting yourself or your newborn.   You have questions about your care, the care of your newborn, or medications.   You are dizzy or light-headed.   You have a rash.   You have pain, redness, or swelling at the site of the removed intravenous access (IV) tube.   You have nausea or vomiting.   You stopped breastfeeding and have not had a menstrual period within 12 weeks of stopping.   You are not breastfeeding and have not had a menstrual period within 12 weeks of delivery.   You have a fever.  SEEK  IMMEDIATE MEDICAL CARE IF:   You have persistent pain.   You have chest pain.   You have shortness of breath.   You faint.   You have leg pain.   You have stomach pain.   Your vaginal bleeding saturates 2 or more sanitary pads in 1 hour.  MAKE SURE YOU:    Understand these instructions.   Will watch your condition.   Will get help right away if you are not doing well or get worse.     This information is not intended to replace advice given to you by your health care provider. Make sure you discuss any questions you have with your health care provider.     Document Released: 09/18/2001 Document Revised: 01/17/2014 Document Reviewed: 08/24/2011  Elsevier Interactive Patient Education 2016 [employer]

## 2015-11-20 NOTE — Addendum Note (Signed)
Addended by: [name], Lorrane Mccay L on: 11/20/2015 10:33 AM   Modules accepted: Orders

## 2015-11-24 ENCOUNTER — Encounter (HOSPITAL_COMMUNITY)
Admission: RE | Admit: 2015-11-24 | Discharge: 2015-11-24 | Disposition: A | Discharge status: Home / Self Care | Payer: Medicaid Other | Source: Ambulatory Visit | Attending: Obstetrics and Gynecology | Admitting: Obstetrics and Gynecology

## 2015-11-24 LAB — CBC
HEMATOCRIT: 31.4 % — AB (ref 36.0–46.0)
HEMOGLOBIN: 10.7 g/dL — AB (ref 12.0–15.0)
MCH: 30.4 pg (ref 26.0–34.0)
MCHC: 34.1 g/dL (ref 30.0–36.0)
MCV: 89.2 fL (ref 78.0–100.0)
Platelets: 311 10*3/uL (ref 150–400)
RBC: 3.52 MIL/uL — AB (ref 3.87–5.11)
RDW: 13.2 % (ref 11.5–15.5)
WBC: 10.7 10*3/uL — ABNORMAL HIGH (ref 4.0–10.5)

## 2015-11-24 LAB — TYPE AND SCREEN
ABO/RH(D): O POS
Antibody Screen: NEGATIVE

## 2015-11-24 NOTE — Patient Instructions (Signed)
20 Donna Gordon  11/24/2015   Your procedure is scheduled on:  07/25/2015  [facility] 0730 AM.  Pick up the phone at the desk and dial [phone].   Call this number if you have problems the morning of surgery: [phone]   Remember:   Do not eat food:After Midnight.  Do not drink clear liquids: After Midnight.  Take these medicines the morning of surgery with A SIP OF WATER: zantac, zoloft and please bring your inhaler   Do not wear jewelry, make-up or nail polish.  Do not wear lotions, powders, or perfumes. Do not wear deodorant.  Do not shave 48 hours prior to surgery.  Do not bring valuables to the hospital.  [facility] is not   responsible for any belongings or valuables brought to the hospital.  Contacts, dentures or bridgework may not be worn into surgery.  Leave suitcase in the car. After surgery it may be brought to your room.  For patients admitted to the hospital, checkout time is 11:00 AM the day of              discharge.   Patients discharged the day of surgery will not be allowed to drive             home.  Name and phone number of your driver: na  Special Instructions:   N/A   Please read over the following fact sheets that you were given:   Surgical Site Infection Prevention

## 2015-11-25 LAB — RPR: RPR: NONREACTIVE

## 2015-11-26 ENCOUNTER — Encounter (HOSPITAL_COMMUNITY): Payer: Self-pay | Admitting: *Deleted

## 2015-11-30 ENCOUNTER — Ambulatory Visit (INDEPENDENT_AMBULATORY_CARE_PROVIDER_SITE_OTHER): Payer: Self-pay | Admitting: Student

## 2015-11-30 ENCOUNTER — Encounter: Payer: Self-pay | Admitting: Student

## 2015-11-30 VITALS — BP 121/64 | HR 92 | Wt 179.7 lb

## 2015-11-30 DIAGNOSIS — Z3400 Encounter for supervision of normal first pregnancy, unspecified trimester: Secondary | ICD-10-CM

## 2015-11-30 DIAGNOSIS — Z3403 Encounter for supervision of normal first pregnancy, third trimester: Secondary | ICD-10-CM

## 2015-11-30 NOTE — Progress Notes (Signed)
   PRENATAL VISIT NOTE  Subjective:  Donna Gordon is a 35 y.o. G1P0000 at 3478w5d being seen today for ongoing prenatal care.  She is currently monitored for the following issues for this low-risk pregnancy and has Anxiety and depression; Irritable bowel syndrome; Supervision of normal pregnancy, antepartum; and Pregnancy with history of uterine myomectomy on her problem list.  Patient reports occasional contractions.  Contractions: Not present.  .  Movement: Present. Denies leaking of fluid.   The following portions of the patient's history were reviewed and updated as appropriate: allergies, current medications, past family history, past medical history, past social history, past surgical history and problem list. Problem list updated.  Objective:   Vitals:   11/30/15 0944 11/30/15 1001  BP: (!) 141/79 121/64  Pulse: 98 92  Weight: 179 lb 11.2 oz (81.5 kg)     Fetal Status: Fetal Heart Rate (bpm): 145   Movement: Present   Fundal height 37 cm  General:  Alert, oriented and cooperative. Patient is in no acute distress.  Skin: Skin is warm and dry. No rash noted.   Cardiovascular: Normal heart rate noted  Respiratory: Normal respiratory effort, no problems with respiration noted  Abdomen: Soft, gravid, appropriate for gestational age. Pain/Pressure: Present     Pelvic:  Cervical exam performed        0/50/-3  Extremities: Normal range of motion.     Mental Status: Normal mood and affect. Normal behavior. Normal judgment and thought content.   Assessment and Plan:  Pregnancy: G1P0000 at 6378w5d  1. Supervision of normal first pregnancy, antepartum - Pt scheduled for primary c/section 11/22   Term labor symptoms and general obstetric precautions including but not limited to vaginal bleeding, contractions, leaking of fluid and fetal movement were reviewed in detail with the patient. Please refer to After Visit Summary for other counseling recommendations.  No Follow-up on  file.   Judeth HornErin Naturi Alarid, NP

## 2015-11-30 NOTE — Patient Instructions (Signed)
Cesarean Delivery °Cesarean birth, or cesarean delivery, is the surgical delivery of a baby through an incision in the abdomen and the uterus. This may be referred to as a C-section. This procedure may be scheduled ahead of time, or it may be done in an emergency situation. °Tell a health care provider about: °· Any allergies you have. °· All medicines you are taking, including vitamins, herbs, eye drops, creams, and over-the-counter medicines. °· Any problems you or family members have had with anesthetic medicines. °· Any blood disorders you have. °· Any surgeries you have had. °· Any medical conditions you have. °· Whether you or any members of your family have a history of deep vein thrombosis (DVT) or pulmonary embolism (PE). °What are the risks? °Generally, this is a safe procedure. However, problems may occur, including: °· Infection. °· Bleeding. °· Allergic reactions to medicines. °· Damage to other structures or organs. °· Blood clots. °· Injury to your baby. ° °What happens before the procedure? °· Follow instructions from your health care provider about eating or drinking restrictions. °· Follow instructions from your health care provider about bathing before your procedure to help reduce your risk of infection. °· If you know that you are going to have a cesarean delivery, do not shave your pubic area. Shaving before the procedure may increase your risk of infection. °· Ask your health care provider about: °? Changing or stopping your regular medicines. This is especially important if you are taking diabetes medicines or blood thinners. °? Your pain management plan. This is especially important if you plan to breastfeed your baby. °? How long you will be in the hospital after the procedure. °? Any concerns you may have about receiving blood products if you need them during the procedure. °? Cord blood banking, if you plan to collect your baby’s umbilical cord blood. °· You may also want to ask your  health care provider: °? Whether you will be able to hold or breastfeed your baby while you are still in the operating room. °? Whether your baby can stay with you immediately after the procedure and during your recovery. °? Whether a family member or a person of your choice can go with you into the operating room and stay with you during the procedure, immediately after the procedure, and during your recovery. °· Plan to have someone drive you home when you are discharged from the hospital. °What happens during the procedure? °· Fetal monitors will be placed on your abdomen to monitor your heart rate and your baby's heart rate. °· Depending on the reason for your cesarean delivery, you may have a physical exam or additional testing, such as an ultrasound. °· An IV tube will be inserted into one of your veins. °· You may have your blood or urine tested. °· You will be given antibiotic medicine to help prevent infection. °· You may be given a special warming gown to wear to keep your temperature stable. °· Hair may be removed from your pubic area. °· The skin of your pubic area and lower abdomen will be cleaned with a germ-killing solution (antiseptic). °· A catheter may be inserted into your bladder through your urethra. This drains your urine during the procedure. °· You may be given one or more of the following: °? A medicine to numb the area (local anesthetic). °? A medicine to make you fall asleep (general anesthetic). °? A medicine (regional anesthetic) that is injected into your back or through a small   thin tube placed in your back (spinal anesthetic or epidural anesthetic). This numbs everything below the injection site and allows you to stay awake during your procedure. If this makes you feel nauseous, tell your health care provider. Medicines will be available to help reduce any nausea you may feel. °· An incision will be made in your abdomen, and then in your uterus. °· If you are awake during your  procedure, you may feel tugging and pulling in your abdomen, but you should not feel pain. If you feel pain, tell your health care provider immediately. °· Your baby will be removed from your uterus. You may feel more pressure or pushing while this happens. °· Immediately after birth, your baby will be dried and kept warm. You may be able to hold and breastfeed your baby. The umbilical cord may be clamped and cut during this time. °· Your placenta will be removed from your uterus. °· Your incisions will be closed with stitches (sutures). Staples, skin glue, or adhesive strips may also be applied to the incision in your abdomen. °· Bandages (dressings) will be placed over the incision in your abdomen. °The procedure may vary among health care providers and hospitals. °What happens after the procedure? °· Your blood pressure, heart rate, breathing rate, and blood oxygen level will be monitored often until the medicines you were given have worn off. °· You may continue to receive fluids and medicines through an IV tube. °· You will have some pain. Medicines will be available to help control your pain. °· To help prevent blood clots: °? You may be given medicines. °? You may have to wear compression stockings or devices. °? You will be encouraged to walk around when you are able. °· Hospital staff will encourage and support bonding with your baby. Your hospital may allow you and your baby to stay in the same room (rooming in) during your hospital stay to encourage successful breastfeeding. °· You may be encouraged to cough and breathe deeply often. This helps to prevent lung problems. °· If you have a catheter draining your urine, it will be removed as soon as possible after your procedure. °This information is not intended to replace advice given to you by your health care provider. Make sure you discuss any questions you have with your health care provider. °Document Released: 12/27/2004 Document Revised: 06/04/2015  Document Reviewed: 10/07/2014 °Elsevier Interactive Patient Education © 2017 Elsevier Inc. ° °

## 2015-12-01 ENCOUNTER — Encounter (HOSPITAL_COMMUNITY)
Admission: RE | Admit: 2015-12-01 | Payer: Medicaid Other | Source: Ambulatory Visit | Attending: Obstetrics and Gynecology | Admitting: Obstetrics and Gynecology

## 2015-12-02 ENCOUNTER — Inpatient Hospital Stay (HOSPITAL_COMMUNITY)
Admission: RE | Admit: 2015-12-02 | Discharge: 2015-12-04 | DRG: 766 | Disposition: A | Discharge status: Home / Self Care | Payer: Medicaid Other | Source: Ambulatory Visit | Attending: Family Medicine | Admitting: Family Medicine

## 2015-12-02 ENCOUNTER — Inpatient Hospital Stay (HOSPITAL_COMMUNITY): Payer: Medicaid Other | Admitting: Certified Registered"

## 2015-12-02 ENCOUNTER — Encounter (HOSPITAL_COMMUNITY)
Admission: RE | Disposition: A | Discharge status: Home / Self Care | Payer: Self-pay | Source: Ambulatory Visit | Attending: Family Medicine

## 2015-12-02 ENCOUNTER — Encounter (HOSPITAL_COMMUNITY): Payer: Self-pay

## 2015-12-02 DIAGNOSIS — F418 Other specified anxiety disorders: Secondary | ICD-10-CM | POA: Diagnosis present

## 2015-12-02 DIAGNOSIS — K589 Irritable bowel syndrome without diarrhea: Secondary | ICD-10-CM | POA: Diagnosis present

## 2015-12-02 DIAGNOSIS — Z98891 History of uterine scar from previous surgery: Secondary | ICD-10-CM

## 2015-12-02 DIAGNOSIS — Z3490 Encounter for supervision of normal pregnancy, unspecified, unspecified trimester: Secondary | ICD-10-CM

## 2015-12-02 DIAGNOSIS — O99344 Other mental disorders complicating childbirth: Secondary | ICD-10-CM | POA: Diagnosis present

## 2015-12-02 DIAGNOSIS — Z3A37 37 weeks gestation of pregnancy: Secondary | ICD-10-CM

## 2015-12-02 DIAGNOSIS — O3429 Maternal care due to uterine scar from other previous surgery: Secondary | ICD-10-CM | POA: Diagnosis present

## 2015-12-02 DIAGNOSIS — J45909 Unspecified asthma, uncomplicated: Secondary | ICD-10-CM | POA: Diagnosis present

## 2015-12-02 DIAGNOSIS — Z87891 Personal history of nicotine dependence: Secondary | ICD-10-CM | POA: Diagnosis not present

## 2015-12-02 DIAGNOSIS — Z3400 Encounter for supervision of normal first pregnancy, unspecified trimester: Secondary | ICD-10-CM

## 2015-12-02 DIAGNOSIS — O3483 Maternal care for other abnormalities of pelvic organs, third trimester: Secondary | ICD-10-CM

## 2015-12-02 DIAGNOSIS — F329 Major depressive disorder, single episode, unspecified: Secondary | ICD-10-CM | POA: Diagnosis present

## 2015-12-02 DIAGNOSIS — F32A Depression, unspecified: Secondary | ICD-10-CM | POA: Diagnosis present

## 2015-12-02 DIAGNOSIS — O9952 Diseases of the respiratory system complicating childbirth: Secondary | ICD-10-CM | POA: Diagnosis present

## 2015-12-02 DIAGNOSIS — O9962 Diseases of the digestive system complicating childbirth: Secondary | ICD-10-CM | POA: Diagnosis present

## 2015-12-02 DIAGNOSIS — F419 Anxiety disorder, unspecified: Secondary | ICD-10-CM | POA: Diagnosis present

## 2015-12-02 LAB — CBC
HEMATOCRIT: 32.2 % — AB (ref 36.0–46.0)
Hemoglobin: 11 g/dL — ABNORMAL LOW (ref 12.0–15.0)
MCH: 30.3 pg (ref 26.0–34.0)
MCHC: 34.2 g/dL (ref 30.0–36.0)
MCV: 88.7 fL (ref 78.0–100.0)
Platelets: 336 10*3/uL (ref 150–400)
RBC: 3.63 MIL/uL — AB (ref 3.87–5.11)
RDW: 13.2 % (ref 11.5–15.5)
WBC: 10 10*3/uL (ref 4.0–10.5)

## 2015-12-02 LAB — TYPE AND SCREEN
ABO/RH(D): O POS
ANTIBODY SCREEN: NEGATIVE

## 2015-12-02 LAB — PREPARE RBC (CROSSMATCH)

## 2015-12-02 SURGERY — Surgical Case
Anesthesia: Monitor Anesthesia Care | Site: Abdomen | Wound class: Clean Contaminated

## 2015-12-02 MED ORDER — TETANUS-DIPHTH-ACELL PERTUSSIS 5-2.5-18.5 LF-MCG/0.5 IM SUSP: 0.5000 mL | Freq: Once | INTRAMUSCULAR | Status: DC

## 2015-12-02 MED ORDER — KETOROLAC TROMETHAMINE 30 MG/ML IJ SOLN
30.0000 mg | Freq: Four times a day (QID) | INTRAMUSCULAR | Status: AC | PRN
  Administered 2015-12-02 – 2015-12-03 (×3): 30 mg via INTRAVENOUS
  Filled 2015-12-02 (×3): qty 1

## 2015-12-02 MED ORDER — ONDANSETRON HCL 4 MG/2ML IJ SOLN
INTRAMUSCULAR | Status: AC
  Filled 2015-12-02: qty 2

## 2015-12-02 MED ORDER — MEPERIDINE HCL 25 MG/ML IJ SOLN: 6.2500 mg | INTRAMUSCULAR | Status: DC | PRN

## 2015-12-02 MED ORDER — COCONUT OIL OIL: 1.0000 "application " | TOPICAL_OIL | Status: DC | PRN

## 2015-12-02 MED ORDER — FENTANYL CITRATE (PF) 100 MCG/2ML IJ SOLN
INTRAMUSCULAR | Status: AC
  Filled 2015-12-02: qty 2

## 2015-12-02 MED ORDER — CEFAZOLIN SODIUM-DEXTROSE 2-4 GM/100ML-% IV SOLN
2.0000 g | INTRAVENOUS | Status: AC
  Administered 2015-12-02: 2 g via INTRAVENOUS

## 2015-12-02 MED ORDER — OXYTOCIN 40 UNITS IN LACTATED RINGERS INFUSION - SIMPLE MED: 2.5000 [IU]/h | INTRAVENOUS | Status: AC

## 2015-12-02 MED ORDER — LACTATED RINGERS IV SOLN
INTRAVENOUS | Status: DC | PRN
  Administered 2015-12-02: 13:00:00 via INTRAVENOUS

## 2015-12-02 MED ORDER — DIPHENHYDRAMINE HCL 50 MG/ML IJ SOLN: 12.5000 mg | INTRAMUSCULAR | Status: DC | PRN

## 2015-12-02 MED ORDER — BUPIVACAINE HCL 0.25 % IJ SOLN
INTRAMUSCULAR | Status: DC | PRN
  Administered 2015-12-02: 30 mL

## 2015-12-02 MED ORDER — OXYTOCIN 10 UNIT/ML IJ SOLN
INTRAMUSCULAR | Status: AC
  Filled 2015-12-02: qty 4

## 2015-12-02 MED ORDER — FENTANYL CITRATE (PF) 100 MCG/2ML IJ SOLN
INTRAMUSCULAR | Status: DC | PRN
  Administered 2015-12-02: 10 ug via INTRATHECAL

## 2015-12-02 MED ORDER — ONDANSETRON HCL 4 MG/2ML IJ SOLN
INTRAMUSCULAR | Status: DC | PRN
  Administered 2015-12-02: 4 mg via INTRAVENOUS

## 2015-12-02 MED ORDER — PHENYLEPHRINE 40 MCG/ML (10ML) SYRINGE FOR IV PUSH (FOR BLOOD PRESSURE SUPPORT)
PREFILLED_SYRINGE | INTRAVENOUS | Status: AC
  Filled 2015-12-02: qty 10

## 2015-12-02 MED ORDER — NALOXONE HCL 2 MG/2ML IJ SOSY
1.0000 ug/kg/h | PREFILLED_SYRINGE | INTRAMUSCULAR | Status: DC | PRN
  Filled 2015-12-02: qty 2

## 2015-12-02 MED ORDER — ACETAMINOPHEN 325 MG PO TABS: 650.0000 mg | ORAL_TABLET | ORAL | Status: DC | PRN

## 2015-12-02 MED ORDER — SIMETHICONE 80 MG PO CHEW: 80.0000 mg | CHEWABLE_TABLET | ORAL | Status: DC | PRN

## 2015-12-02 MED ORDER — MORPHINE SULFATE (PF) 0.5 MG/ML IJ SOLN
INTRAMUSCULAR | Status: DC | PRN
  Administered 2015-12-02: .2 mg via INTRAVENOUS

## 2015-12-02 MED ORDER — MORPHINE SULFATE-NACL 0.5-0.9 MG/ML-% IV SOSY
PREFILLED_SYRINGE | INTRAVENOUS | Status: AC
  Filled 2015-12-02: qty 1

## 2015-12-02 MED ORDER — ZOLPIDEM TARTRATE 5 MG PO TABS: 5.0000 mg | ORAL_TABLET | Freq: Every evening | ORAL | Status: DC | PRN

## 2015-12-02 MED ORDER — SENNOSIDES-DOCUSATE SODIUM 8.6-50 MG PO TABS
2.0000 | ORAL_TABLET | ORAL | Status: DC
Start: 2015-12-03 — End: 2015-12-04
  Administered 2015-12-02 – 2015-12-03 (×2): 2 via ORAL
  Filled 2015-12-02 (×2): qty 2

## 2015-12-02 MED ORDER — NALBUPHINE HCL 10 MG/ML IJ SOLN: 5.0000 mg | INTRAMUSCULAR | Status: DC | PRN

## 2015-12-02 MED ORDER — MENTHOL 3 MG MT LOZG: 1.0000 | LOZENGE | OROMUCOSAL | Status: DC | PRN

## 2015-12-02 MED ORDER — ONDANSETRON HCL 4 MG/2ML IJ SOLN: 4.0000 mg | Freq: Three times a day (TID) | INTRAMUSCULAR | Status: DC | PRN

## 2015-12-02 MED ORDER — IBUPROFEN 600 MG PO TABS
600.0000 mg | ORAL_TABLET | Freq: Four times a day (QID) | ORAL | Status: DC
  Administered 2015-12-03 – 2015-12-04 (×5): 600 mg via ORAL
  Filled 2015-12-02 (×5): qty 1

## 2015-12-02 MED ORDER — SIMETHICONE 80 MG PO CHEW
80.0000 mg | CHEWABLE_TABLET | Freq: Three times a day (TID) | ORAL | Status: DC
  Administered 2015-12-03 – 2015-12-04 (×5): 80 mg via ORAL
  Filled 2015-12-02 (×5): qty 1

## 2015-12-02 MED ORDER — KETOROLAC TROMETHAMINE 30 MG/ML IJ SOLN: 30.0000 mg | Freq: Four times a day (QID) | INTRAMUSCULAR | Status: AC | PRN

## 2015-12-02 MED ORDER — HYDROMORPHONE HCL 1 MG/ML IJ SOLN: 0.2500 mg | INTRAMUSCULAR | Status: DC | PRN

## 2015-12-02 MED ORDER — SODIUM CHLORIDE 0.9% FLUSH: 3.0000 mL | INTRAVENOUS | Status: DC | PRN

## 2015-12-02 MED ORDER — BUPIVACAINE HCL (PF) 0.25 % IJ SOLN
INTRAMUSCULAR | Status: AC
  Filled 2015-12-02: qty 30

## 2015-12-02 MED ORDER — SCOPOLAMINE 1 MG/3DAYS TD PT72
MEDICATED_PATCH | TRANSDERMAL | Status: AC
  Filled 2015-12-02: qty 1

## 2015-12-02 MED ORDER — PRENATAL MULTIVITAMIN CH
1.0000 | ORAL_TABLET | Freq: Every day | ORAL | Status: DC
  Administered 2015-12-03 – 2015-12-04 (×2): 1 via ORAL
  Filled 2015-12-02 (×2): qty 1

## 2015-12-02 MED ORDER — NALBUPHINE HCL 10 MG/ML IJ SOLN
5.0000 mg | Freq: Once | INTRAMUSCULAR | Status: DC | PRN
  Filled 2015-12-02: qty 1

## 2015-12-02 MED ORDER — NALBUPHINE HCL 10 MG/ML IJ SOLN
5.0000 mg | INTRAMUSCULAR | Status: DC | PRN
  Administered 2015-12-02: 5 mg via INTRAVENOUS

## 2015-12-02 MED ORDER — SIMETHICONE 80 MG PO CHEW
80.0000 mg | CHEWABLE_TABLET | ORAL | Status: DC
  Administered 2015-12-02 – 2015-12-03 (×2): 80 mg via ORAL
  Filled 2015-12-02 (×2): qty 1

## 2015-12-02 MED ORDER — SCOPOLAMINE 1 MG/3DAYS TD PT72: 1.0000 | MEDICATED_PATCH | Freq: Once | TRANSDERMAL | Status: DC

## 2015-12-02 MED ORDER — OXYCODONE-ACETAMINOPHEN 5-325 MG PO TABS
2.0000 | ORAL_TABLET | ORAL | Status: DC | PRN
  Administered 2015-12-03 (×2): 2 via ORAL
  Filled 2015-12-02 (×2): qty 2

## 2015-12-02 MED ORDER — LACTATED RINGERS IV SOLN
Freq: Once | INTRAVENOUS | Status: AC
  Administered 2015-12-02 (×2): via INTRAVENOUS

## 2015-12-02 MED ORDER — SCOPOLAMINE 1 MG/3DAYS TD PT72
1.0000 | MEDICATED_PATCH | Freq: Once | TRANSDERMAL | Status: DC
  Administered 2015-12-02: 1.5 mg via TRANSDERMAL

## 2015-12-02 MED ORDER — LACTATED RINGERS IV SOLN
INTRAVENOUS | Status: DC
  Administered 2015-12-02: 17:00:00 via INTRAVENOUS

## 2015-12-02 MED ORDER — PHENYLEPHRINE 8 MG IN D5W 100 ML (0.08MG/ML) PREMIX OPTIME
INJECTION | INTRAVENOUS | Status: DC | PRN
  Administered 2015-12-02: 60 ug/min via INTRAVENOUS

## 2015-12-02 MED ORDER — LACTATED RINGERS IV SOLN
INTRAVENOUS | Status: DC
  Administered 2015-12-02: 12:00:00 via INTRAVENOUS

## 2015-12-02 MED ORDER — NALOXONE HCL 0.4 MG/ML IJ SOLN: 0.4000 mg | INTRAMUSCULAR | Status: DC | PRN

## 2015-12-02 MED ORDER — PHENYLEPHRINE HCL 10 MG/ML IJ SOLN
INTRAMUSCULAR | Status: DC | PRN
  Administered 2015-12-02: 40 ug via INTRAVENOUS

## 2015-12-02 MED ORDER — OXYTOCIN 10 UNIT/ML IJ SOLN
INTRAMUSCULAR | Status: DC | PRN
  Administered 2015-12-02: 40 [IU] via INTRAVENOUS

## 2015-12-02 MED ORDER — DIPHENHYDRAMINE HCL 25 MG PO CAPS: 25.0000 mg | ORAL_CAPSULE | ORAL | Status: DC | PRN

## 2015-12-02 MED ORDER — DIBUCAINE 1 % RE OINT: 1.0000 "application " | TOPICAL_OINTMENT | RECTAL | Status: DC | PRN

## 2015-12-02 MED ORDER — DIPHENHYDRAMINE HCL 25 MG PO CAPS: 25.0000 mg | ORAL_CAPSULE | Freq: Four times a day (QID) | ORAL | Status: DC | PRN

## 2015-12-02 MED ORDER — PHENYLEPHRINE 8 MG IN D5W 100 ML (0.08MG/ML) PREMIX OPTIME
INJECTION | INTRAVENOUS | Status: AC
  Filled 2015-12-02: qty 100

## 2015-12-02 MED ORDER — PROMETHAZINE HCL 25 MG/ML IJ SOLN: 6.2500 mg | INTRAMUSCULAR | Status: DC | PRN

## 2015-12-02 MED ORDER — NALBUPHINE HCL 10 MG/ML IJ SOLN: 5.0000 mg | Freq: Once | INTRAMUSCULAR | Status: DC | PRN

## 2015-12-02 MED ORDER — OXYCODONE-ACETAMINOPHEN 5-325 MG PO TABS
1.0000 | ORAL_TABLET | ORAL | Status: DC | PRN
  Administered 2015-12-03: 1 via ORAL
  Filled 2015-12-02: qty 1

## 2015-12-02 MED ORDER — BUPIVACAINE IN DEXTROSE 0.75-8.25 % IT SOLN
INTRATHECAL | Status: DC | PRN
  Administered 2015-12-02: 12 mL via INTRATHECAL

## 2015-12-02 MED ORDER — WITCH HAZEL-GLYCERIN EX PADS: 1.0000 "application " | MEDICATED_PAD | CUTANEOUS | Status: DC | PRN

## 2015-12-02 SURGICAL SUPPLY — 27 items
APL SKNCLS STERI-STRIP NONHPOA (GAUZE/BANDAGES/DRESSINGS) ×1
BENZOIN TINCTURE PRP APPL 2/3 (GAUZE/BANDAGES/DRESSINGS) ×2 IMPLANT
CHLORAPREP W/TINT 26ML (MISCELLANEOUS) ×2 IMPLANT
CLOTH BEACON ORANGE TIMEOUT ST (SAFETY) ×2 IMPLANT
DRSG OPSITE POSTOP 4X10 (GAUZE/BANDAGES/DRESSINGS) ×2 IMPLANT
ELECT REM PT RETURN 9FT ADLT (ELECTROSURGICAL) ×2
ELECTRODE REM PT RTRN 9FT ADLT (ELECTROSURGICAL) ×1 IMPLANT
GLOVE BIOGEL PI IND STRL 7.0 (GLOVE) ×3 IMPLANT
GLOVE BIOGEL PI INDICATOR 7.0 (GLOVE) ×3
GLOVE ECLIPSE 6.5 STRL STRAW (GLOVE) ×2 IMPLANT
GOWN STRL REUS W/ TWL LRG LVL3 (GOWN DISPOSABLE) ×2 IMPLANT
GOWN STRL REUS W/TWL LRG LVL3 (GOWN DISPOSABLE) ×4
NEEDLE HYPO 22GX1.5 SAFETY (NEEDLE) ×2 IMPLANT
NS IRRIG 1000ML POUR BTL (IV SOLUTION) ×2 IMPLANT
PAD OB MATERNITY 4.3X12.25 (PERSONAL CARE ITEMS) ×2 IMPLANT
PAD PREP 24X48 CUFFED NSTRL (MISCELLANEOUS) ×2 IMPLANT
RETRACTOR WND ALEXIS 25 LRG (MISCELLANEOUS) IMPLANT
RTRCTR WOUND ALEXIS 25CM LRG (MISCELLANEOUS)
STRIP CLOSURE SKIN 1/2X4 (GAUZE/BANDAGES/DRESSINGS) ×2 IMPLANT
SUT PLAIN 2 0 XLH (SUTURE) ×2 IMPLANT
SUT VIC AB 0 CT1 36 (SUTURE) ×4 IMPLANT
SUT VIC AB 2-0 CT1 27 (SUTURE) ×2
SUT VIC AB 2-0 CT1 TAPERPNT 27 (SUTURE) ×1 IMPLANT
SUT VIC AB 4-0 KS 27 (SUTURE) ×2 IMPLANT
SYR CONTROL 10ML LL (SYRINGE) ×2 IMPLANT
TOWEL OR 17X24 6PK STRL BLUE (TOWEL DISPOSABLE) ×6 IMPLANT
TRAY FOLEY CATH SILVER 16FR (SET/KITS/TRAYS/PACK) ×2 IMPLANT

## 2015-12-02 NOTE — Anesthesia Postprocedure Evaluation (Signed)
Anesthesia Post Note  Patient: Donna Gordon  Procedure(s) Performed: Procedure(s) (LRB): CESAREAN SECTION (N/A)  Patient location during evaluation: PACU Anesthesia Type: Spinal and MAC Level of consciousness: awake and alert Pain management: pain level controlled Vital Signs Assessment: post-procedure vital signs reviewed and stable Respiratory status: spontaneous breathing and respiratory function stable Cardiovascular status: blood pressure returned to baseline and stable Postop Assessment: spinal receding Anesthetic complications: no     Last Vitals:  Vitals:   12/02/15 1445 12/02/15 1500  BP: (!) 106/53 (!) 108/55  Pulse: (!) 54 69  Resp: 19 (!) 22  Temp:      Last Pain:  Vitals:   12/02/15 1430  TempSrc: Oral   Pain Goal: Patients Stated Pain Goal: 3 (12/02/15 1039)               Kory Panjwani DANIEL

## 2015-12-02 NOTE — Lactation Note (Signed)
This note was copied from a baby's chart. Lactation Consultation Note  Patient Name: Donna Blanche EastJulianne Dahmer EAVWU'JToday's Date: 12/02/2015 Reason for consult: Initial assessment  Initial visit at 6 hours of life. Mom is a P1 s/p C-Section. Mom reports considerable breast changes w/pregnancy. She is aware that sometimes 37 week infants do not do well on the breast, but I reassured Mom that we will continue to monitor.   Since Mom offered the breast recently, but infant wasn't interested, I will return & teach Mom hand expression so we can spoon-feed some colostrum to infant. Mom has my # to call when ready for me to return.  Mom has a hx of anx/depr. She began Zoloft (L1) almost 2 weeks ago.   Lurline HareRichey, Libby [address] [facility] 12/02/2015, 8:24 PM

## 2015-12-02 NOTE — Anesthesia Procedure Notes (Signed)
Spinal  Patient location during procedure: OR Start time: 12/02/2015 12:53 PM End time: 12/02/2015 1:03 PM Staffing Anesthesiologist: Heather RobertsSINGER, Barrie Wale Performed: anesthesiologist  Preanesthetic Checklist Completed: patient identified, surgical consent, pre-op evaluation, timeout performed, IV checked, risks and benefits discussed and monitors and equipment checked Spinal Block Patient position: sitting Prep: DuraPrep Patient monitoring: cardiac monitor, continuous pulse ox and blood pressure Approach: midline Location: L2-3 Injection technique: single-shot Needle Needle type: Pencan  Needle gauge: 24 G Needle length: 9 cm Additional Notes Functioning IV was confirmed and monitors were applied. Sterile prep and drape, including hand hygiene and sterile gloves were used. The patient was positioned and the spine was prepped. The skin was anesthetized with lidocaine.  Free flow of clear CSF was obtained prior to injecting local anesthetic into the CSF.  The spinal needle aspirated freely following injection.  The needle was carefully withdrawn.  The patient tolerated the procedure well.

## 2015-12-02 NOTE — Addendum Note (Signed)
Addendum  created 12/02/15 1534 by Heather RobertsJames Etosha Wetherell, MD   Delete clinical note

## 2015-12-02 NOTE — Op Note (Signed)
Donna Gordon PROCEDURE DATE: 12/02/2015  PREOPERATIVE DIAGNOSES: Intrauterine pregnancy at 3054w0d weeks gestation; previous myomectomy  POSTOPERATIVE DIAGNOSES: The same  PROCEDURE: Primary Low Transverse Cesarean Section  SURGEON:  Dr. Lyndel SafeKimberly Jaysean Manville  ASSISTANT:  Mamie Leversracy Tucker RN  INDICATIONS: Donna Gordon is a 35 y.o. G1P0000 at 2654w0d here for cesarean section secondary to the indications listed under preoperative diagnoses; please see preoperative note for further details.  The risks of cesarean section were discussed with the patient including but were not limited to: bleeding which may require transfusion or reoperation; infection which may require antibiotics; injury to bowel, bladder, ureters or other surrounding organs; injury to the fetus; need for additional procedures including hysterectomy in the event of a life-threatening hemorrhage; placental abnormalities wth subsequent pregnancies, incisional problems, thromboembolic phenomenon and other postoperative/anesthesia complications.   The patient concurred with the proposed plan, giving informed written consent for the procedure.    FINDINGS:  Viable female infant in cephalic presentation.  Apgars 8 and 9.  Clear amniotic fluid.  Intact placenta, three vessel cord.  Normal uterus, fallopian tubes and ovaries bilaterally. Uterus was rotated to the left and adhesion of peritoneum to rectus on right. Mild adhesive diease with a thin lower uterine segment.   ANESTHESIA: Spinal INTRAVENOUS FLUIDS: 3200 ml ESTIMATED BLOOD LOSS: 1050 ml URINE OUTPUT:  75ml SPECIMENS: L&D COMPLICATIONS: None immediate  PROCEDURE IN DETAIL:  The patient preoperatively received intravenous antibiotics and had sequential compression devices applied to her lower extremities.  She was then taken to the operating room where spinal anesthesia was administered and was found to be adequate. She was then placed in a dorsal supine position with a leftward  tilt, and prepped and draped in a sterile manner.  A foley catheter was placed into her bladder and attached to constant gravity.  After an adequate timeout was performed, a Pfannenstiel skin incision was made with scalpel and carried through to the underlying layer of fascia. The fascia was incised in the midline, and this incision was extended bilaterally using the Mayo scissors.  Kocher clamps were applied to the superior aspect of the fascial incision and the underlying rectus muscles were dissected off bluntly. A similar process was carried out on the inferior aspect of the fascial incision. The rectus muscles were separated in the midline bluntly and the peritoneum was entered bluntly. Attention was turned to the lower uterine segment where a low transverse hysterotomy was made with a scalpel and extended bilaterally bluntly.  The infant was successfully delivered, the cord was clamped and cut and the infant was handed over to awaiting neonatology team. Uterine massage was then administered, and the placenta delivered intact with a three-vessel cord. The uterus was then cleared of clot and debris.  The hysterotomy was closed with 0 Vicryl in a running locked fashion, and an imbricating layer was also placed with 0 Vicryl .Figure-of-eight serosal stitches were placed to help with hemostasis.  There was increased bleeding at the right hysterotomy edge and figure-of-eight sutures were placed in this area. The pelvis was cleared of all clot and debris. Hemostasis was confirmed on all surfaces.  The rectus muscles were reapproximated using 0 Vicryl interrupted stitches. The fascia was then closed using 0 Vicryl in a running fashion.  The subcutaneous layer was not placed and 30 ml of 0.5% Marcaine was injected subcutaneously around the incision.  The skin was closed with a 4-0 Vicryl subcuticular stitch. The patient tolerated the procedure well. Sponge, lap, instrument and needle counts were  correct x 2.  She was  taken to the recovery room in stable condition.   Donna FlakeKimberly Niles Aliea Bobe, MD, MPH, ABFM Attending Physician Faculty Practice- [facility]

## 2015-12-02 NOTE — Progress Notes (Signed)
MOB was referred for history of depression/anxiety. * Referral screened out by Clinical Social Worker because none of the following criteria appear to apply: ~ History of anxiety/depression during this pregnancy, or of post-partum depression. ~ Diagnosis of anxiety and/or depression within last 3 years OR * MOB's symptoms currently being treated with medication and/or therapy. Please contact the Clinical Social Worker if needs arise, or if MOB requests.  Alenah Sarria Boyd-Gilyard, MSW, LCSW Clinical Social Work [phone] 

## 2015-12-02 NOTE — Transfer of Care (Signed)
Immediate Anesthesia Transfer of Care Note  Patient: Youth workerJulianne Gordon  Procedure(s) Performed: Procedure(s): CESAREAN SECTION (N/A)  Patient Location: PACU  Anesthesia Type:Spinal  Level of Consciousness: awake, alert  and oriented  Airway & Oxygen Therapy: Patient Spontanous Breathing  Post-op Assessment: Report given to RN and Post -op Vital signs reviewed and stable  Post vital signs: Reviewed and stable  Last Vitals:  Vitals:   12/02/15 1039  BP: 119/80  Pulse: 84  Resp: (!) 22  Temp: 36.6 C    Last Pain:  Vitals:   12/02/15 1039  TempSrc: Oral      Patients Stated Pain Goal: 3 (12/02/15 1039)  Complications: No apparent anesthesia complications

## 2015-12-02 NOTE — H&P (Signed)
Obstetric Preoperative History and Physical  Blanche EastJulianne Banko is a 35 y.o. G1P0000 with IUP at 8050w0d presenting for presenting for scheduled cesarean section.  No acute concerns.   Prenatal Course Source of Care: [facility]  with onset of care at first trimester Pregnancy complications or risks: Patient Active Problem List   Diagnosis Date Noted  . Supervision of normal pregnancy, antepartum 05/12/2015  . Pregnancy with history of uterine myomectomy 05/12/2015  . Irritable bowel syndrome 08/05/2011  . Anxiety and depression 02/21/2011   She plans to breastfeed She desires undecided for postpartum contraception.   Prenatal labs and studies: ABO, Rh: --/--/O POS (11/14 1150) Antibody: NEG (11/14 1150) Rubella: 1.71 (05/02 0909) RPR: Non Reactive (11/14 1150)  HBsAg: NEGATIVE (05/02 0909)  HIV: NONREACTIVE (09/14 0001)  GBS:  NEG 1 hr Glucola = 113 Genetic screening normal Anatomy US normal  Prenatal Transfer Tool  Maternal Diabetes: No Genetic Screening: Normal Maternal Ultrasounds/Referrals: Normal Fetal Ultrasounds or other Referrals:  None Maternal Substance Abuse:  No Significant Maternal Medications:  None Significant Maternal Lab Results: Lab values include: Group B Strep negative  Past Medical History:  Diagnosis Date  . Asthma   . Depression   . Fibroid   . History of uterine fibroid   . Ovarian cyst 2013    Past Surgical History:  Procedure Laterality Date  . CHOLECYSTECTOMY    . MYOMECTOMY ABDOMINAL APPROACH  08/06/2007  . TUMOR REMOVAL    . UTERINE FIBROID SURGERY      OB History  Gravida Para Term Preterm AB Living  1 0 0 0 0 0  SAB TAB Ectopic Multiple Live Births  0 0 0 0      # Outcome Date GA Lbr Len/2nd Weight Sex Delivery Anes PTL Lv  1 Current               Social History   Social History  . Marital status: Married    Spouse name: N/A  . Number of children: N/A  . Years of education: N/A   Social History Main Topics  . Smoking  status: Former Smoker    Packs/day: 0.00  . Smokeless tobacco: Never Used  . Alcohol use No  . Drug use: No  . Sexual activity: Yes   Other Topics Concern  . None   Social History Narrative  . None    Family History  Problem Relation Age of Onset  . Cancer Paternal Aunt 31    pancreatic   . Cancer Maternal Grandmother [id number]    breast  . Other Neg Hx     Prescriptions Prior to Admission  Medication Sig Dispense Refill Last Dose  . ranitidine (ZANTAC) 75 MG tablet Take 75 mg by mouth 2 (two) times daily.   12/02/2015 at 0730  . sertraline (ZOLOFT) 50 MG tablet 1/2 tablet qd x 7 days and then increase to 1 whole tablet qd 30 tablet 5 12/02/2015 at 0730  . albuterol (PROVENTIL HFA;VENTOLIN HFA) 108 (90 Base) MCG/ACT inhaler Inhale 1 puff into the lungs every 6 (six) hours as needed for wheezing or shortness of breath.   Unknown at Unknown time  . Prenatal Vit-Fe Fumarate-FA (PRENATAL MULTIVITAMIN) TABS tablet Take 1 tablet by mouth daily at 12 noon.   Unknown at Unknown time    No Known Allergies  Review of Systems: Negative except for what is mentioned in HPI.  Physical Exam: BP 119/80   Pulse 84   Temp 97.8 F (36.6 C) (Oral)  Resp (!) 22   LMP 02/14/2015 (Exact Date)   SpO2 100%  FHR by Doppler: 132 bpm CONSTITUTIONAL: Well-developed, well-nourished female in no acute distress.  HENT:  Normocephalic, atraumatic, External right and left ear normal. Oropharynx is clear and moist EYES: Conjunctivae and EOM are normal. Pupils are equal, round, and reactive to light. No scleral icterus.  NECK: Normal range of motion, supple, no masses SKIN: Skin is warm and dry. No rash noted. Not diaphoretic. No erythema. No pallor. NEUROLGIC: Alert and oriented to person, place, and time. Normal reflexes, muscle tone coordination. No cranial nerve deficit noted. PSYCHIATRIC: Normal mood and affect. Normal behavior. Normal judgment and thought content. CARDIOVASCULAR: Normal heart rate  noted, regular rhythm RESPIRATORY: Effort and breath sounds normal, no problems with respiration noted ABDOMEN: Soft, nontender, nondistended, gravid. Well-healed abdominal incisions from myomectomy PELVIC: Deferred MUSCULOSKELETAL: Normal range of motion. No edema and no tenderness. 2+ distal pulses.   Pertinent Labs/Studies:   No results found for this or any previous visit (from the past 72 hour(s)).  Assessment and Plan :Blanche EastJulianne Space is a 35 y.o. G1P0000 at 6320w0d being admitted being admitted for scheduled cesarean section. The risks of cesarean section discussed with the patient included but were not limited to: bleeding which may require transfusion or reoperation; infection which may require antibiotics; injury to bowel, bladder, ureters or other surrounding organs; injury to the fetus; need for additional procedures including hysterectomy in the event of a life-threatening hemorrhage; placental abnormalities wth subsequent pregnancies, incisional problems, thromboembolic phenomenon and other postoperative/anesthesia complications. The patient concurred with the proposed plan, giving informed written consent for the procedure. Patient has been NPO since last night she will remain NPO for procedure. Anesthesia and OR aware. Preoperative prophylactic antibiotics and SCDs ordered on call to the OR. To OR when ready.    Federico FlakeKimberly Niles Newton, MD, MPH, ABFM Attending Physician Faculty Practice- [facility]

## 2015-12-02 NOTE — Anesthesia Preprocedure Evaluation (Signed)
Anesthesia Evaluation  Patient identified by MRN, date of birth, ID band Patient awake    Reviewed: Allergy & Precautions, NPO status , Patient's Chart, lab work & pertinent test results  History of Anesthesia Complications Negative for: history of anesthetic complications  Airway Mallampati: II  TM Distance: >3 FB Neck ROM: Full    Dental no notable dental hx. (+) Dental Advisory Given   Pulmonary asthma , former smoker,    Pulmonary exam normal        Cardiovascular negative cardio ROS Normal cardiovascular exam     Neuro/Psych PSYCHIATRIC DISORDERS Depression negative neurological ROS     GI/Hepatic negative GI ROS, Neg liver ROS,   Endo/Other  negative endocrine ROS  Renal/GU negative Renal ROS     Musculoskeletal negative musculoskeletal ROS (+)   Abdominal   Peds  Hematology negative hematology ROS (+)   Anesthesia Other Findings   Reproductive/Obstetrics                             Anesthesia Physical Anesthesia Plan  ASA: II  Anesthesia Plan: MAC and Spinal   Post-op Pain Management:    Induction:   Airway Management Planned: Natural Airway  Additional Equipment:   Intra-op Plan:   Post-operative Plan:   Informed Consent: I have reviewed the patients History and Physical, chart, labs and discussed the procedure including the risks, benefits and alternatives for the proposed anesthesia with the patient or authorized representative who has indicated his/her understanding and acceptance.   Dental advisory given  Plan Discussed with: CRNA and Anesthesiologist  Anesthesia Plan Comments:         Anesthesia Quick Evaluation

## 2015-12-03 LAB — CBC
HCT: 24.4 % — ABNORMAL LOW (ref 36.0–46.0)
Hemoglobin: 8.4 g/dL — ABNORMAL LOW (ref 12.0–15.0)
MCH: 31.1 pg (ref 26.0–34.0)
MCHC: 34.8 g/dL (ref 30.0–36.0)
MCV: 89.4 fL (ref 78.0–100.0)
PLATELETS: 241 10*3/uL (ref 150–400)
RBC: 2.73 MIL/uL — AB (ref 3.87–5.11)
RDW: 13.3 % (ref 11.5–15.5)
WBC: 11.5 10*3/uL — AB (ref 4.0–10.5)

## 2015-12-03 LAB — RPR: RPR: NONREACTIVE

## 2015-12-03 MED ORDER — POLYSACCHARIDE IRON COMPLEX 150 MG PO CAPS
150.0000 mg | ORAL_CAPSULE | Freq: Every day | ORAL | Status: DC
  Administered 2015-12-03 – 2015-12-04 (×2): 150 mg via ORAL
  Filled 2015-12-03 (×2): qty 1

## 2015-12-03 NOTE — Lactation Note (Addendum)
This note was copied from a baby's chart. Lactation Consultation Note Mom asking for formula. Mom feels the baby isn't getting enough milk. Mom gave the baby similac. Told the RN that "nobody is going to make her BF", visited mom, asked her concerns, answered questions. Explained it is her choice of how she wants to feed her baby. Staff is her to help her ever how she wishes to feed. BM is beneficial to the baby and the best to give the baby. Suggested to mom that she could BF then supplement w/formula. LPI information sheet given w/supplementation guidelines.  Mom states she has been leaking colostrum for months. Encouraged to give colostrum as supplement as well first before formula. Will offer DEBP to mom. Mom stated she would like to pump. Reported to RN to please set up pump.  Encouraged I&O, STS, educated on cluster feeding, supply and demand.  FOB was holding baby w/little dress on w/no sleeves or any blankets. Encouraged to keep baby wrapped for warmth. Reviewed LPI policy.  Discussed BF and colostrum importance. Patient Name: Donna Gordon ZOXWR'UToday's Date: 12/03/2015 Reason for consult: Follow-up assessment   Maternal Data    Feeding    LATCH Score/Interventions                      Lactation Tools Discussed/Used     Consult Status Consult Status: Follow-up Date: 12/03/15 (in pm) Follow-up type: In-patient    Charyl DancerCARVER, Judyann Casasola G 12/03/2015, 6:58 AM

## 2015-12-03 NOTE — Lactation Note (Signed)
This note was copied from a baby's chart. Lactation Consultation Note  Patient Name: Donna Gordon WUJWJ'XToday's Date: 12/03/2015 Reason for consult: Follow-up assessment   Follow up with mom of 20 hour old infant. Mom reports she has been giving bottles as infant not latching well. Infant just finished a bottle and was asleep. Asked mom is she wanted a pump set up and she agreed. Discussed supply and demand what to expect with pumping and enc mom to pump every 3 hours. DEBP set up, showed mom how to pump using Initiate setting. Mom was shown to assemble, disassemble, and clean pump parts. Mom reports she was shown how to hand express, enc her to do so after pumping. Advised mom to call if she would like assistance with latching infant. Follow up tomorrow and prn.    Maternal Data Has patient been taught Hand Expression?: Yes (per mom) Does the patient have breastfeeding experience prior to this delivery?: No  Feeding Feeding Type: Bottle Fed - Formula Nipple Type: Slow - flow  LATCH Score/Interventions                      Lactation Tools Discussed/Used Pump Review: Setup, frequency, and cleaning;Milk Storage Initiated by:: Noralee StainSharon Destiny Trickey, RN, IBCLC Date initiated:: 12/03/15   Consult Status Consult Status: Follow-up Date: 12/04/15 Follow-up type: In-patient    Donna Gordon 12/03/2015, 10:12 AM

## 2015-12-03 NOTE — Progress Notes (Signed)
Post Op Day #1  Subjective:  Donna Gordon is a 35 y.o. G1P1000 308w0d s/p pLTCS for previous myomectomy.  No acute events overnight.  Pt denies problems with ambulating, voiding or po intake.  She denies nausea or vomiting.  Pain is well controlled.  She has not had flatus. She has not had bowel movement.  Lochia Small.  Plan for birth control is likely  oral progesterone-only contraceptive.  Method of Feeding: Breast  Objective: BP (!) 112/48   Pulse (!) 53   Temp 97.9 F (36.6 C) (Oral)   Resp 18   LMP 02/14/2015 (Exact Date)   SpO2 98%   Breastfeeding? Unknown   Physical Exam:  General: alert, cooperative and no distress Lochia:normal flow Chest: CTAB Heart: RRR no m/r/g Abdomen: +BS, soft, nontender, fundus firm at umbilicus Uterine Fundus: firm DVT Evaluation: No evidence of DVT seen on physical exam. Extremities: No LE edema   Recent Labs  12/02/15 1146 12/03/15 0524  HGB 11.0* 8.4*  HCT 32.2* 24.4*    Assessment/Plan:  ASSESSMENT: Donna Gordon is a 35 y.o. G1P1000 758w0d pod #1 s/p pLTCS doing well. Post-op hemoglobin 8.4 from 11.0. Asymptomatic. Will start iron supplementation.  Plan for discharge tomorrow, Breastfeeding and Contraception OCPs?   LOS: 1 day   Jamelle HaringHillary M Fitzgerald, MD [facility], PGY-2 12/03/2015, 7:56 AM   Midwife attestation Post Partum Day 1 I have seen and examined this patient and agree with above documentation in the resident's note.   Donna Gordon is a 35 y.o. G1P1000 s/p PCS.  Pt denies problems with ambulating, voiding or po intake. Pain is well controlled. Method of Feeding: breast  PE:  BP (!) 119/59 (BP Location: Left Arm)   Pulse 61   Temp 98 F (36.7 C) (Oral)   Resp 18   LMP 02/14/2015 (Exact Date)   SpO2 98%   Breastfeeding? Unknown  Gen: well appearing Heart: reg rate Lungs: normal WOB Fundus firm Ext: soft, no pain, no edema  Plan for discharge: tomorrow  Donette LarryMelanie Aleya Durnell,  CNM 8:36 PM

## 2015-12-03 NOTE — Progress Notes (Signed)
Dr Earlene PlaterWallace notified of change of honeycomb dressing due to saturation of old dressing with old blood

## 2015-12-03 NOTE — Progress Notes (Signed)
UR chart review completed.  

## 2015-12-04 MED ORDER — SENNOSIDES-DOCUSATE SODIUM 8.6-50 MG PO TABS: 2.0000 | ORAL_TABLET | Freq: Every evening | ORAL | 0 refills | Status: DC | PRN

## 2015-12-04 MED ORDER — POLYSACCHARIDE IRON COMPLEX 150 MG PO CAPS: 150.0000 mg | ORAL_CAPSULE | Freq: Every day | ORAL | 0 refills | Status: DC

## 2015-12-04 MED ORDER — OXYCODONE-ACETAMINOPHEN 5-325 MG PO TABS: 1.0000 | ORAL_TABLET | Freq: Four times a day (QID) | ORAL | 0 refills | Status: DC | PRN

## 2015-12-04 MED ORDER — IBUPROFEN 600 MG PO TABS: 600.0000 mg | ORAL_TABLET | Freq: Four times a day (QID) | ORAL | 0 refills | Status: DC | PRN

## 2015-12-04 NOTE — Discharge Instructions (Signed)
Postpartum Care After Cesarean Delivery  The period of time right after you deliver your newborn is called the postpartum period.  What kind of medical care will I receive?  · You may continue to receive fluids and medicines through an IV tube inserted into one of your veins.  · You may have small, flexible tube (catheter) draining urine from your bladder into a bag outside of your body. The catheter will be removed as soon as possible.  · You may be given a squirt bottle to use when you go to the bathroom. You may use this until you are comfortable wiping as usual. To use the squirt bottle, follow these steps:  ? Before you urinate, fill the squirt bottle with warm water. The water should be warm. Do not use hot water.  ? After you urinate, while you are sitting on the toilet, use the squirt bottle to rinse the area around your urethra and vaginal opening. This rinses away any urine and blood.  ? You may do this instead of wiping. As you start healing, you may use the squirt bottle before wiping yourself. Make sure to wipe gently.  ? Fill the squirt bottle with clean water every time you use the bathroom.  · You will be given sanitary pads to wear.  · Your incision will be monitored to make sure it is healing properly. You will be told when it is safe for your stitches, staples, or skin adhesive tape to be removed.  What can I expect?  · You may not feel the need to urinate for several hours after delivery.  · You will have some soreness and pain in your abdomen. You may have a small amount of blood or clear fluid coming from your incision.  · If you are breastfeeding, you may have uterine contractions every time you breastfeed for up to several weeks postpartum. Uterine contractions help your uterus return to its normal size.  · It is normal to have vaginal bleeding (lochia) after delivery. The amount and appearance of lochia is often similar to a menstrual period in the first week after delivery. It will  gradually decrease over the next few weeks to a dry, yellow-brown discharge. For most women, lochia stops completely by 6-8 weeks after delivery. Vaginal bleeding can vary from woman to woman.  · Within the first few days after delivery, you may have breast engorgement. This is when your breasts feel heavy, full, and uncomfortable. Your breasts may also throb and feel hard, tightly stretched, warm, and tender. After this occurs, you may have milk leaking from your breasts. Your health care provider can help you relieve discomfort due to breast engorgement. Breast engorgement should go away within a few days.  · You may feel more sad or worried than normal due to hormonal changes after delivery. These feelings should not last more than a few days. If these feelings do not go away after several days, speak with your health care provider.  How should I care for myself?  · Tell your health care provider if you have pain or discomfort.  · Drink enough water to keep your urine clear or pale yellow.  · Wash your hands thoroughly with soap and water for at least 20 seconds after changing your sanitary pads or using the toilet, and before holding or feeding your baby.  · If you are not breastfeeding, avoid touching your breasts a lot. Doing this can make your breasts produce more milk.  · If   you become weak or lightheaded, or you feel like you might faint, ask for help before:  ? Getting out of bed.  ? Showering.  · Change your sanitary pads frequently. Watch for any changes in your flow, such as a sudden increase in volume, a change in color, or the passing of large blood clots. If you pass a blood clot from your vagina, save it to show to your health care provider. Do not flush blood clots down the toilet without having your health care provider look at them.  · Make sure that all your vaccinations are up to date. This can help protect you and your baby from getting certain diseases. You may need to have immunizations done  before you leave the hospital.  · If desired, talk with your health care provider about methods of family planning or birth control (contraception).  How can I start bonding with my baby?  Spending as much time as possible with your baby is very important. During this time, you and your baby can get to know each other and develop a bond. Having your baby stay with you in your room (rooming in) can give you time to get to know your baby. Rooming in can also help you become comfortable caring for your baby. Breastfeeding can also help you bond with your baby.  How can I plan for returning home with my baby?  · Make sure that you have a car seat installed in your vehicle.  ? Your car seat should be checked by a certified car seat installer to make sure that it is installed safely.  ? Make sure that your baby fits into the car seat safely.  · Ask your health care provider any questions you have about caring for yourself or your baby. Make sure that you are able to contact your health care provider with any questions after leaving the hospital.  This information is not intended to replace advice given to you by your health care provider. Make sure you discuss any questions you have with your health care provider.  Document Released: 09/21/2011 Document Revised: 06/01/2015 Document Reviewed: 12/01/2014  Elsevier Interactive Patient Education © 2017 [employer]

## 2015-12-04 NOTE — Discharge Summary (Signed)
OB Discharge Summary  Patient Name: Donna Gordon DOB: 28-Dec-1980 MRN: [id number]  Date of admission: 12/02/2015 Delivering MD: Donna Gordon   Date of discharge: 12/04/2015  Admitting diagnosis: cpt [id number] - Previous myomectomy Intrauterine pregnancy: 7083w0d     Secondary diagnosis:Principal Problem:   S/P primary low transverse C-section Active Problems:   Anxiety and depression   Irritable bowel syndrome   Supervision of normal pregnancy, antepartum   Pregnancy with history of uterine myomectomy  Additional problems: None     Discharge diagnosis: Term Pregnancy Delivered                                                                     Post partum procedures: None  Augmentation: None  Complications: None  Hospital course:  Sceduled C/S   35 y.o. yo G1P1000 at 6283w0d was admitted to the hospital 12/02/2015 for scheduled cesarean section with the following indication:previous myomectomy.  Membrane Rupture Time/Date: 1:27 PM ,12/02/2015   Patient delivered a Viable infant.12/02/2015  Details of operation can be found in separate operative note.  Pateint had an uncomplicated postpartum course.  She is ambulating, tolerating a regular diet, passing flatus, and urinating well. Patient is discharged home in stable condition on  12/04/15          Physical exam Vitals:   12/03/15 0539 12/03/15 1421 12/03/15 1800 12/04/15 0518  BP: (!) 112/48 118/64 (!) 119/59 122/68  Pulse: (!) 53 (!) 54 61 65  Resp: 18 20 18 18   Temp: 97.9 F (36.6 C) 97.9 F (36.6 C) 98 F (36.7 C) 98 F (36.7 C)  TempSrc: Oral Oral Oral Oral  SpO2:       General: alert, cooperative and no distress Lochia: appropriate Uterine Fundus: firm Incision: Healing well with no significant drainage, No significant erythema, Dressing is clean, dry, and intact DVT Evaluation: No evidence of DVT seen on physical exam. Negative Homan's sign. No cords or calf tenderness. No significant  calf/ankle edema. Labs: Lab Results  Component Value Date   WBC 11.5 (H) 12/03/2015   HGB 8.4 (L) 12/03/2015   HCT 24.4 (L) 12/03/2015   MCV 89.4 12/03/2015   PLT 241 12/03/2015   CMP Latest Ref Rng & Units 05/27/2015  Glucose 65 - 99 mg/dL 74  BUN 6 - 20 mg/dL 11  Creatinine 1.910.44 - 4.781.00 mg/dL 2.950.76  Sodium 621135 - 308145 mmol/L 138  Potassium 3.5 - 5.1 mmol/L 3.8  Chloride 101 - 111 mmol/L 105  CO2 22 - 32 mmol/L 24  Calcium 8.9 - 10.3 mg/dL 9.2  Total Protein 6.5 - 8.1 g/dL 6.8  Total Bilirubin 0.3 - 1.2 mg/dL 0.5  Alkaline Phos 38 - 126 U/L 56  AST 15 - 41 U/L 17  ALT 14 - 54 U/L 14    Discharge instruction: per After Visit Summary and "Baby and Me Booklet".  After Visit Meds:    Medication List    STOP taking these medications   ranitidine 75 MG tablet Commonly known as:  ZANTAC     TAKE these medications   albuterol 108 (90 Base) MCG/ACT inhaler Commonly known as:  PROVENTIL HFA;VENTOLIN HFA Inhale 1 puff into the lungs every 6 (six) hours as needed for  wheezing or shortness of breath.   ibuprofen 600 MG tablet Commonly known as:  ADVIL,MOTRIN Take 1 tablet (600 mg total) by mouth every 6 (six) hours as needed.   iron polysaccharides 150 MG capsule Commonly known as:  NIFEREX Take 1 capsule (150 mg total) by mouth daily.   oxyCODONE-acetaminophen 5-325 MG tablet Commonly known as:  PERCOCET/ROXICET Take 1-2 tablets by mouth every 6 (six) hours as needed for moderate pain or severe pain.   prenatal multivitamin Tabs tablet Take 1 tablet by mouth daily at 12 noon.   senna-docusate 8.6-50 MG tablet Commonly known as:  Senokot-S Take 2 tablets by mouth at bedtime as needed for mild constipation.   sertraline 50 MG tablet Commonly known as:  ZOLOFT 1/2 tablet qd x 7 days and then increase to 1 whole tablet qd       Diet: routine diet  Activity: Advance as tolerated. Pelvic rest for 6 weeks.   Outpatient follow up:6 weeks Follow up Appt:Future  Appointments Date Time Provider Department Center  12/24/2015 10:20 AM Donna Gordon, CNM [facility]   Follow up visit: No Follow-up on file.  Postpartum contraception: Progesterone only pills  Newborn Data: Live born female  Birth Weight: 6 lb 7.7 oz (2940 g) APGAR: 8, 9  Baby Feeding: Breast Disposition:home with mother   12/04/2015 Jamelle HaringHillary M Fitzgerald, MD  [facility], PGY-2  OB FELLOW DISCHARGE ATTESTATION  I have seen and examined this patient and agree with above documentation in the resident's note.   Ernestina PennaNicholas Schenk, MD 12:08 PM

## 2015-12-04 NOTE — Progress Notes (Signed)
Patient reports blurry vision on examination scope patch is missing and pupils are dilated. Patient reports she may have scratched her scope patch.

## 2015-12-14 ENCOUNTER — Encounter (HOSPITAL_COMMUNITY): Payer: Self-pay | Admitting: *Deleted

## 2015-12-24 ENCOUNTER — Encounter: Payer: Self-pay | Admitting: Family

## 2015-12-24 ENCOUNTER — Ambulatory Visit (INDEPENDENT_AMBULATORY_CARE_PROVIDER_SITE_OTHER): Payer: Self-pay | Admitting: Family

## 2015-12-24 VITALS — BP 121/76 | HR 70 | Wt 151.6 lb

## 2015-12-24 DIAGNOSIS — N898 Other specified noninflammatory disorders of vagina: Secondary | ICD-10-CM

## 2015-12-24 LAB — WET PREP, GENITAL
Trich, Wet Prep: NONE SEEN
Yeast Wet Prep HPF POC: NONE SEEN

## 2015-12-24 NOTE — Progress Notes (Signed)
Subjective:     Donna Gordon is a 35 y.o. female who presents for a postpartum visit. She is 3 weeks postpartum following a delivery on 12/02/2015.  I have fully reviewed the prenatal and intrapartum course. The delivery was at 37 gestational weeks. Outcome: low-transverse csection. Anesthesia: Epidual . Postpartum course has been uncomplicated . Baby's course has been uncomplicated  Baby is feeding by breast. Bleeding small amount at this time, brownish.  Also reports vaginal odor.   Bowel function is normal . Bladder function is normal . Patient is not sexually active. Contraception method is nothing at this time but desire, birth control pills. Plans to also discuss with partner using IUD again.  Pt loved it, partner does not due to concerns of consequences for her.  Postpartum depression screening: negative    The following portions of the patient's history were reviewed and updated as appropriate: allergies, current medications, past family history, past medical history, past social history, past surgical history and problem list.  Review of Systems Pertinent items are noted in HPI.   Objective:    BP 121/76   Pulse 70   Wt 151 lb 9.6 oz (68.8 kg)   BMI 25.23 kg/m         General:  alert, cooperative and appears stated age   Breasts:  inspection negative, no nipple discharge or bleeding, no masses or nodularity palpable  Lungs: clear to auscultation bilaterally  Heart:  regular rate and rhythm, S1, S2 normal, no murmur, click, rub or gallop  Abdomen: soft, non-tender; bowel sounds normal; no masses,  no organomegaly; incision site well healed with no signs of infection   Vulva:  normal  Vagina: normal vagina, no discharge, exudate, lesion, or erythema; healed well; +odor  Cervix:  no cervical motion tenderness  Corpus: normal size, contour, position, consistency, mobility, non-tender  Adnexa:  normal adnexa  Rectal Exam: Not performed.   Assessment:   Postpartum exam. Pap  smear not done at today's visit.  Vaginal odor Plan:    1. Contraception: OCP (estrogen/progesterone); encouraged IUD if method worked for her in the past 2. Wet prep 3. Follow up  as needed.   Eino FarberWalidah Kennith GainN Karim, CNM

## 2015-12-25 MED ORDER — NORETHIN ACE-ETH ESTRAD-FE 1-20 MG-MCG(24) PO TABS: 1.0000 | ORAL_TABLET | Freq: Every day | ORAL | 11 refills | Status: DC

## 2015-12-25 NOTE — Addendum Note (Signed)
Addended by: Marlis EdelsonKARIM, [name] N on: 12/25/2015 10:02 AM   Modules accepted: Orders

## 2015-12-27 ENCOUNTER — Other Ambulatory Visit: Payer: Self-pay | Admitting: Family

## 2015-12-27 DIAGNOSIS — B9689 Other specified bacterial agents as the cause of diseases classified elsewhere: Secondary | ICD-10-CM

## 2015-12-27 DIAGNOSIS — N760 Acute vaginitis: Principal | ICD-10-CM

## 2015-12-27 MED ORDER — METRONIDAZOLE 500 MG PO TABS: 500.0000 mg | ORAL_TABLET | Freq: Two times a day (BID) | ORAL | 0 refills | Status: DC

## 2015-12-27 NOTE — Progress Notes (Signed)
PT notified regarding BV and RX sent to pharmacy.

## 2016-01-18 ENCOUNTER — Telehealth: Payer: Self-pay | Admitting: *Deleted

## 2016-01-18 NOTE — Telephone Encounter (Signed)
Per lab review noted + BV , no note re; notifying patient and rx. Need to call patient and assess whether she was treated.

## 2016-01-19 NOTE — Telephone Encounter (Signed)
Per chart review, patient was notified & treated 12/17

## 2016-02-02 ENCOUNTER — Telehealth: Payer: Self-pay | Admitting: *Deleted

## 2016-02-02 NOTE — Telephone Encounter (Signed)
Pt left message stating that she has questions about her birth control. She has not returned to work and the pills are too expensive. She would like a less expensive Rx sent to her pharmacy. Also she needs a note.

## 2016-02-04 MED ORDER — NORGESTIMATE-ETH ESTRADIOL 0.25-35 MG-MCG PO TABS: 1.0000 | ORAL_TABLET | Freq: Every day | ORAL | 11 refills | Status: DC

## 2016-02-04 NOTE — Telephone Encounter (Signed)
Contacted pt and informed pt that she come to the front office at anytime to get a return to work note.  Pt informed me that the BCP that was presrcibed was too expensive and would like the suggestion of the Pharmacist Sprintec.  Received okay from Luna KitchensKathryn Kooistra, CNM to switch BCP to Sprintec per Pharmacist recommendation.

## 2016-03-22 ENCOUNTER — Telehealth (HOSPITAL_COMMUNITY): Payer: Self-pay | Admitting: Lactation Services

## 2016-03-22 NOTE — Telephone Encounter (Signed)
Baby now 414 weeks old. Mom going back to work starting next week - 2 days a week and baby will not take bottle. She has tried several different types of bottles. Has tried letting someone else give bottle but baby will not take any bottle she has tried. Advised Mom to try 1 type of nipple for few days, not to switch back and forth. Try to catch baby before real hungry to see if she will work with you better. Try to offer appetizer via breast then switch to bottle. Discussed suck training with bottle nipple/finger.  Advised Mom she could come to support group if she would like some help. Mom not able to come today. May inquired about OP appointment but decided she will call back if needed.

## 2016-06-02 ENCOUNTER — Telehealth: Payer: Self-pay | Admitting: General Practice

## 2016-06-02 NOTE — Telephone Encounter (Signed)
Patient called and left message stating she would like a callback. Patient states since she had her daughter 6 months she has been bleeding every day except for maybe 2 weeks. Patient reports bright red bleeding with cramping. Patient states she takes OCPs and is breastfeeding. Called patient and discussed coming in for a provider visit to be seen given concerns. Patient verbalized understanding and will await phone call. Patient asked about payment regarding self pay status and discussed that she ask the front office when they call with an appt. Patient verbalized understanding & had no questions.

## 2016-06-28 ENCOUNTER — Ambulatory Visit: Payer: Medicaid Other | Admitting: Obstetrics & Gynecology

## 2016-12-28 IMAGING — US US MFM FETAL NUCHAL TRANSLUCENCY
1 series · 15 of 26 positions shown · non-contrast
Comparison: none

[Series 1: us mfm fetal nuchal translucency · 15 of 26 slices shown]
[im 1/26]
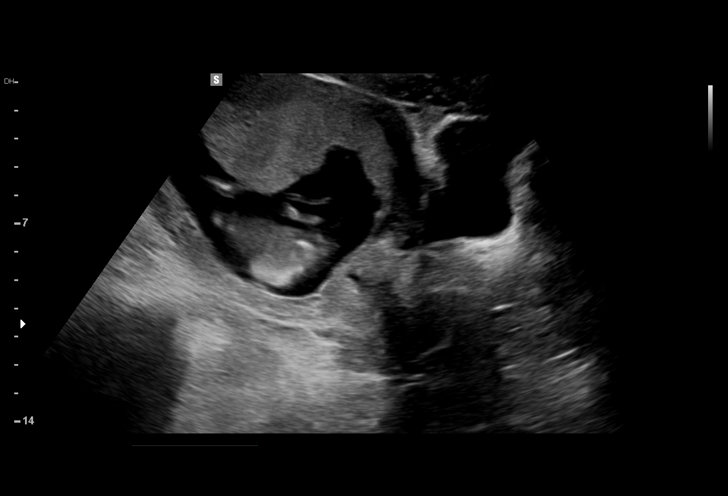
[im 3/26]
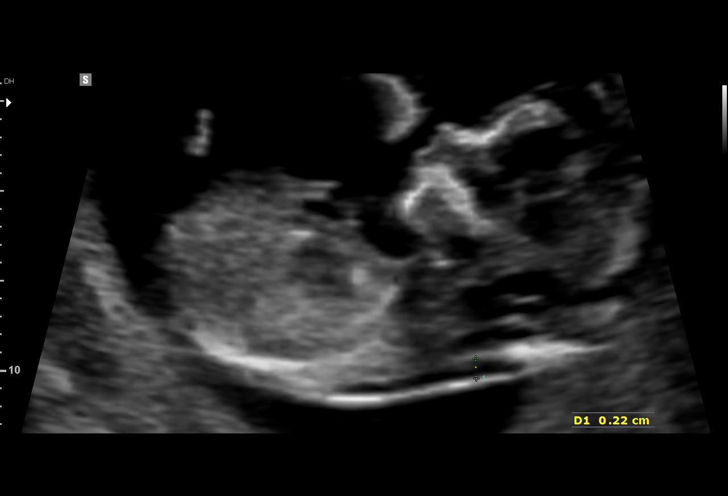
[im 5/26]
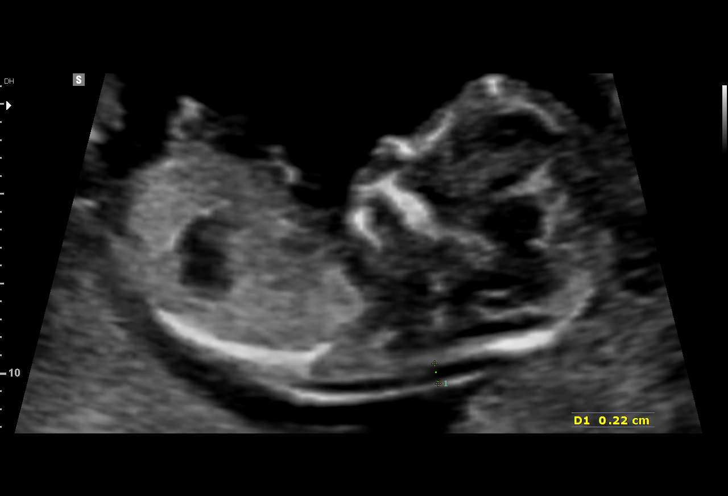
[im 7/26]
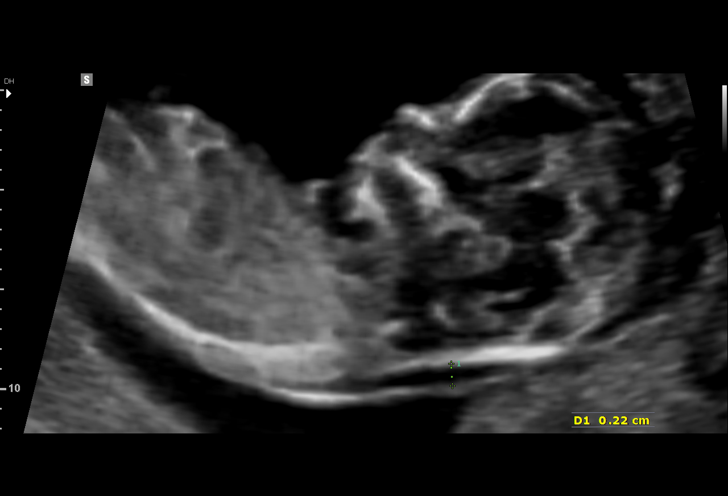
[im 8/26]
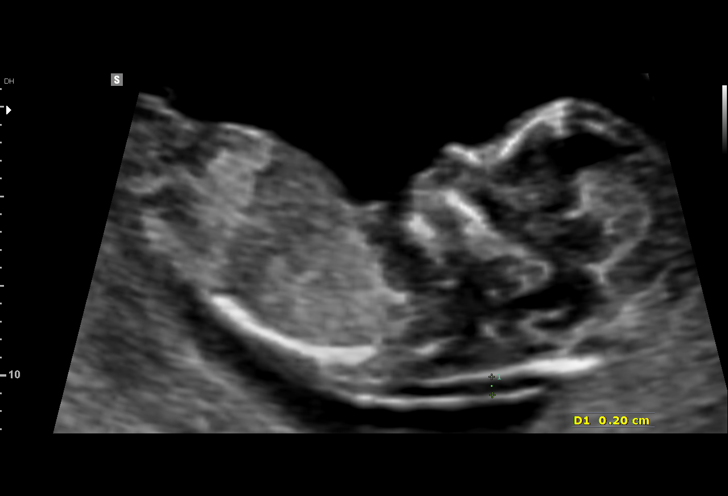
[im 10/26]
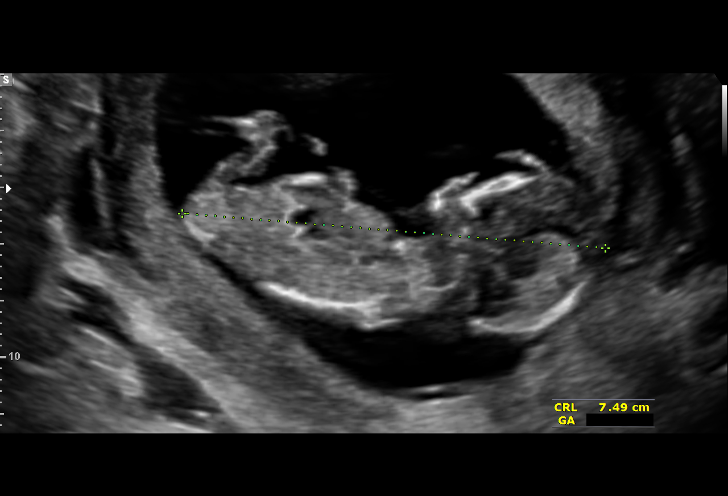
[im 12/26]
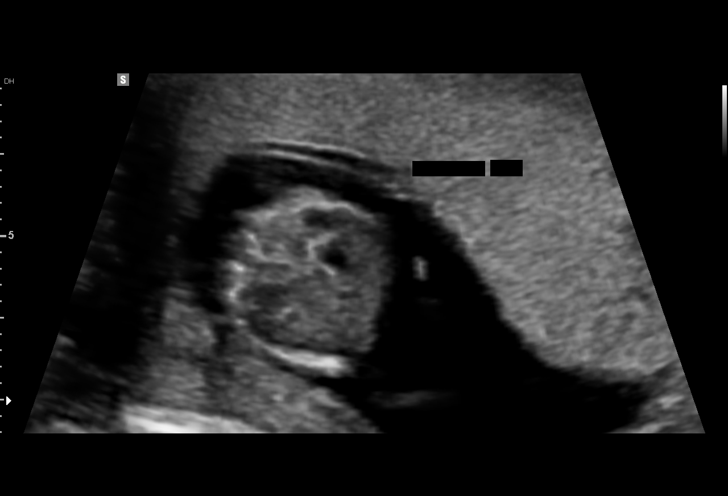
[im 14/26]
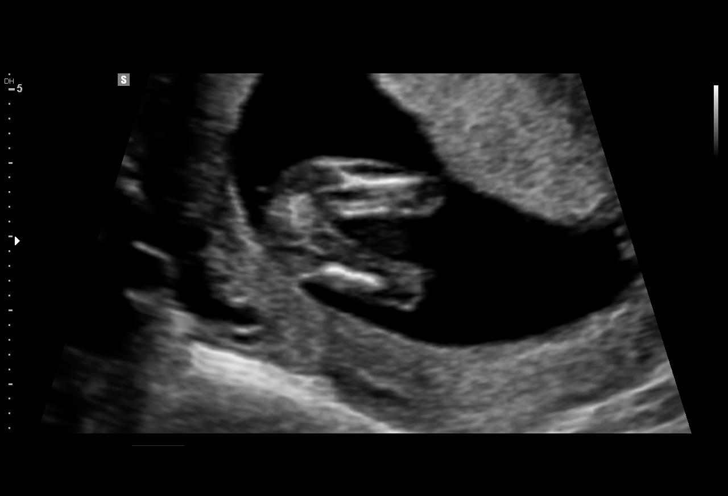
[im 15/26]
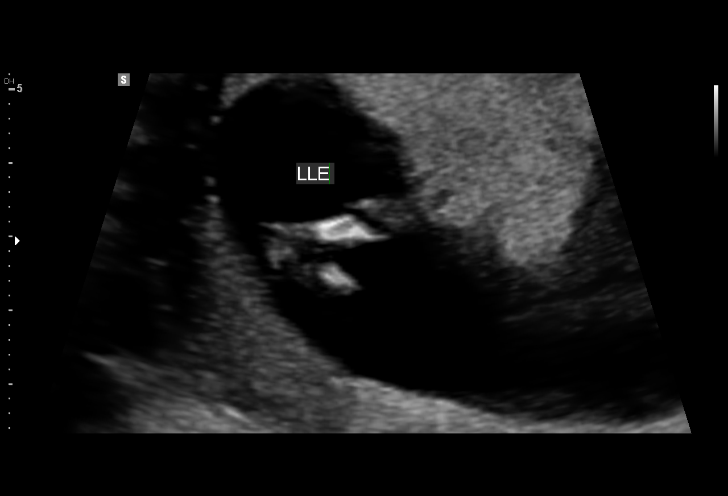
[im 17/26]
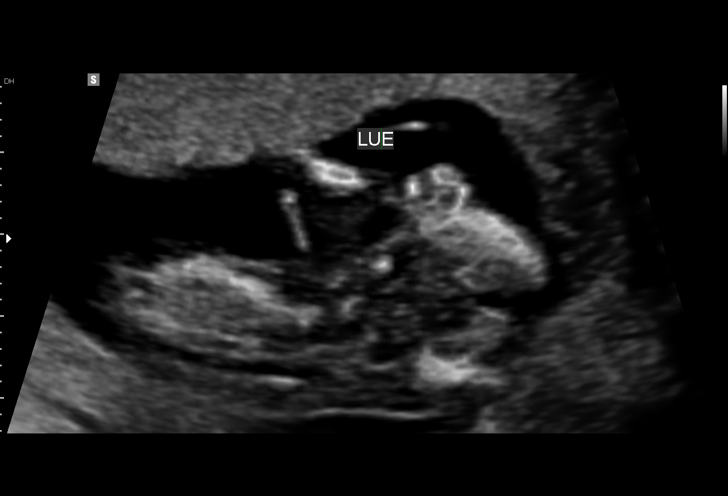
[im 19/26]
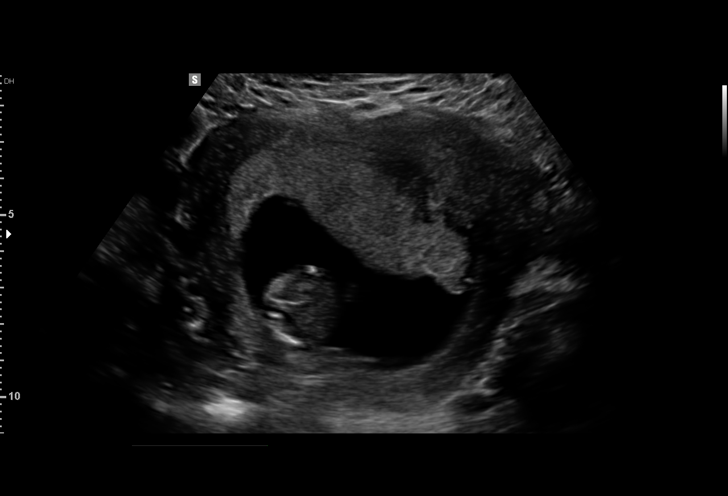
[im 20/26]
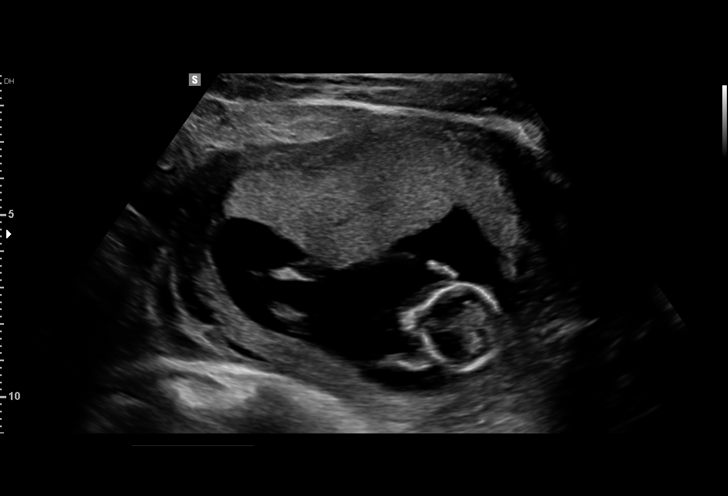
[im 22/26]
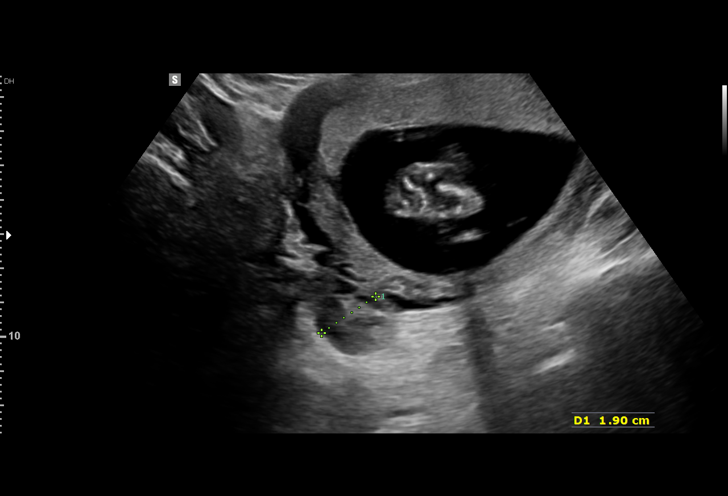
[im 24/26]
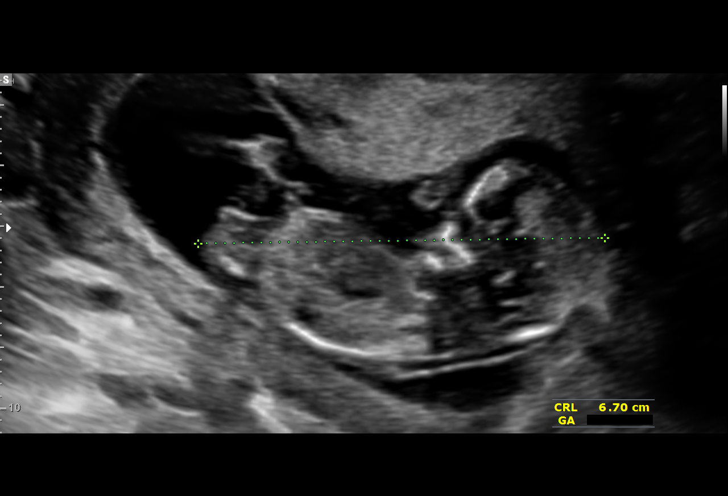
[im 26/26]
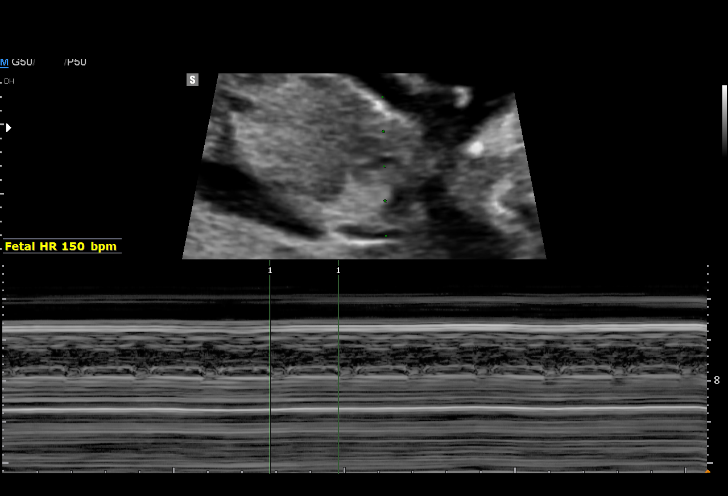

[15 of 26 positions shown; findings below may reference images not displayed]

[facility]ospital [facility]-
Faculty Physician
OB/Gyn Clinic

TRANSLUCENCY

1  BOYANG MING            [id number]      [id number]     [id number]
Indications

12 weeks gestation of pregnancy
First trimester aneuploidy screen (NT)         Z36
OB History

Gravidity:    1
Fetal Evaluation

Num Of Fetuses:     1
Preg. Location:     Intrauterine
Fetal Heart         150
Rate(bpm):
Cardiac Activity:   Observed
Presentation:       Cephalic
Placenta:           Anterior

Amniotic Fluid
AFI FV:      Subjectively within normal limits
Gestational Age

Best:          12w 6d     Det. By:  Early Ultrasound         EDD:   12/23/15
(05/12/15)
1st Trimester Genetic Sonogram Screening

CRL:            68.9  mm    G. Age:   13w 0d                 EDD:   12/22/15
Nuc Trans:       2.2  mm
Nasal Bone:                 Present
Anatomy

Cranium:               Appears normal         Bladder:                Appears normal
Choroid Plexus:        Appears normal         Upper Extremities:      Appears normal
Stomach:               Appears normal, left   Lower Extremities:      Appears normal
sided
Abdominal Wall:        Appears nml (cord
insert, abd wall)
Cervix Uterus Adnexa

Cervix
Normal appearance by transabdominal scan.

Left Ovary
Within normal limits.

Right Ovary
Within normal limits.

Adnexa:       No abnormality visualized.
Comments

The patient presented for nuchal translucency screen as part
of a first trimester screen.  Blood drawn for serum analytes
today.
Impression

Single living intrauterine pregnancy at 12 weeks 6 days.
Normal appearing nuchal translucency.
No gross fetal anomalies identified.
Recommendations

Recommend follow-up ultrasound examination in 5-6 weeks
for detailed fetal anatomic survey.

## 2017-01-27 ENCOUNTER — Telehealth: Payer: Self-pay | Admitting: *Deleted

## 2017-01-27 NOTE — Telephone Encounter (Signed)
Pt left message yesterday stating that she had her baby on November 2017. She has noticed a lump on the Rt side of abdomen above her C/S scar. It is not terribly painful however she can feel it when she coughs and it is bothersome. She wants to know if she needs to be seen. Please call back

## 2017-02-01 ENCOUNTER — Telehealth: Payer: Self-pay

## 2017-02-01 NOTE — Telephone Encounter (Signed)
Clld pt reg lump on the rt side of abdomen above her C/S scar.No answer,left message to call [phone] for appt.

## 2017-02-14 ENCOUNTER — Ambulatory Visit: Payer: Self-pay | Admitting: *Deleted

## 2017-02-14 ENCOUNTER — Encounter: Payer: Self-pay | Admitting: Obstetrics & Gynecology

## 2017-02-14 ENCOUNTER — Ambulatory Visit (INDEPENDENT_AMBULATORY_CARE_PROVIDER_SITE_OTHER): Payer: Self-pay | Admitting: Obstetrics & Gynecology

## 2017-02-14 VITALS — BP 117/62 | HR 92 | Wt 115.1 lb

## 2017-02-14 DIAGNOSIS — R1903 Right lower quadrant abdominal swelling, mass and lump: Secondary | ICD-10-CM

## 2017-02-14 DIAGNOSIS — R1031 Right lower quadrant pain: Secondary | ICD-10-CM

## 2017-02-14 DIAGNOSIS — Z98891 History of uterine scar from previous surgery: Secondary | ICD-10-CM

## 2017-02-14 NOTE — Progress Notes (Signed)
Pt agreed to CT scan however needed to leave and could not wait to receive details of CT appt.  VM message left with appt information and instructions.

## 2017-02-14 NOTE — Patient Instructions (Signed)
Hernia A hernia happens when an organ or tissue inside your body pushes out through a weak spot in the belly (abdomen). Follow these instructions at home:  Avoid stretching or overusing (straining) the muscles near the hernia.  Do not lift anything heavier than 10 lb (4.5 kg).  Use the muscles in your leg when you lift something up. Do not use the muscles in your back.  When you cough, try to cough gently.  Eat a diet that has a lot of fiber. Eat lots of fruits and vegetables.  Drink enough fluids to keep your pee (urine) clear or pale yellow. Try to drink 6-8 glasses of water a day.  Take medicines to make your poop soft (stool softeners) as told by your doctor.  Lose weight, if you are overweight.  Do not use any tobacco products, including cigarettes, chewing tobacco, or electronic cigarettes. If you need help quitting, ask your doctor.  Keep all follow-up visits as told by your doctor. This is important. Contact a doctor if:  The skin by the hernia gets puffy (swollen) or red.  The hernia is painful. Get help right away if:  You have a fever.  You have belly pain that is getting worse.  You feel sick to your stomach (nauseous) or you throw up (vomit).  You cannot push the hernia back in place by gently pressing on it while you are lying down.  The hernia: ? Changes in shape or size. ? Is stuck outside your belly. ? Changes color. ? Feels hard or tender. This information is not intended to replace advice given to you by your health care provider. Make sure you discuss any questions you have with your health care provider. Document Released: 06/16/2009 Document Revised: 06/04/2015 Document Reviewed: 11/06/2013 Elsevier Interactive Patient Education  2018 Elsevier Inc.  

## 2017-02-14 NOTE — Progress Notes (Signed)
Subjective:     Patient ID: Donna Gordon, female   DOB: [birth year], 37 y.o.   MRN: [id number] ccRLQ pain and lump [id number] Patient's last menstrual period was 02/05/2017 (exact date). S/p CS 11/2015, with sx of RLQ lump in the last month with some pain. LMP was painful Past Surgical History:  Procedure Laterality Date  . CESAREAN SECTION N/A 12/02/2015   Procedure: CESAREAN SECTION;  Surgeon: Federico FlakeKimberly Niles Newton, MD;  [facility];  Service: Obstetrics;  Laterality: N/A;  . CHOLECYSTECTOMY    . MYOMECTOMY ABDOMINAL APPROACH  08/06/2007  . TUMOR REMOVAL    . UTERINE FIBROID SURGERY     Past Medical History:  Diagnosis Date  . Asthma   . Depression   . Fibroid   . History of uterine fibroid   . Ovarian cyst 2013     Review of Systems  Constitutional: Negative.   Gastrointestinal: Positive for abdominal distention and abdominal pain. Negative for constipation, nausea and vomiting.  Genitourinary: Positive for menstrual problem (cramps) and pelvic pain.       Objective:   Physical Exam  Constitutional: She appears well-developed. No distress.  Cardiovascular: Normal rate.  Abdominal: Soft. She exhibits no mass. There is no tenderness.   Incision intact    Assessment:     RLQ pain S/p CS and previous myomectomy    Plan:     R/o incisional hernia with CT with contrast  Adam PhenixArnold, Aiden Helzer G, MD 02/14/2017

## 2017-02-14 NOTE — Progress Notes (Signed)
Pt reports having a lump on Rt side of C/S scar x1 month. It changes size intermittently from larger to smaller but never goes away completely. She states sometimes there is a slight discomfort in the area. With her LMP 02/05/17 she experienced more than normal cramping "the worst one in my life".   Dr. Debroah LoopArnold was requested to see pt for evaluation and agreed.

## 2017-02-20 ENCOUNTER — Encounter (HOSPITAL_COMMUNITY): Payer: Self-pay

## 2017-02-20 ENCOUNTER — Ambulatory Visit (HOSPITAL_COMMUNITY)
Admission: RE | Admit: 2017-02-20 | Discharge: 2017-02-20 | Disposition: A | Discharge status: Home / Self Care | Payer: Self-pay | Source: Ambulatory Visit | Attending: Obstetrics & Gynecology | Admitting: Obstetrics & Gynecology

## 2017-02-20 DIAGNOSIS — Z98891 History of uterine scar from previous surgery: Secondary | ICD-10-CM | POA: Insufficient documentation

## 2017-02-20 DIAGNOSIS — R1903 Right lower quadrant abdominal swelling, mass and lump: Secondary | ICD-10-CM | POA: Insufficient documentation

## 2017-02-20 DIAGNOSIS — R1031 Right lower quadrant pain: Secondary | ICD-10-CM | POA: Insufficient documentation

## 2017-02-20 MED ORDER — IOPAMIDOL (ISOVUE-300) INJECTION 61%
100.0000 mL | Freq: Once | INTRAVENOUS | Status: AC
  Administered 2017-02-20: 100 mL via INTRAVENOUS

## 2017-02-21 ENCOUNTER — Telehealth: Payer: Self-pay

## 2017-02-21 NOTE — Telephone Encounter (Signed)
Patient called the office requesting results of her CT scan. Please return call.

## 2017-02-23 NOTE — Telephone Encounter (Signed)
Patient called and left message on nurse line requesting results. Called patient and informed her of normal results. Patient verbalized understanding and states she wonders what is going on then. Told patient I am unsure and recommended she follow up with her PCP or GI doctor based off symptoms. Patient verbalized understanding & had no questions

## 2017-03-16 ENCOUNTER — Ambulatory Visit: Payer: Self-pay | Admitting: Obstetrics & Gynecology

## 2017-05-23 ENCOUNTER — Telehealth: Payer: Self-pay

## 2017-05-23 NOTE — Telephone Encounter (Signed)
Pt called and stated that her LMP was 05/05/17.  I am currently having pain, severe pain, more blood with clots, and lasted eight days.  Called pt and pt stated the above.  Pt also stated that she is was taking the BCP for about a year and it did not help with the bleeding.  Pt informed me that she has stopped taking the pill for about 6 months now and wants to know what to do.  I explained to the pt that I can ask the front office to give her a call to schedule an appt for evaluation due to the office being closed.   Pt stated understanding.

## 2017-05-31 ENCOUNTER — Telehealth: Payer: Self-pay

## 2017-05-31 NOTE — Telephone Encounter (Signed)
Pt called and stated that she has an appt on June 12th and has a question about her BCP.

## 2017-06-01 NOTE — Telephone Encounter (Signed)
Called patient and she states she talked to a nurse previously informing her she hasn't taken her OCPs in 6 months because they weren't helping her. Patient is requesting her husband not be informed of that whenever she comes in for an appt. Patient states he is aware she isn't taking them but isn't aware of how long she hasn't been taking them. Told patient we always call you back first without family members present and check you in by yourself while getting vital signs. Told patient she should let the nursing staff know then and they can tell the doctor before we call your husband back to the room. Patient verbalized understanding & had no other questions.

## 2017-06-21 ENCOUNTER — Ambulatory Visit: Payer: Self-pay | Admitting: Obstetrics & Gynecology

## 2017-06-21 ENCOUNTER — Encounter: Payer: Self-pay | Admitting: Obstetrics & Gynecology

## 2017-06-21 ENCOUNTER — Encounter

## 2017-06-21 ENCOUNTER — Encounter: Payer: Self-pay | Admitting: General Practice

## 2017-06-21 DIAGNOSIS — N938 Other specified abnormal uterine and vaginal bleeding: Secondary | ICD-10-CM | POA: Insufficient documentation

## 2017-06-21 NOTE — Progress Notes (Signed)
Charity application given to patient. 

## 2017-06-21 NOTE — Patient Instructions (Signed)

## 2017-06-21 NOTE — Progress Notes (Signed)
Patient ID: Blanche EastJulianne Gusman, female   DOB: 1980/05/19, 37 y.o.   MRN: [id number]  Chief Complaint  Patient presents with  . Gynecologic Exam  c/o prolonged VB and pain  HPI Blanche EastJulianne Sanz is a 37 y.o. female.  [id number] Patient's last menstrual period was 05/31/2017 (approximate). She has had frequent vaginal bleeding since late April with pelvic pain. Her menses have continued to get worse since her cesarean 18 months ago. She stopped taking OCP because her irregular bleeding and pain were not controlled. CT scan in Feb showed not incisional hernia or pelvic abnormality. HPI  Past Medical History:  Diagnosis Date  . Asthma   . Depression   . Fibroid   . History of uterine fibroid   . Ovarian cyst 2013    Past Surgical History:  Procedure Laterality Date  . CESAREAN SECTION N/A 12/02/2015   Procedure: CESAREAN SECTION;  Surgeon: Federico FlakeKimberly Niles Newton, MD;  [facility];  Service: Obstetrics;  Laterality: N/A;  . CHOLECYSTECTOMY    . MYOMECTOMY ABDOMINAL APPROACH  08/06/2007  . TUMOR REMOVAL    . UTERINE FIBROID SURGERY      Family History  Problem Relation Age of Onset  . Cancer Paternal Aunt 31       pancreatic   . Cancer Maternal Grandmother [id number]       breast  . Other Neg Hx     Social History Social History   Tobacco Use  . Smoking status: Former Smoker    Packs/day: 0.00  . Smokeless tobacco: Never Used  Substance Use Topics  . Alcohol use: No  . Drug use: No    No Known Allergies  Current Outpatient Medications  Medication Sig Dispense Refill  . albuterol (PROVENTIL HFA;VENTOLIN HFA) 108 (90 Base) MCG/ACT inhaler Inhale 1 puff into the lungs every 6 (six) hours as needed for wheezing or shortness of breath.    Marland Kitchen. ibuprofen (ADVIL,MOTRIN) 600 MG tablet Take 1 tablet (600 mg total) by mouth every 6 (six) hours as needed. 30 tablet 0  . iron polysaccharides (NIFEREX) 150 MG capsule Take 1 capsule (150 mg total) by mouth daily. (Patient not  taking: Reported on 06/21/2017) 30 capsule 0   No current facility-administered medications for this visit.     Review of Systems Review of Systems  Constitutional: Positive for fatigue.  Respiratory: Negative.   Cardiovascular: Negative.   Genitourinary: Positive for dyspareunia, menstrual problem, pelvic pain and vaginal bleeding. Negative for vaginal discharge.    Blood pressure 133/80, pulse 91, height 5\' 5"  (1.651 m), weight 121 lb (54.9 kg), last menstrual period 05/31/2017, currently breastfeeding.  Physical Exam Physical Exam  Constitutional: She appears well-developed. No distress.  Cardiovascular: Normal rate.  Pulmonary/Chest: Effort normal. No respiratory distress.  Abdominal: Soft. She exhibits no distension and no mass.  Genitourinary: Vagina normal.  Genitourinary Comments: Scant VB, uterus retroverted and mild tenderness, no masses  Vitals reviewed.   Data Reviewed CLINICAL DATA:  37 year old female with a history of diarrhea for 2 weeks and hematochezia  EXAM: CT ABDOMEN AND PELVIS WITH CONTRAST  TECHNIQUE: Multidetector CT imaging of the abdomen and pelvis was performed using the standard protocol following bolus administration of intravenous contrast.  CONTRAST:  <See Chart> ISOVUE-300 IOPAMIDOL (ISOVUE-300) INJECTION 61%  COMPARISON:  No prior abdominal CT  FINDINGS: Lower chest: No acute abnormality.  Hepatobiliary: Unremarkable appearance of the liver. Cholecystectomy. Unremarkable appearance of the intrahepatic and extrahepatic biliary system  Pancreas: Unremarkable pancreas  Spleen: Unremarkable spleen  Adrenals/Urinary Tract: Unremarkable adrenal glands.  Unremarkable appearance the bilateral kidneys with no hydronephrosis or nephrolithiasis.  Stomach/Bowel: Unremarkable appearance of the stomach. Enteric contrast present through the length of small bowel. Contrast does not yet reached the colon. Mild formed stool burden.  No inflammatory changes. Appendix is not visualized, however, no inflammatory changes are present adjacent to the cecum to indicate an appendicitis. No free air or significant free fluid.  Vascular/Lymphatic: No significant atherosclerotic changes. Unremarkable appearance of the mesenteric arteries, renal arteries, right and left iliac and proximal femoral arteries.  No adenopathy  Reproductive: Fluid within the endometrial canal. Physiologic changes of the right adnexa.  Other: Fat containing umbilical hernia  Musculoskeletal: No acute displaced fracture. No significant degenerative changes.  IMPRESSION: No acute CT finding to account for the patient's symptoms.  Physiologic changes of the endometrial canal and adnexa.   Electronically Signed   By: Gilmer Mor D.O.   On: 02/20/2017 18:55  Assessment    DUB, pelvic pain H/o myomectomy Desires future fertility    Plan    Pelvic US RTC for f/u in 4 weeks  Tylenol or ibuprofen prn pain       Scheryl Darter 06/21/2017, 2:37 PM

## 2017-06-22 LAB — CBC
Hematocrit: 40.4 % (ref 34.0–46.6)
Hemoglobin: 13.5 g/dL (ref 11.1–15.9)
MCH: 29.3 pg (ref 26.6–33.0)
MCHC: 33.4 g/dL (ref 31.5–35.7)
MCV: 88 fL (ref 79–97)
Platelets: 413 10*3/uL (ref 150–450)
RBC: 4.61 x10E6/uL (ref 3.77–5.28)
RDW: 13.4 % (ref 12.3–15.4)
WBC: 10.4 10*3/uL (ref 3.4–10.8)

## 2017-06-23 ENCOUNTER — Telehealth: Payer: Self-pay

## 2017-06-23 NOTE — Telephone Encounter (Signed)
Called Pt to advise of US appt on 06/27/17 at 9:30am,pt to arrive at 8:15 w/ full bladder. No answer, left VM.

## 2017-06-27 ENCOUNTER — Other Ambulatory Visit (HOSPITAL_COMMUNITY): Payer: Self-pay

## 2017-06-28 ENCOUNTER — Ambulatory Visit (HOSPITAL_COMMUNITY): Payer: Self-pay

## 2017-06-30 ENCOUNTER — Ambulatory Visit (HOSPITAL_COMMUNITY)
Admission: RE | Admit: 2017-06-30 | Discharge: 2017-06-30 | Disposition: A | Discharge status: Home / Self Care | Payer: Self-pay | Source: Ambulatory Visit | Attending: Obstetrics & Gynecology | Admitting: Obstetrics & Gynecology

## 2017-06-30 DIAGNOSIS — N938 Other specified abnormal uterine and vaginal bleeding: Secondary | ICD-10-CM | POA: Insufficient documentation

## 2017-07-04 ENCOUNTER — Telehealth: Payer: Self-pay | Admitting: *Deleted

## 2017-07-04 NOTE — Telephone Encounter (Signed)
[place]illa called back asking to speak to nurse. I explained Dr. Debroah LoopArnold will go in detail at her appt 07/31/17 but that she may have a polyp and may need futher testing.

## 2017-07-04 NOTE — Telephone Encounter (Signed)
Julieanne called and left a message she had an ultrasound last Friday and they told her someone would call with results Monday and no one called.   I called Julieanne back and left a message I was returning her call and since I missed her: I will send a MyChart message- she can reply if needed; or call us back.

## 2017-07-29 ENCOUNTER — Other Ambulatory Visit: Payer: Self-pay

## 2017-07-29 ENCOUNTER — Encounter (HOSPITAL_COMMUNITY): Payer: Self-pay

## 2017-07-29 ENCOUNTER — Emergency Department (HOSPITAL_COMMUNITY): Payer: Self-pay

## 2017-07-29 ENCOUNTER — Emergency Department (HOSPITAL_COMMUNITY)
Admission: EM | Admit: 2017-07-29 | Discharge: 2017-07-29 | Disposition: A | Discharge status: Home / Self Care | Payer: Self-pay | Attending: Emergency Medicine | Admitting: Emergency Medicine

## 2017-07-29 DIAGNOSIS — Y999 Unspecified external cause status: Secondary | ICD-10-CM | POA: Insufficient documentation

## 2017-07-29 DIAGNOSIS — Y92009 Unspecified place in unspecified non-institutional (private) residence as the place of occurrence of the external cause: Secondary | ICD-10-CM | POA: Insufficient documentation

## 2017-07-29 DIAGNOSIS — F17210 Nicotine dependence, cigarettes, uncomplicated: Secondary | ICD-10-CM | POA: Insufficient documentation

## 2017-07-29 DIAGNOSIS — S61214A Laceration without foreign body of right ring finger without damage to nail, initial encounter: Secondary | ICD-10-CM | POA: Insufficient documentation

## 2017-07-29 DIAGNOSIS — Y939 Activity, unspecified: Secondary | ICD-10-CM | POA: Insufficient documentation

## 2017-07-29 DIAGNOSIS — J45909 Unspecified asthma, uncomplicated: Secondary | ICD-10-CM | POA: Insufficient documentation

## 2017-07-29 DIAGNOSIS — Z79899 Other long term (current) drug therapy: Secondary | ICD-10-CM | POA: Insufficient documentation

## 2017-07-29 DIAGNOSIS — W25XXXA Contact with sharp glass, initial encounter: Secondary | ICD-10-CM | POA: Insufficient documentation

## 2017-07-29 DIAGNOSIS — Z23 Encounter for immunization: Secondary | ICD-10-CM | POA: Insufficient documentation

## 2017-07-29 MED ORDER — LIDOCAINE HCL (PF) 1 % IJ SOLN
5.0000 mL | Freq: Once | INTRAMUSCULAR | Status: AC
  Administered 2017-07-29: 5 mL
  Filled 2017-07-29: qty 30

## 2017-07-29 MED ORDER — IBUPROFEN 200 MG PO TABS
600.0000 mg | ORAL_TABLET | Freq: Once | ORAL | Status: AC
  Administered 2017-07-29: 600 mg via ORAL
  Filled 2017-07-29: qty 3

## 2017-07-29 MED ORDER — TETANUS-DIPHTH-ACELL PERTUSSIS 5-2.5-18.5 LF-MCG/0.5 IM SUSP
0.5000 mL | Freq: Once | INTRAMUSCULAR | Status: AC
  Administered 2017-07-29: 0.5 mL via INTRAMUSCULAR
  Filled 2017-07-29: qty 0.5

## 2017-07-29 NOTE — ED Provider Notes (Signed)
[facility]-EMERGENCY DEPT Provider Note   CSN: [id number] Arrival date & time: 07/29/17  1245     History   Chief Complaint Chief Complaint  Patient presents with  . Extremity Laceration    HPI Donna Gordon is a 37 y.o. female who presents to the ED with a laceration to the right ring finger. Patient reports that her daughter was about to fall down stairs and patient went ot push through glass door and the glass broke and cut her finger. Patient denies head injury or LOC or any other injuries.  HPI  Past Medical History:  Diagnosis Date  . Asthma   . Depression   . Fibroid   . History of uterine fibroid   . Ovarian cyst 2013    Patient Active Problem List   Diagnosis Date Noted  . DUB (dysfunctional uterine bleeding) 06/21/2017  . S/P primary low transverse C-section 12/02/2015  . Irritable bowel syndrome 08/05/2011  . Anxiety and depression 02/21/2011    Past Surgical History:  Procedure Laterality Date  . CESAREAN SECTION N/A 12/02/2015   Procedure: CESAREAN SECTION;  Surgeon: Federico FlakeKimberly Niles Newton, MD;  [facility];  Service: Obstetrics;  Laterality: N/A;  . CHOLECYSTECTOMY    . MYOMECTOMY ABDOMINAL APPROACH  08/06/2007  . TUMOR REMOVAL    . UTERINE FIBROID SURGERY       OB History    Gravida  1   Para  1   Term  1   Preterm  0   AB  0   Living  1     SAB  0   TAB  0   Ectopic  0   Multiple  0   Live Births  1            Home Medications    Prior to Admission medications   Medication Sig Start Date End Date Taking? Authorizing Provider  albuterol (PROVENTIL HFA;VENTOLIN HFA) 108 (90 Base) MCG/ACT inhaler Inhale 1 puff into the lungs every 6 (six) hours as needed for wheezing or shortness of breath.    [provider]  ibuprofen (ADVIL,MOTRIN) 600 MG tablet Take 1 tablet (600 mg total) by mouth every 6 (six) hours as needed. 12/04/15   Casey BurkittFitzgerald, Hillary Moen, MD  iron  polysaccharides (NIFEREX) 150 MG capsule Take 1 capsule (150 mg total) by mouth daily. Patient not taking: Reported on 06/21/2017 12/04/15   Casey BurkittFitzgerald, Hillary Moen, MD    Family History Family History  Problem Relation Age of Onset  . Cancer Paternal Aunt 31       pancreatic   . Cancer Maternal Grandmother 2062       breast  . Other Neg Hx     Social History Social History   Tobacco Use  . Smoking status: Current Every Day Smoker    Packs/day: 0.50    Types: Cigarettes  . Smokeless tobacco: Never Used  Substance Use Topics  . Alcohol use: No  . Drug use: No     Allergies   Patient has no known allergies.   Review of Systems Review of Systems  Skin: Positive for wound.  All other systems reviewed and are negative.    Physical Exam Updated Vital Signs BP 115/81 (BP Location: Left Arm)   Pulse 66   Temp 98.2 F (36.8 C) (Oral)   Resp 15   Ht 5\' 5"  (1.651 m)   Wt 52.2 kg (115 lb)   LMP 07/15/2017   SpO2 100%  BMI 19.14 kg/m   Physical Exam  Constitutional: She appears well-developed and well-nourished. No distress.  HENT:  Head: Normocephalic.  Eyes: EOM are normal.  Neck: Neck supple.  Cardiovascular: Normal rate.  Pulmonary/Chest: Effort normal.  Musculoskeletal: Normal range of motion.       Right hand: She exhibits tenderness and laceration. She exhibits normal range of motion and normal capillary refill. Normal sensation noted. Normal strength noted.       Hands: 3 cm flap laceration to the palmar aspect of the distal aspect of the right ring finger.   Neurological: She is alert.  Skin: Skin is warm and dry.  Psychiatric: She has a normal mood and affect.  Nursing note and vitals reviewed.    ED Treatments / Results  Labs (all labs ordered are listed, but only abnormal results are displayed) Labs Reviewed - No data to display  EKG None  Radiology Dg Finger Ring Right  Result Date: 07/29/2017 CLINICAL DATA:  Right ring finger  laceration EXAM: RIGHT RING FINGER 2+V COMPARISON:  None. FINDINGS: Laceration to the pad of the right ring finger. No radiopaque foreign body. Bones are normal. IMPRESSION: No radiopaque foreign body or osseous injury. Electronically Signed   By: Deatra Robinson M.D.   On: 07/29/2017 16:58    Procedures .Marland KitchenLaceration Repair Date/Time: 07/29/2017 6:06 PM Performed by: Janne Napoleon, NP Authorized by: Janne Napoleon, NP   Consent:    Consent obtained:  Verbal   Consent given by:  Patient   Risks discussed:  Infection, pain, poor cosmetic result and poor wound healing   Alternatives discussed:  No treatment Anesthesia (see MAR for exact dosages):    Anesthesia method:  Local infiltration   Local anesthetic:  Lidocaine 1% w/o epi Laceration details:    Location:  Finger   Finger location:  R ring finger   Length (cm):  3 Repair type:    Repair type:  Simple Pre-procedure details:    Preparation:  Patient was prepped and draped in usual sterile fashion Exploration:    Hemostasis achieved with:  Direct pressure   Wound exploration: entire depth of wound probed and visualized     Contaminated: no   Treatment:    Area cleansed with:  Betadine   Amount of cleaning:  Standard   Irrigation solution:  Sterile saline   Irrigation method:  Syringe Skin repair:    Repair method:  Sutures   Suture size:  5-0   Suture material:  Prolene   Suture technique:  Simple interrupted   Number of sutures:  5 Approximation:    Approximation:  Close Post-procedure details:    Dressing:  Antibiotic ointment and non-adherent dressing   Patient tolerance of procedure:  Tolerated well, no immediate complications   (including critical care time)  Medications Ordered in ED Medications  ibuprofen (ADVIL,MOTRIN) tablet 600 mg (600 mg Oral Given 07/29/17 1646)  lidocaine (PF) (XYLOCAINE) 1 % injection 5 mL (5 mLs Infiltration Given 07/29/17 1649)  Tdap (BOOSTRIX) injection 0.5 mL (0.5 mLs Intramuscular  Given 07/29/17 1646)     Initial Impression / Assessment and Plan / ED Course  I have reviewed the triage vital signs and the nursing notes. 36 y.o. female here with a laceration to the right ring finger stable for d/c without focal neuro deficits and x-ray without fracture, dislocation or foreign body. Patient to f/u in 7 days for wound check and suture removal. She will f/u sooner for any problems.   Final  Clinical Impressions(s) / ED Diagnoses   Final diagnoses:  Laceration of right ring finger without foreign body without damage to nail, initial encounter    ED Discharge Orders    None       Kerrie Buffalo [place], [place] 07/29/17 2006    Eber Hong, MD 08/01/17 [phone]

## 2017-07-29 NOTE — Discharge Instructions (Addendum)
You may follow up with your doctor, Urgent Care or here in one week for suture removal and wound check. Return sooner for any problems.

## 2017-07-29 NOTE — ED Triage Notes (Signed)
Pt's daughter was going to fall down stairs, so pt went to go push through glass door. Glass door broke on her right ring finger. Laceration noted on tip, approx 2 cm

## 2017-07-29 NOTE — ED Notes (Signed)
Patient verbalized understanding of discharge instructions, no questions. Patients wound dressed. Patient ambulated out of ED with steady gait in no distress.

## 2017-07-31 ENCOUNTER — Encounter: Payer: Self-pay | Admitting: Obstetrics & Gynecology

## 2017-07-31 ENCOUNTER — Ambulatory Visit (INDEPENDENT_AMBULATORY_CARE_PROVIDER_SITE_OTHER): Payer: Self-pay | Admitting: Obstetrics & Gynecology

## 2017-07-31 VITALS — BP 131/77 | HR 101 | Wt 121.5 lb

## 2017-07-31 DIAGNOSIS — N938 Other specified abnormal uterine and vaginal bleeding: Secondary | ICD-10-CM

## 2017-07-31 DIAGNOSIS — Z113 Encounter for screening for infections with a predominantly sexual mode of transmission: Secondary | ICD-10-CM

## 2017-07-31 DIAGNOSIS — N898 Other specified noninflammatory disorders of vagina: Secondary | ICD-10-CM

## 2017-07-31 NOTE — Progress Notes (Signed)
Patient ID: Donna Gordon, female   DOB: [birth year], 37 y.o.   MRN: [id number]  Chief Complaint  Patient presents with  . Vaginal Discharge  . Results    HPI Donna Gordon is a 37 y.o. female.  G1P1001 Patient's last menstrual period was 07/15/2017. She has irregular bleeding and cramps. US was done since last visit and the results were reviewed. She uses not birth control now but she infrequently has intercourse HPI  Past Medical History:  Diagnosis Date  . Asthma   . Depression   . Fibroid   . History of uterine fibroid   . Ovarian cyst 2013    Past Surgical History:  Procedure Laterality Date  . CESAREAN SECTION N/A 12/02/2015   Procedure: CESAREAN SECTION;  Surgeon: Federico FlakeKimberly Niles Newton, MD;  [facility];  Service: Obstetrics;  Laterality: N/A;  . CHOLECYSTECTOMY    . MYOMECTOMY ABDOMINAL APPROACH  08/06/2007  . TUMOR REMOVAL    . UTERINE FIBROID SURGERY      Family History  Problem Relation Age of Onset  . Cancer Paternal Aunt 31       pancreatic   . Cancer Maternal Grandmother [id number]       breast  . Other Neg Hx     Social History Social History   Tobacco Use  . Smoking status: Current Every Day Smoker    Packs/day: 0.50    Types: Cigarettes  . Smokeless tobacco: Never Used  Substance Use Topics  . Alcohol use: No  . Drug use: No    No Known Allergies  Current Outpatient Medications  Medication Sig Dispense Refill  . acetaminophen (TYLENOL) 500 MG tablet Take 500 mg by mouth every 6 (six) hours as needed.    Marland Kitchen. albuterol (PROVENTIL HFA;VENTOLIN HFA) 108 (90 Base) MCG/ACT inhaler Inhale 1 puff into the lungs every 6 (six) hours as needed for wheezing or shortness of breath.    Marland Kitchen. ibuprofen (ADVIL,MOTRIN) 600 MG tablet Take 1 tablet (600 mg total) by mouth every 6 (six) hours as needed. 30 tablet 0   No current facility-administered medications for this visit.     Review of Systems Review of Systems  Constitutional: Negative.    Gastrointestinal: Negative.   Genitourinary: Positive for menstrual problem and vaginal bleeding. Negative for vaginal discharge and vaginal pain.    Blood pressure 131/77, pulse (!) 101, weight 121 lb 8 oz (55.1 kg), last menstrual period 07/15/2017, currently breastfeeding.  Physical Exam Physical Exam  Constitutional: She appears well-developed. No distress.  Cardiovascular: Normal rate.  Pulmonary/Chest: Effort normal.  Abdominal: She exhibits no distension.  Skin: Skin is warm and dry.  Psychiatric: She has a normal mood and affect.    Data Reviewed  CLINICAL DATA:  Dysfunctional uterine bleeding. Pelvic pain since 04/2017.  EXAM: TRANSABDOMINAL AND TRANSVAGINAL ULTRASOUND OF PELVIS  TECHNIQUE: Both transabdominal and transvaginal ultrasound examinations of the pelvis were performed. Transabdominal technique was performed for global imaging of the pelvis including uterus, ovaries, adnexal regions, and pelvic cul-de-sac. It was necessary to proceed with endovaginal exam following the transabdominal exam to visualize the ovaries and adnexa and endometrium to better advantage.  COMPARISON:  None  FINDINGS: Uterus  Measurements: 8.7 x 5.3 x 6.3 cm. Uterus is retroverted. No uterine masses.  Endometrium  Thickness: 16 mm. Focal heterogeneous area in the central to lower endometrium measuring approximately 14 x 8 x 12 mm.  Right ovary  Measurements: 4.0 x 1.9 x 2.6 cm. Normal appearance/no adnexal mass.  Left ovary  Measurements: 2.4 x 1.4 x 2.5 cm. Normal appearance/no adnexal mass.  Other findings  No abnormal free fluid.  IMPRESSION: 1. 14 mm lesion within the endometrium suspicious for a polyp. Endometrium is borderline thickened measuring 16 mm. Consider further evaluation with sonohysterogram for confirmation prior to hysteroscopy. Endometrial sampling should also be considered if patient is at high risk for endometrial carcinoma.  (Ref: Radiological Reasoning: Algorithmic Workup of Abnormal Vaginal Bleeding with Endovaginal Sonography and Sonohysterography. AJR 2008; 161:W96-04)   Electronically Signed   By: Amie Portland M.D.   On: 06/30/2017 15:52 Assessment    DUB and dysmenorrhea with suspected endometrial polyp H/O abdominal myomectomy    Plan    Offered hysteroscopy and polypectomy, curettage.  The risks of surgery were discussed in detail with the patient including but not limited to: bleeding which may require transfusion or reoperation; infection which may require prolonged hospitalization or re-hospitalization and antibiotic therapy; injury to bowel, bladder, ureters and major vessels or other surrounding organs; need for additional procedures including laparotomy; thromboembolic phenomenon, incisional problems and other postoperative or anesthesia complications.  Patient was told that the likelihood that her condition and symptoms will be treated effectively with this surgical management was very high; the postoperative expectations were also discussed in detail. The patient also understands the alternative treatment options which were discussed in full. All questions were answered.  She was told that she will be contacted by our surgical scheduler regarding the time and date of her surgery; routine preoperative instructions of having nothing to eat or drink after midnight on the day prior to surgery and also coming to the hospital 1.5 hours prior to her time of surgery were also emphasized.  She was told she may be called for a preoperative appointment about a week prior to surgery and will be given further preoperative instructions at that visit. Printed patient education handouts about the procedure were given to the patient to review at home.          Scheryl Darter 07/31/2017, 4:54 PM

## 2017-07-31 NOTE — Patient Instructions (Signed)
Hysteroscopy  Hysteroscopy is a procedure used for looking inside the womb (uterus). It may be done for various reasons, including:  · To evaluate abnormal bleeding, fibroid (benign, noncancerous) tumors, polyps, scar tissue (adhesions), and possibly cancer of the uterus.  · To look for lumps (tumors) and other uterine growths.  · To look for causes of why a woman cannot get pregnant (infertility), causes of recurrent loss of pregnancy (miscarriages), or a lost intrauterine device (IUD).  · To perform a sterilization by blocking the fallopian tubes from inside the uterus.    In this procedure, a thin, flexible tube with a tiny light and camera on the end of it (hysteroscope) is used to look inside the uterus. A hysteroscopy should be done right after a menstrual period to be sure you are not pregnant.  LET YOUR HEALTH CARE PROVIDER KNOW ABOUT:  · Any allergies you have.  · All medicines you are taking, including vitamins, herbs, eye drops, creams, and over-the-counter medicines.  · Previous problems you or members of your family have had with the use of anesthetics.  · Any blood disorders you have.  · Previous surgeries you have had.  · Medical conditions you have.  RISKS AND COMPLICATIONS  Generally, this is a safe procedure. However, as with any procedure, complications can occur. Possible complications include:  · Putting a hole in the uterus.  · Excessive bleeding.  · Infection.  · Damage to the cervix.  · Injury to other organs.  · Allergic reaction to medicines.  · Too much fluid used in the uterus for the procedure.    BEFORE THE PROCEDURE  · Ask your health care provider about changing or stopping any regular medicines.  · Do not take aspirin or blood thinners for 1 week before the procedure, or as directed by your health care provider. These can cause bleeding.  · If you smoke, do not smoke for 2 weeks before the procedure.  · In some cases, a medicine is placed in the cervix the day before the procedure.  This medicine makes the cervix have a larger opening (dilate). This makes it easier for the instrument to be inserted into the uterus during the procedure.  · Do not eat or drink anything for at least 8 hours before the surgery.  · Arrange for someone to take you home after the procedure.  PROCEDURE  · You may be given a medicine to relax you (sedative). You may also be given one of the following:  ? A medicine that numbs the area around the cervix (local anesthetic).  ? A medicine that makes you sleep through the procedure (general anesthetic).  · The hysteroscope is inserted through the vagina into the uterus. The camera on the hysteroscope sends a picture to a TV screen. This gives the surgeon a good view inside the uterus.  · During the procedure, air or a liquid is put into the uterus, which allows the surgeon to see better.  · Sometimes, tissue is gently scraped from inside the uterus. These tissue samples are sent to a lab for testing.  What to expect after the procedure  · If you had a general anesthetic, you may be groggy for a couple hours after the procedure.  · If you had a local anesthetic, you will be able to go home as soon as you are stable and feel ready.  · You may have some cramping. This normally lasts for a couple days.  · You may   have bleeding, which varies from light spotting for a few days to menstrual-like bleeding for 3-7 days. This is normal.  · If your test results are not back during the visit, make an appointment with your health care provider to find out the results.  This information is not intended to replace advice given to you by your health care provider. Make sure you discuss any questions you have with your health care provider.  Document Released: 04/04/2000 Document Revised: 06/04/2015 Document Reviewed: 07/26/2012  Elsevier Interactive Patient Education © 2017 [employer] Inc[employer]

## 2017-08-01 LAB — CERVICOVAGINAL ANCILLARY ONLY
Bacterial vaginitis: POSITIVE — AB
Candida vaginitis: NEGATIVE
Chlamydia: NEGATIVE
NEISSERIA GONORRHEA: NEGATIVE
Trichomonas: NEGATIVE

## 2017-08-02 ENCOUNTER — Encounter (HOSPITAL_COMMUNITY): Payer: Self-pay

## 2017-08-02 ENCOUNTER — Other Ambulatory Visit: Payer: Self-pay | Admitting: Obstetrics & Gynecology

## 2017-08-02 DIAGNOSIS — B9689 Other specified bacterial agents as the cause of diseases classified elsewhere: Secondary | ICD-10-CM

## 2017-08-02 DIAGNOSIS — N760 Acute vaginitis: Principal | ICD-10-CM

## 2017-08-02 MED ORDER — METRONIDAZOLE 500 MG PO TABS
500.0000 mg | ORAL_TABLET | Freq: Two times a day (BID) | ORAL | 0 refills | Status: DC
Start: 2017-08-02 — End: 2017-09-06

## 2017-08-10 ENCOUNTER — Encounter: Payer: Self-pay | Admitting: Obstetrics & Gynecology

## 2017-08-10 DIAGNOSIS — N840 Polyp of corpus uteri: Secondary | ICD-10-CM

## 2017-08-10 HISTORY — DX: Polyp of corpus uteri: N84.0

## 2017-08-30 ENCOUNTER — Other Ambulatory Visit: Payer: Self-pay | Admitting: Obstetrics & Gynecology

## 2017-08-31 ENCOUNTER — Other Ambulatory Visit: Payer: Self-pay

## 2017-08-31 ENCOUNTER — Encounter (HOSPITAL_BASED_OUTPATIENT_CLINIC_OR_DEPARTMENT_OTHER): Payer: Self-pay | Admitting: *Deleted

## 2017-08-31 NOTE — Pre-Procedure Instructions (Signed)
To come for urine preg. test.

## 2017-09-04 NOTE — Pre-Procedure Instructions (Signed)
Received call from pt. - she is unable to come in prior to surgery for urine preg. - will do DOS.

## 2017-09-06 ENCOUNTER — Encounter (HOSPITAL_BASED_OUTPATIENT_CLINIC_OR_DEPARTMENT_OTHER)
Admission: RE | Disposition: A | Discharge status: Home / Self Care | Payer: Self-pay | Source: Ambulatory Visit | Attending: Obstetrics & Gynecology

## 2017-09-06 ENCOUNTER — Ambulatory Visit (HOSPITAL_BASED_OUTPATIENT_CLINIC_OR_DEPARTMENT_OTHER): Payer: Self-pay | Admitting: Anesthesiology

## 2017-09-06 ENCOUNTER — Other Ambulatory Visit: Payer: Self-pay

## 2017-09-06 ENCOUNTER — Encounter (HOSPITAL_BASED_OUTPATIENT_CLINIC_OR_DEPARTMENT_OTHER): Payer: Self-pay | Admitting: Anesthesiology

## 2017-09-06 ENCOUNTER — Ambulatory Visit (HOSPITAL_BASED_OUTPATIENT_CLINIC_OR_DEPARTMENT_OTHER)
Admission: RE | Admit: 2017-09-06 | Discharge: 2017-09-06 | Disposition: A | Discharge status: Home / Self Care | Payer: Self-pay | Source: Ambulatory Visit | Attending: Obstetrics & Gynecology | Admitting: Obstetrics & Gynecology

## 2017-09-06 DIAGNOSIS — F17210 Nicotine dependence, cigarettes, uncomplicated: Secondary | ICD-10-CM | POA: Insufficient documentation

## 2017-09-06 DIAGNOSIS — J45909 Unspecified asthma, uncomplicated: Secondary | ICD-10-CM | POA: Insufficient documentation

## 2017-09-06 DIAGNOSIS — N840 Polyp of corpus uteri: Secondary | ICD-10-CM | POA: Insufficient documentation

## 2017-09-06 DIAGNOSIS — K219 Gastro-esophageal reflux disease without esophagitis: Secondary | ICD-10-CM | POA: Insufficient documentation

## 2017-09-06 DIAGNOSIS — N939 Abnormal uterine and vaginal bleeding, unspecified: Secondary | ICD-10-CM | POA: Insufficient documentation

## 2017-09-06 DIAGNOSIS — N938 Other specified abnormal uterine and vaginal bleeding: Secondary | ICD-10-CM

## 2017-09-06 DIAGNOSIS — Z79899 Other long term (current) drug therapy: Secondary | ICD-10-CM | POA: Insufficient documentation

## 2017-09-06 HISTORY — PX: DILATATION & CURETTAGE/HYSTEROSCOPY WITH MYOSURE: SHX6511

## 2017-09-06 HISTORY — DX: Migraine, unspecified, not intractable, without status migrainosus: G43.909

## 2017-09-06 HISTORY — DX: Gastro-esophageal reflux disease without esophagitis: K21.9

## 2017-09-06 HISTORY — DX: Polyp of corpus uteri: N84.0

## 2017-09-06 HISTORY — DX: Family history of other specified conditions: Z84.89

## 2017-09-06 LAB — POCT HEMOGLOBIN-HEMACUE: Hemoglobin: 13.1 g/dL (ref 12.0–15.0)

## 2017-09-06 LAB — POCT PREGNANCY, URINE: Preg Test, Ur: NEGATIVE

## 2017-09-06 SURGERY — DILATATION & CURETTAGE/HYSTEROSCOPY WITH MYOSURE
Anesthesia: General

## 2017-09-06 MED ORDER — IBUPROFEN 600 MG PO TABS: 600.0000 mg | ORAL_TABLET | Freq: Four times a day (QID) | ORAL | 1 refills | Status: DC | PRN

## 2017-09-06 MED ORDER — LIDOCAINE 2% (20 MG/ML) 5 ML SYRINGE
INTRAMUSCULAR | Status: DC | PRN
  Administered 2017-09-06: 40 mg via INTRAVENOUS

## 2017-09-06 MED ORDER — LACTATED RINGERS IV SOLN
INTRAVENOUS | Status: DC | PRN
  Administered 2017-09-06 (×2): via INTRAVENOUS

## 2017-09-06 MED ORDER — DEXAMETHASONE SODIUM PHOSPHATE 4 MG/ML IJ SOLN
INTRAMUSCULAR | Status: DC | PRN
  Administered 2017-09-06: 10 mg via INTRAVENOUS

## 2017-09-06 MED ORDER — ONDANSETRON HCL 4 MG/2ML IJ SOLN
INTRAMUSCULAR | Status: DC | PRN
  Administered 2017-09-06: 4 mg via INTRAVENOUS

## 2017-09-06 MED ORDER — BUPIVACAINE HCL 0.25 % IJ SOLN
INTRAMUSCULAR | Status: DC | PRN
  Administered 2017-09-06: 7 mL

## 2017-09-06 MED ORDER — LIDOCAINE 2% (20 MG/ML) 5 ML SYRINGE
INTRAMUSCULAR | Status: AC
  Filled 2017-09-06: qty 5

## 2017-09-06 MED ORDER — SODIUM CHLORIDE 0.9 % IR SOLN
Status: DC | PRN
  Administered 2017-09-06: 3000 mL

## 2017-09-06 MED ORDER — OXYCODONE-ACETAMINOPHEN 5-325 MG PO TABS: 1.0000 | ORAL_TABLET | Freq: Four times a day (QID) | ORAL | 0 refills | Status: DC | PRN

## 2017-09-06 MED ORDER — PHENYLEPHRINE HCL 10 MG/ML IJ SOLN
INTRAMUSCULAR | Status: DC | PRN
  Administered 2017-09-06: 80 ug via INTRAVENOUS

## 2017-09-06 MED ORDER — ONDANSETRON HCL 4 MG/2ML IJ SOLN
INTRAMUSCULAR | Status: AC
  Filled 2017-09-06: qty 2

## 2017-09-06 MED ORDER — MIDAZOLAM HCL 2 MG/2ML IJ SOLN
INTRAMUSCULAR | Status: AC
  Filled 2017-09-06: qty 2

## 2017-09-06 MED ORDER — DEXAMETHASONE SODIUM PHOSPHATE 10 MG/ML IJ SOLN
INTRAMUSCULAR | Status: AC
  Filled 2017-09-06: qty 1

## 2017-09-06 MED ORDER — FENTANYL CITRATE (PF) 100 MCG/2ML IJ SOLN: 25.0000 ug | INTRAMUSCULAR | Status: DC | PRN

## 2017-09-06 MED ORDER — MIDAZOLAM HCL 5 MG/5ML IJ SOLN
INTRAMUSCULAR | Status: DC | PRN
  Administered 2017-09-06: 2 mg via INTRAVENOUS

## 2017-09-06 MED ORDER — FENTANYL CITRATE (PF) 100 MCG/2ML IJ SOLN
INTRAMUSCULAR | Status: DC | PRN
  Administered 2017-09-06: 25 ug via INTRAVENOUS
  Administered 2017-09-06: 50 ug via INTRAVENOUS
  Administered 2017-09-06: 25 ug via INTRAVENOUS

## 2017-09-06 MED ORDER — EPHEDRINE SULFATE 50 MG/ML IJ SOLN
INTRAMUSCULAR | Status: DC | PRN
  Administered 2017-09-06 (×2): 10 mg via INTRAVENOUS

## 2017-09-06 MED ORDER — FENTANYL CITRATE (PF) 100 MCG/2ML IJ SOLN
INTRAMUSCULAR | Status: AC
  Filled 2017-09-06: qty 2

## 2017-09-06 MED ORDER — EPHEDRINE 5 MG/ML INJ
INTRAVENOUS | Status: AC
  Filled 2017-09-06: qty 20

## 2017-09-06 MED ORDER — PROMETHAZINE HCL 25 MG/ML IJ SOLN: 6.2500 mg | INTRAMUSCULAR | Status: DC | PRN

## 2017-09-06 MED ORDER — PROPOFOL 10 MG/ML IV BOLUS
INTRAVENOUS | Status: DC | PRN
  Administered 2017-09-06: 150 mg via INTRAVENOUS

## 2017-09-06 MED ORDER — PHENYLEPHRINE 40 MCG/ML (10ML) SYRINGE FOR IV PUSH (FOR BLOOD PRESSURE SUPPORT)
PREFILLED_SYRINGE | INTRAVENOUS | Status: AC
  Filled 2017-09-06: qty 20

## 2017-09-06 SURGICAL SUPPLY — 18 items
BRIEF STRETCH FOR OB PAD XXL (UNDERPADS AND DIAPERS) ×3 IMPLANT
CANISTER AQUILEX FLUID KIT (CANNISTER) ×5 IMPLANT
CATH ROBINSON RED A/P 16FR (CATHETERS) ×1 IMPLANT
DEVICE MYOSURE LITE (MISCELLANEOUS) IMPLANT
DEVICE MYOSURE REACH (MISCELLANEOUS) IMPLANT
GLOVE BIO SURGEON STRL SZ 6.5 (GLOVE) ×2 IMPLANT
GLOVE BIO SURGEONS STRL SZ 6.5 (GLOVE) ×1
GLOVE BIOGEL PI IND STRL 7.0 (GLOVE) ×2 IMPLANT
GLOVE BIOGEL PI INDICATOR 7.0 (GLOVE) ×4
GOWN STRL REUS W/TWL LRG LVL3 (GOWN DISPOSABLE) ×6 IMPLANT
PACK VAGINAL MINOR WOMEN LF (CUSTOM PROCEDURE TRAY) ×3 IMPLANT
PAD OB MATERNITY 4.3X12.25 (PERSONAL CARE ITEMS) ×3 IMPLANT
PAD PREP 24X48 CUFFED NSTRL (MISCELLANEOUS) ×3 IMPLANT
SEAL ROD LENS SCOPE MYOSURE (ABLATOR) ×2 IMPLANT
SLEEVE SCD COMPRESS KNEE MED (MISCELLANEOUS) ×3 IMPLANT
TOWEL GREEN STERILE FF (TOWEL DISPOSABLE) ×4 IMPLANT
TUBING AQUILEX INFLOW (TUBING) ×2 IMPLANT
TUBING AQUILEX OUTFLOW (TUBING) ×3 IMPLANT

## 2017-09-06 NOTE — Anesthesia Procedure Notes (Signed)
Procedure Name: LMA Insertion Date/Time: 09/06/2017 3:13 PM Performed by:  DesanctisLinka, Niomie Englert L, CRNA Pre-anesthesia Checklist: Patient identified, Emergency Drugs available, Suction available, Patient being monitored and Timeout performed Patient Re-evaluated:Patient Re-evaluated prior to induction Oxygen Delivery Method: Circle system utilized Preoxygenation: Pre-oxygenation with 100% oxygen Induction Type: IV induction Ventilation: Mask ventilation without difficulty LMA: LMA inserted LMA Size: 4.0 Number of attempts: 1 Airway Equipment and Method: Bite block Placement Confirmation: positive ETCO2 Tube secured with: Tape Dental Injury: Teeth and Oropharynx as per pre-operative assessment

## 2017-09-06 NOTE — H&P (Signed)
HPI Donna EastJulianne Gordon is a 37 y.o. female.  G1P1001 Patient's last menstrual period was 07/15/2017. She has irregular bleeding and cramps. US was done since last visit and the results were reviewed. She uses no birth control now but she infrequently has intercourse HPI      Past Medical History:  Diagnosis Date  . Asthma   . Depression   . Fibroid   . History of uterine fibroid   . Ovarian cyst 2013         Past Surgical History:  Procedure Laterality Date  . CESAREAN SECTION N/A 12/02/2015   Procedure: CESAREAN SECTION;  Surgeon: Federico FlakeKimberly Niles Newton, MD;  [facility];  Service: Obstetrics;  Laterality: N/A;  . CHOLECYSTECTOMY    . MYOMECTOMY ABDOMINAL APPROACH  08/06/2007  . TUMOR REMOVAL    . UTERINE FIBROID SURGERY           Family History  Problem Relation Age of Onset  . Cancer Paternal Aunt 31       pancreatic   . Cancer Maternal Grandmother [id number]       breast  . Other Neg Hx     Social History Social History        Tobacco Use  . Smoking status: Current Every Day Smoker    Packs/day: 0.50    Types: Cigarettes  . Smokeless tobacco: Never Used  Substance Use Topics  . Alcohol use: No  . Drug use: No    No Known Allergies        Current Outpatient Medications  Medication Sig Dispense Refill  . acetaminophen (TYLENOL) 500 MG tablet Take 500 mg by mouth every 6 (six) hours as needed.    Marland Kitchen. albuterol (PROVENTIL HFA;VENTOLIN HFA) 108 (90 Base) MCG/ACT inhaler Inhale 1 puff into the lungs every 6 (six) hours as needed for wheezing or shortness of breath.    Marland Kitchen. ibuprofen (ADVIL,MOTRIN) 600 MG tablet Take 1 tablet (600 mg total) by mouth every 6 (six) hours as needed. 30 tablet 0   No current facility-administered medications for this visit.     Review of Systems Review of Systems  Constitutional: Negative.   Gastrointestinal: Negative.   Genitourinary: Positive for menstrual problem and vaginal  bleeding. Negative for vaginal discharge and vaginal pain.    Blood pressure 125/73, pulse 75, temperature 98 F (36.7 C), temperature source Oral, resp. rate 20, height 5\' 3"  (1.6 m), weight 54.4 kg, last menstrual period 08/10/2017, SpO2 100 %, not currently breastfeeding.    Physical Exam Physical Exam  Constitutional: She appears well-developed. No distress.  Cardiovascular: Normal rate.  Pulmonary/Chest: Effort normal.  Abdominal: She exhibits no distension.  Skin: Skin is warm and dry.  Psychiatric: She has a normal mood and affect.    Data Reviewed  CLINICAL DATA: Dysfunctional uterine bleeding. Pelvic pain since 04/2017.  EXAM: TRANSABDOMINAL AND TRANSVAGINAL ULTRASOUND OF PELVIS  TECHNIQUE: Both transabdominal and transvaginal ultrasound examinations of the pelvis were performed. Transabdominal technique was performed for global imaging of the pelvis including uterus, ovaries, adnexal regions, and pelvic cul-de-sac. It was necessary to proceed with endovaginal exam following the transabdominal exam to visualize the ovaries and adnexa and endometrium to better advantage.  COMPARISON: None  FINDINGS: Uterus  Measurements: 8.7 x 5.3 x 6.3 cm. Uterus is retroverted. No uterine masses.  Endometrium  Thickness: 16 mm. Focal heterogeneous area in the central to lower endometrium measuring approximately 14 x 8 x 12 mm.  Right ovary  Measurements: 4.0 x  1.9 x 2.6 cm. Normal appearance/no adnexal mass.  Left ovary  Measurements: 2.4 x 1.4 x 2.5 cm. Normal appearance/no adnexal mass.  Other findings  No abnormal free fluid.  IMPRESSION: 1. 14 mm lesion within the endometrium suspicious for a polyp. Endometrium is borderline thickened measuring 16 mm. Consider further evaluation with sonohysterogram for confirmation prior to hysteroscopy. Endometrial sampling should also be considered if patient is at high risk for endometrial  carcinoma. (Ref: Radiological Reasoning: Algorithmic Workup of Abnormal Vaginal Bleeding with Endovaginal Sonography and Sonohysterography. AJR 2008; 161:W96-04)   Electronically Signed By: Amie Portland M.D. On: 06/30/2017 15:52 Assessment    DUB and dysmenorrhea with suspected endometrial polyp H/O abdominal myomectomy  Plan    Offered hysteroscopy and polypectomy, curettage.  The risks of surgery were discussed in detail with the patient including but not limited to: bleeding which may require transfusion or reoperation; infection which may require prolonged hospitalization or re-hospitalization and antibiotic therapy; injury to bowel, bladder, ureters and major vessels or other surrounding organs; need for additional procedures including laparotomy; thromboembolic phenomenon, incisional problems and other postoperative or anesthesia complications.  Patient was told that the likelihood that her condition and symptoms will be treated effectively with this surgical management was very high; the postoperative expectations were also discussed in detail. The patient also understands the alternative treatment options which were discussed in full. All questions were answered   Adam Phenix, MD 09/06/2017

## 2017-09-06 NOTE — Anesthesia Preprocedure Evaluation (Signed)
Anesthesia Evaluation  Patient identified by MRN, date of birth, ID band Patient awake    Reviewed: Allergy & Precautions, NPO status , Patient's Chart, lab work & pertinent test results  Airway Mallampati: II  TM Distance: >3 FB Neck ROM: Full    Dental  (+) Teeth Intact, Dental Advisory Given   Pulmonary asthma , Current Smoker,    Pulmonary exam normal breath sounds clear to auscultation       Cardiovascular Exercise Tolerance: Good negative cardio ROS Normal cardiovascular exam Rhythm:Regular Rate:Normal     Neuro/Psych  Headaches, PSYCHIATRIC DISORDERS Anxiety Depression    GI/Hepatic Neg liver ROS, GERD  Medicated,  Endo/Other  negative endocrine ROS  Renal/GU negative Renal ROS     Musculoskeletal negative musculoskeletal ROS (+)   Abdominal   Peds  Hematology negative hematology ROS (+)   Anesthesia Other Findings Day of surgery medications reviewed with the patient.  Reproductive/Obstetrics                             Anesthesia Physical Anesthesia Plan  ASA: II  Anesthesia Plan: General   Post-op Pain Management:    Induction: Intravenous  PONV Risk Score and Plan: 3 and Midazolam, Ondansetron and Dexamethasone  Airway Management Planned: LMA  Additional Equipment:   Intra-op Plan:   Post-operative Plan: Extubation in OR  Informed Consent: I have reviewed the patients History and Physical, chart, labs and discussed the procedure including the risks, benefits and alternatives for the proposed anesthesia with the patient or authorized representative who has indicated his/her understanding and acceptance.   Dental advisory given  Plan Discussed with: CRNA  Anesthesia Plan Comments:         Anesthesia Quick Evaluation

## 2017-09-06 NOTE — Transfer of Care (Signed)
Immediate Anesthesia Transfer of Care Note  Patient: Youth workerJulianne Gordon  Procedure(s) Performed: DILATATION & CURETTAGE/HYSTEROSCOPY (N/A )  Patient Location: PACU  Anesthesia Type:General  Level of Consciousness: drowsy and patient cooperative  Airway & Oxygen Therapy: Patient Spontanous Breathing and Patient connected to face mask oxygen  Post-op Assessment: Report given to RN and Post -op Vital signs reviewed and stable  Post vital signs: Reviewed and stable  Last Vitals:  Vitals Value Taken Time  BP 115/59 09/06/2017  3:49 PM  Temp    Pulse 71 09/06/2017  3:51 PM  Resp 11 09/06/2017  3:51 PM  SpO2 100 % 09/06/2017  3:51 PM  Vitals shown include unvalidated device data.  Last Pain:  Vitals:   09/06/17 1308  TempSrc: Oral  PainSc: 0-No pain         Complications: No apparent anesthesia complications

## 2017-09-06 NOTE — Discharge Instructions (Signed)
Hysteroscopy, Care After °Refer to this sheet in the next few weeks. These instructions provide you with information on caring for yourself after your procedure. Your health care provider may also give you more specific instructions. Your treatment has been planned according to current medical practices, but problems sometimes occur. Call your health care provider if you have any problems or questions after your procedure. °What can I expect after the procedure? °After your procedure, it is typical to have the following: °· You may have some cramping. This normally lasts for a couple days. °· You may have bleeding. This can vary from light spotting for a few days to menstrual-like bleeding for 3-7 days. ° °Follow these instructions at home: °· Rest for the first 1-2 days after the procedure. °· Only take over-the-counter or prescription medicines as directed by your health care provider. Do not take aspirin. It can increase the chances of bleeding. °· Take showers instead of baths for 2 weeks or as directed by your health care provider. °· Do not drive for 24 hours or as directed. °· Do not drink alcohol while taking pain medicine. °· Do not use tampons, douche, or have sexual intercourse for 2 weeks or until your health care provider says it is okay. °· Take your temperature twice a day for 4-5 days. Write it down each time. °· Follow your health care provider's advice about diet, exercise, and lifting. °· If you develop constipation, you may: °? Take a mild laxative if your health care provider approves. °? Add bran foods to your diet. °? Drink enough fluids to keep your urine clear or pale yellow. °· Try to have someone with you or available to you for the first 24-48 hours, especially if you were given a general anesthetic. °· Follow up with your health care provider as directed. °Contact a health care provider if: °· You feel dizzy or lightheaded. °· You feel sick to your stomach (nauseous). °· You have  abnormal vaginal discharge. °· You have a rash. °· You have pain that is not controlled with medicine. °Get help right away if: °· You have bleeding that is heavier than a normal menstrual period. °· You have a fever. °· You have increasing cramps or pain, not controlled with medicine. °· You have new belly (abdominal) pain. °· You pass out. °· You have pain in the tops of your shoulders (shoulder strap areas). °· You have shortness of breath. °This information is not intended to replace advice given to you by your health care provider. Make sure you discuss any questions you have with your health care provider. °Document Released: 10/17/2012 Document Revised: 06/04/2015 Document Reviewed: 07/26/2012 °Elsevier Interactive Patient Education © 2017 Elsevier Inc. ° ° °Post Anesthesia Home Care Instructions ° °Activity: °Get plenty of rest for the remainder of the day. A responsible individual must stay with you for 24 hours following the procedure.  °For the next 24 hours, DO NOT: °-Drive a car °-Operate machinery °-Drink alcoholic beverages °-Take any medication unless instructed by your physician °-Make any legal decisions or sign important papers. ° °Meals: °Start with liquid foods such as gelatin or soup. Progress to regular foods as tolerated. Avoid greasy, spicy, heavy foods. If nausea and/or vomiting occur, drink only clear liquids until the nausea and/or vomiting subsides. Call your physician if vomiting continues. ° °Special Instructions/Symptoms: °Your throat may feel dry or sore from the anesthesia or the breathing tube placed in your throat during surgery. If this causes discomfort, gargle   with warm salt water. The discomfort should disappear within 24 hours. ° °If you had a scopolamine patch placed behind your ear for the management of post- operative nausea and/or vomiting: ° °1. The medication in the patch is effective for 72 hours, after which it should be removed.  Wrap patch in a tissue and discard in  the trash. Wash hands thoroughly with soap and water. °2. You may remove the patch earlier than 72 hours if you experience unpleasant side effects which may include dry mouth, dizziness or visual disturbances. °3. Avoid touching the patch. Wash your hands with soap and water after contact with the patch. °  ° ° °

## 2017-09-06 NOTE — Anesthesia Postprocedure Evaluation (Signed)
Anesthesia Post Note  Patient: Youth workerJulianne Gordon  Procedure(s) Performed: DILATATION & CURETTAGE/HYSTEROSCOPY (N/A )     Patient location during evaluation: PACU Anesthesia Type: General Level of consciousness: awake and alert, oriented and awake Pain management: pain level controlled Vital Signs Assessment: post-procedure vital signs reviewed and stable Respiratory status: spontaneous breathing, nonlabored ventilation and respiratory function stable Cardiovascular status: blood pressure returned to baseline and stable Postop Assessment: no apparent nausea or vomiting Anesthetic complications: no    Last Vitals:  Vitals:   09/06/17 1615 09/06/17 1630  BP: 116/72 118/71  Pulse: 73 65  Resp: (!) 24 20  Temp:  36.6 C  SpO2: 99% 99%    Last Pain:  Vitals:   09/06/17 1630  TempSrc:   PainSc: 1                  Cecile HearingStephen Edward Arlis Yale

## 2017-09-06 NOTE — Op Note (Signed)
PREOPERATIVE DIAGNOSIS:  Abnormal uterine bleeding, suspected endometrial polyp POSTOPERATIVE DIAGNOSIS: Normal uterine cavity PROCEDURE: Hysteroscopy, Dilation and Curettage. SURGEON:  Dr. Scheryl DarterJames Oluwaseyi Raffel   INDICATIONS: 37 y.o. G1P1001  here for scheduled surgery for the aforementioned diagnoses.   Risks of surgery were discussed with the patient including but not limited to: bleeding which may require transfusion; infection which may require antibiotics; injury to uterus or surrounding organs; intrauterine scarring which may impair future fertility; need for additional procedures including laparotomy or laparoscopy; and other postoperative/anesthesia complications. Written informed consent was obtained.    FINDINGS:  A 4-6 week size uterus.  Diffuse proliferative endometrium, no polyps or fibroids.  Normal ostia bilaterally.  ANESTHESIA:   General, paracervical block with 30 ml of 0.5% Marcaine INTRAVENOUS FLUIDS:  1000 ml of LR FLUID DEFICITS:  40ml of saline ESTIMATED BLOOD LOSS:  Less than 20 ml SPECIMENS: Endometrial curettings sent to pathology COMPLICATIONS:  None immediate.  PROCEDURE DETAILS:  The patient was then taken to the operating room where general anesthesia was administered and was found to be adequate.  After an adequate timeout was performed, she was placed in the dorsal lithotomy position and examined; then prepped and draped in the sterile manner.   Her bladder was catheterized for an unmeasured amount of clear, yellow urine. A speculum was then placed in the patient's vagina and a single tooth tenaculum was applied to the anterior lip of the cervix.   A paracervical block using 100 ml of 0.25% Marcaine was administered.  The cervix was sounded to 8 cm and dilated manually with metal dilators to accommodate the MyoSure hysteroscope.  Once the cervix was dilated, the hysteroscope was inserted under direct visualization using saline as a suspension medium.  The uterine cavity was  carefully examined with the findings as noted above.   After further careful visualization of the uterine cavity, the hysteroscope was removed under direct visualization.  A sharp curettage was then performed to obtain endometrial curettings.  The tenaculum was removed from the anterior lip of the cervix and the vaginal speculum was removed after noting good hemostasis.  The patient tolerated the procedure well and was taken to the recovery area awake, extubated and in stable condition.  The patient will be discharged to home as per PACU criteria.  Routine postoperative instructions given.  She was prescribed Percocet, Ibuprofen .  She will follow up in the clinic in 3 weeks  for postoperative evaluation.   Adam PhenixArnold, Rifky Lapre G, MD 09/06/2017 3:48 PM

## 2017-09-07 ENCOUNTER — Encounter (HOSPITAL_BASED_OUTPATIENT_CLINIC_OR_DEPARTMENT_OTHER): Payer: Self-pay | Admitting: Obstetrics & Gynecology

## 2017-09-21 ENCOUNTER — Ambulatory Visit (INDEPENDENT_AMBULATORY_CARE_PROVIDER_SITE_OTHER): Payer: Self-pay | Admitting: Obstetrics & Gynecology

## 2017-09-21 ENCOUNTER — Encounter: Payer: Self-pay | Admitting: Obstetrics & Gynecology

## 2017-09-21 VITALS — BP 120/77 | HR 89 | Wt 120.7 lb

## 2017-09-21 DIAGNOSIS — Z09 Encounter for follow-up examination after completed treatment for conditions other than malignant neoplasm: Secondary | ICD-10-CM

## 2017-09-21 DIAGNOSIS — N938 Other specified abnormal uterine and vaginal bleeding: Secondary | ICD-10-CM

## 2017-09-21 DIAGNOSIS — Z9889 Other specified postprocedural states: Secondary | ICD-10-CM

## 2017-09-21 MED ORDER — DROSPIRENONE-ETHINYL ESTRADIOL 3-0.03 MG PO TABS: 1.0000 | ORAL_TABLET | Freq: Every day | ORAL | 11 refills | Status: DC

## 2017-09-21 NOTE — Patient Instructions (Signed)

## 2017-09-21 NOTE — Progress Notes (Signed)
Subjective:     Donna Gordon is a 37 y.o. female who presents to the clinic 2 weeks status post operative hysteroscopy for abnormal uterine bleeding and polyp. Eating a regular diet without difficulty. Bowel movements are normal. The patient is not having any pain.  The following portions of the patient's history were reviewed and updated as appropriate: allergies, current medications, past family history, past medical history, past social history, past surgical history and problem list.  Review of Systems Pertinent items are noted in HPI.    Objective:    BP 120/77   Pulse 89   Wt 120 lb 11.2 oz (54.7 kg)   BMI 21.38 kg/m  General:  alert and cooperative  Abdomen: soft, bowel sounds active, non-tender        Assessment:    Doing well postoperatively. Operative findings again reviewed. Pathology report discussed.    Plan:    1. Continue any current medications. 2. Wound care discussed. 3. Activity restrictions: none 4. Anticipated return to work: now. 5. [facility] and [facility] control  Adam PhenixArnold, Azuri Bozard G, MD

## 2018-01-05 ENCOUNTER — Other Ambulatory Visit: Payer: Self-pay

## 2018-01-05 ENCOUNTER — Emergency Department (HOSPITAL_COMMUNITY)
Admission: EM | Admit: 2018-01-05 | Discharge: 2018-01-05 | Discharge status: Against Medical Advice | Payer: Self-pay | Attending: Emergency Medicine | Admitting: Emergency Medicine

## 2018-01-05 ENCOUNTER — Encounter (HOSPITAL_COMMUNITY): Payer: Self-pay | Admitting: *Deleted

## 2018-01-05 DIAGNOSIS — Z5321 Procedure and treatment not carried out due to patient leaving prior to being seen by health care provider: Secondary | ICD-10-CM | POA: Insufficient documentation

## 2018-01-05 DIAGNOSIS — M79605 Pain in left leg: Secondary | ICD-10-CM | POA: Insufficient documentation

## 2018-01-05 DIAGNOSIS — R21 Rash and other nonspecific skin eruption: Secondary | ICD-10-CM | POA: Insufficient documentation

## 2018-01-05 NOTE — ED Triage Notes (Signed)
Pt reports she is being treated for dental abscess with amoxicillin x 1 week.  She started to have rash behind her L knee and pain in her L knee last night.  She reports mild SOB and shaky.

## 2018-10-31 ENCOUNTER — Telehealth: Payer: Self-pay | Admitting: *Deleted

## 2018-10-31 NOTE — Telephone Encounter (Signed)
Ellina called and left a message she is trying to schedule an appointment because since April she is having bleeding issues, issues with period, pain, and other issues.  Wants someone to call her to tell her what she needs to do.  Per chart review has seen Dr. Roselie Awkward last in 09/2017. I asked registrar to schedule with first available appointment 11/22/18 3:15. I called Aviendha and left a message I was calling to return her call and I  Have scheduled her for first available appointment for 11/22/18 at 3:15-please call us if questions. Tyquasia Pant,RN

## 2018-11-22 ENCOUNTER — Ambulatory Visit (INDEPENDENT_AMBULATORY_CARE_PROVIDER_SITE_OTHER): Payer: Self-pay | Admitting: Obstetrics & Gynecology

## 2018-11-22 ENCOUNTER — Encounter: Payer: Self-pay | Admitting: Obstetrics & Gynecology

## 2018-11-22 ENCOUNTER — Other Ambulatory Visit: Payer: Self-pay

## 2018-11-22 VITALS — BP 138/84 | HR 93 | Temp 98.8°F | Ht 65.0 in | Wt 129.8 lb

## 2018-11-22 DIAGNOSIS — N938 Other specified abnormal uterine and vaginal bleeding: Secondary | ICD-10-CM

## 2018-11-22 DIAGNOSIS — Z124 Encounter for screening for malignant neoplasm of cervix: Secondary | ICD-10-CM

## 2018-11-22 DIAGNOSIS — N760 Acute vaginitis: Secondary | ICD-10-CM

## 2018-11-22 DIAGNOSIS — Z1151 Encounter for screening for human papillomavirus (HPV): Secondary | ICD-10-CM

## 2018-11-22 DIAGNOSIS — R102 Pelvic and perineal pain: Secondary | ICD-10-CM | POA: Insufficient documentation

## 2018-11-22 DIAGNOSIS — Z113 Encounter for screening for infections with a predominantly sexual mode of transmission: Secondary | ICD-10-CM

## 2018-11-22 DIAGNOSIS — Z01419 Encounter for gynecological examination (general) (routine) without abnormal findings: Secondary | ICD-10-CM

## 2018-11-22 DIAGNOSIS — N898 Other specified noninflammatory disorders of vagina: Secondary | ICD-10-CM

## 2018-11-22 DIAGNOSIS — B9689 Other specified bacterial agents as the cause of diseases classified elsewhere: Secondary | ICD-10-CM

## 2018-11-22 MED ORDER — DROSPIRENONE-ETHINYL ESTRADIOL 3-0.03 MG PO TABS: 1.0000 | ORAL_TABLET | Freq: Every day | ORAL | 11 refills | Status: DC

## 2018-11-22 NOTE — Progress Notes (Signed)
Patient ID: Donna Gordon, female   DOB: [birth year], 38 y.o.   MRN: [id number]  Chief Complaint  Patient presents with  . Gynecologic Exam    HPI Donna Gordon is a 38 y.o. female.  G1P1001 Patient's last menstrual period was 10/29/2018 (exact date). For the last few months she has had increasing problems with DUB, vaginal discharge and pain and dyspareunia. She took OCP for 2 cycles last Autumn but then tried to conceive. She is more concerned with cycle control now. HPI  Past Medical History:  Diagnosis Date  . Asthma    prn inhaler  . Depression   . Family history of adverse reaction to anesthesia    maternal grandmother has hx. of being hard to wake up post-op  . GERD (gastroesophageal reflux disease)   . Migraines   . Uterine polyp 08/2017    Past Surgical History:  Procedure Laterality Date  . CESAREAN SECTION N/A 12/02/2015   Procedure: CESAREAN SECTION;  Surgeon: Caren Macadam, MD;  Location: [facility];  Service: Obstetrics;  Laterality: N/A;  . CHOLECYSTECTOMY, LAPAROSCOPIC  10/07/2014  . DILATATION & CURETTAGE/HYSTEROSCOPY WITH MYOSURE N/A 09/06/2017   Procedure: DILATATION & CURETTAGE/HYSTEROSCOPY;  Surgeon: Woodroe Mode, MD;  Location: [facility];  Service: Gynecology;  Laterality: N/A;  . MYOMECTOMY ABDOMINAL APPROACH  08/06/2007    Family History  Problem Relation Age of Onset  . Cancer Paternal Aunt 31       pancreatic   . Cancer Maternal Grandmother 59       breast  . Anesthesia problems Maternal Grandmother        hard to wake up post-op    Social History Social History   Tobacco Use  . Smoking status: Current Every Day Smoker    Packs/day: 0.50    Years: 12.00    Pack years: 6.00    Types: Cigarettes  . Smokeless tobacco: Never Used  Substance Use Topics  . Alcohol use: No  . Drug use: No    Allergies  Allergen Reactions  . Other Hives    SWEET AND SOUR SAUCE    Current Outpatient Medications   Medication Sig Dispense Refill  . albuterol (PROVENTIL HFA;VENTOLIN HFA) 108 (90 Base) MCG/ACT inhaler Inhale 1 puff into the lungs every 6 (six) hours as needed for wheezing or shortness of breath.    Marland Kitchen ibuprofen (ADVIL,MOTRIN) 600 MG tablet Take 1 tablet (600 mg total) by mouth every 6 (six) hours as needed. 30 tablet 1  . drospirenone-ethinyl estradiol (YASMIN 28) 3-0.03 MG tablet Take 1 tablet by mouth daily. (Patient not taking: Reported on 11/22/2018) 1 Package 11  . oxyCODONE-acetaminophen (PERCOCET/ROXICET) 5-325 MG tablet Take 1-2 tablets by mouth every 6 (six) hours as needed. (Patient not taking: Reported on 11/22/2018) 12 tablet 0   No current facility-administered medications for this visit.     Review of Systems Review of Systems  Constitutional: Positive for fatigue.  Gastrointestinal: Positive for nausea. Negative for abdominal distention.  Genitourinary: Positive for dyspareunia, menstrual problem, pelvic pain and vaginal discharge.    Blood pressure 138/84, pulse 93, temperature 98.8 F (37.1 C), height 5\' 5"  (1.651 m), weight 129 lb 12.8 oz (58.9 kg), last menstrual period 10/29/2018.  Physical Exam Physical Exam Constitutional:      Appearance: Normal appearance.  HENT:     Head: Normocephalic.  Neck:     Musculoskeletal: Normal range of motion.  Cardiovascular:     Pulses: Normal pulses.  Pulmonary:  Effort: Pulmonary effort is normal.  Abdominal:     General: Abdomen is flat. There is no distension.     Palpations: Abdomen is soft.     Tenderness: There is abdominal tenderness (very mild lower quadrants).  Genitourinary:    General: Normal vulva.     Vagina: Vaginal discharge present.     Comments: Uterus retroverted mild CMT, no masses Neurological:     Mental Status: She is alert.   Breasts: breasts appear normal, no suspicious masses, no skin or nipple changes or axillary nodes.   Data Reviewed CLINICAL DATA:  Dysfunctional uterine bleeding.  Pelvic pain since 04/2017.  EXAM: TRANSABDOMINAL AND TRANSVAGINAL ULTRASOUND OF PELVIS  TECHNIQUE: Both transabdominal and transvaginal ultrasound examinations of the pelvis were performed. Transabdominal technique was performed for global imaging of the pelvis including uterus, ovaries, adnexal regions, and pelvic cul-de-sac. It was necessary to proceed with endovaginal exam following the transabdominal exam to visualize the ovaries and adnexa and endometrium to better advantage.  COMPARISON:  None  FINDINGS: Uterus  Measurements: 8.7 x 5.3 x 6.3 cm. Uterus is retroverted. No uterine masses.  Endometrium  Thickness: 16 mm. Focal heterogeneous area in the central to lower endometrium measuring approximately 14 x 8 x 12 mm.  Right ovary  Measurements: 4.0 x 1.9 x 2.6 cm. Normal appearance/no adnexal mass.  Left ovary  Measurements: 2.4 x 1.4 x 2.5 cm. Normal appearance/no adnexal mass.  Other findings  No abnormal free fluid.  IMPRESSION: 1. 14 mm lesion within the endometrium suspicious for a polyp. Endometrium is borderline thickened measuring 16 mm. Consider further evaluation with sonohysterogram for confirmation prior to hysteroscopy. Endometrial sampling should also be considered if patient is at high risk for endometrial carcinoma. (Ref: Radiological Reasoning: Algorithmic Workup of Abnormal Vaginal Bleeding with Endovaginal Sonography and Sonohysterography. AJR 2008; 709:G28-36)   Electronically Signed   By: Amie Portland M.D.   On: 06/30/2017 15:52   Assessment Patient Active Problem List   Diagnosis Date Noted  . Pelvic pain in female 11/22/2018  . DUB (dysfunctional uterine bleeding) 06/21/2017  . S/P primary low transverse C-section 12/02/2015  . Irritable bowel syndrome 08/05/2011  . Anxiety and depression 02/21/2011     Plan Repeat pelvic US ordered Pap sent and STD screen  OCP renewed and f/u if not  improved    Scheryl Darter 11/22/2018, 5:01 PM

## 2018-11-22 NOTE — Patient Instructions (Signed)
Abnormal Uterine Bleeding °Abnormal uterine bleeding means bleeding more than usual from your uterus. It can include: °· Bleeding between periods. °· Bleeding after sex. °· Bleeding that is heavier than normal. °· Periods that last longer than usual. °· Bleeding after you have stopped having your period (menopause). °There are many problems that may cause this. You should see a doctor for any kind of bleeding that is not normal. Treatment depends on the cause of the bleeding. °Follow these instructions at home: °· Watch your condition for any changes. °· Do not use tampons, douche, or have sex, if your doctor tells you not to. °· Change your pads often. °· Get regular well-woman exams. Make sure they include a pelvic exam and cervical cancer screening. °· Keep all follow-up visits as told by your doctor. This is important. °Contact a doctor if: °· The bleeding lasts more than one week. °· You feel dizzy at times. °· You feel like you are going to throw up (nauseous). °· You throw up. °Get help right away if: °· You pass out. °· You have to change pads every hour. °· You have belly (abdominal) pain. °· You have a fever. °· You get sweaty. °· You get weak. °· You passing large blood clots from your vagina. °Summary °· Abnormal uterine bleeding means bleeding more than usual from your uterus. °· There are many problems that may cause this. You should see a doctor for any kind of bleeding that is not normal. °· Treatment depends on the cause of the bleeding. °This information is not intended to replace advice given to you by your health care provider. Make sure you discuss any questions you have with your health care provider. °Document Released: 10/24/2008 Document Revised: 12/22/2015 Document Reviewed: 12/22/2015 °Elsevier Patient Education © 2020 Elsevier Inc. ° °

## 2018-11-26 LAB — CYTOLOGY - PAP
Chlamydia: NEGATIVE
Comment: NEGATIVE
Comment: NEGATIVE
Comment: NORMAL
Diagnosis: NEGATIVE
High risk HPV: NEGATIVE
Neisseria Gonorrhea: NEGATIVE

## 2018-12-03 LAB — CERVICOVAGINAL ANCILLARY ONLY
Bacterial Vaginitis-Urine: POSITIVE — AB
Bacterial Vaginitis-Urine: POSITIVE — AB
Candida Urine: NEGATIVE
Comment: NEGATIVE
Trichomonas: NEGATIVE

## 2018-12-04 ENCOUNTER — Other Ambulatory Visit: Payer: Self-pay

## 2018-12-04 ENCOUNTER — Ambulatory Visit (HOSPITAL_COMMUNITY)
Admission: RE | Admit: 2018-12-04 | Discharge: 2018-12-04 | Disposition: A | Discharge status: Home / Self Care | Payer: Self-pay | Source: Ambulatory Visit | Attending: Obstetrics & Gynecology | Admitting: Obstetrics & Gynecology

## 2018-12-04 DIAGNOSIS — N938 Other specified abnormal uterine and vaginal bleeding: Secondary | ICD-10-CM | POA: Insufficient documentation

## 2018-12-04 DIAGNOSIS — R102 Pelvic and perineal pain: Secondary | ICD-10-CM | POA: Insufficient documentation

## 2018-12-05 ENCOUNTER — Other Ambulatory Visit: Payer: Self-pay | Admitting: Obstetrics & Gynecology

## 2018-12-05 DIAGNOSIS — R102 Pelvic and perineal pain: Secondary | ICD-10-CM

## 2018-12-05 MED ORDER — METRONIDAZOLE 500 MG PO TABS: 500.0000 mg | ORAL_TABLET | Freq: Two times a day (BID) | ORAL | 0 refills | Status: AC

## 2018-12-05 MED ORDER — FLUCONAZOLE 150 MG PO TABS: 150.0000 mg | ORAL_TABLET | Freq: Once | ORAL | 0 refills | Status: AC

## 2018-12-05 NOTE — Progress Notes (Signed)
Flagyl and diflucan rx to pharmacy

## 2019-04-01 ENCOUNTER — Telehealth: Payer: Self-pay | Admitting: Nurse Practitioner

## 2019-04-01 DIAGNOSIS — R102 Pelvic and perineal pain: Secondary | ICD-10-CM

## 2019-04-01 NOTE — Progress Notes (Signed)
Based on what you shared with me it looks like you have vaginal pan,that should be evaluated in a face to face office visit. Due to your pregnancy you need to contact your OB/GYN about this for proper treatment. There are some urgent ares listed below that you can go to, but  Would prefer you see your OB.    NOTE: If you entered your credit card information for this eVisit, you will not be charged. You may see a "hold" on your card for the $35 but that hold will drop off and you will not have a charge processed.  If you are having a true medical emergency please call 911.     For an urgent face to face visit, [facility] has four urgent care centers for your convenience:   . [facility]    [phone]                  Get Driving Directions  [address] [place], [place] [address] . 10 am to 8 pm Monday-Friday . 12 pm to 8 pm Saturday-Sunday   . [facility] at [facility]  [phone]                  Get Driving Directions  [address] [place], [place] [address] . 8 am to 8 pm Monday-Friday . 9 am to 6 pm Saturday . 11 am to 6 pm Sunday   . [facility] at [facility]  [phone]                  Get Driving Directions   [address] [place], [place] [address] . 8 am to 8 pm Monday-Friday . 8 am to 4 pm Saturday-Sunday    . [facility] at [facility] Directions  [phone]  [address] [place], [place] [address]  . Monday-Friday, 12 PM to 6 PM    Your e-visit answers were reviewed by a board certified advanced clinical practitioner to complete your personal care plan.  Thank you for using e-Visits.

## 2019-04-10 ENCOUNTER — Encounter (HOSPITAL_COMMUNITY): Payer: Self-pay | Admitting: Emergency Medicine

## 2019-04-10 ENCOUNTER — Other Ambulatory Visit: Payer: Self-pay

## 2019-04-10 ENCOUNTER — Emergency Department (HOSPITAL_COMMUNITY)
Admission: EM | Admit: 2019-04-10 | Discharge: 2019-04-10 | Disposition: A | Discharge status: Home / Self Care | Payer: Medicaid Other | Attending: Emergency Medicine | Admitting: Emergency Medicine

## 2019-04-10 ENCOUNTER — Telehealth: Payer: Self-pay | Admitting: Family Medicine

## 2019-04-10 ENCOUNTER — Emergency Department (HOSPITAL_COMMUNITY): Payer: Medicaid Other

## 2019-04-10 DIAGNOSIS — F17210 Nicotine dependence, cigarettes, uncomplicated: Secondary | ICD-10-CM | POA: Diagnosis not present

## 2019-04-10 DIAGNOSIS — J45909 Unspecified asthma, uncomplicated: Secondary | ICD-10-CM | POA: Diagnosis not present

## 2019-04-10 DIAGNOSIS — O3680X0 Pregnancy with inconclusive fetal viability, not applicable or unspecified: Secondary | ICD-10-CM | POA: Insufficient documentation

## 2019-04-10 DIAGNOSIS — N83209 Unspecified ovarian cyst, unspecified side: Secondary | ICD-10-CM

## 2019-04-10 DIAGNOSIS — O99331 Smoking (tobacco) complicating pregnancy, first trimester: Secondary | ICD-10-CM | POA: Diagnosis not present

## 2019-04-10 DIAGNOSIS — N83202 Unspecified ovarian cyst, left side: Secondary | ICD-10-CM | POA: Insufficient documentation

## 2019-04-10 DIAGNOSIS — O99511 Diseases of the respiratory system complicating pregnancy, first trimester: Secondary | ICD-10-CM | POA: Diagnosis not present

## 2019-04-10 DIAGNOSIS — E349 Endocrine disorder, unspecified: Secondary | ICD-10-CM

## 2019-04-10 DIAGNOSIS — O209 Hemorrhage in early pregnancy, unspecified: Secondary | ICD-10-CM | POA: Diagnosis not present

## 2019-04-10 DIAGNOSIS — O0281 Inappropriate change in quantitative human chorionic gonadotropin (hCG) in early pregnancy: Secondary | ICD-10-CM | POA: Insufficient documentation

## 2019-04-10 DIAGNOSIS — R1030 Lower abdominal pain, unspecified: Secondary | ICD-10-CM

## 2019-04-10 DIAGNOSIS — O3481 Maternal care for other abnormalities of pelvic organs, first trimester: Secondary | ICD-10-CM | POA: Insufficient documentation

## 2019-04-10 DIAGNOSIS — O3480 Maternal care for other abnormalities of pelvic organs, unspecified trimester: Secondary | ICD-10-CM | POA: Diagnosis not present

## 2019-04-10 DIAGNOSIS — O26899 Other specified pregnancy related conditions, unspecified trimester: Secondary | ICD-10-CM | POA: Diagnosis not present

## 2019-04-10 DIAGNOSIS — N83201 Unspecified ovarian cyst, right side: Secondary | ICD-10-CM | POA: Diagnosis not present

## 2019-04-10 DIAGNOSIS — Z3A00 Weeks of gestation of pregnancy not specified: Secondary | ICD-10-CM | POA: Diagnosis not present

## 2019-04-10 LAB — URINALYSIS, ROUTINE W REFLEX MICROSCOPIC
Bacteria, UA: NONE SEEN
Bilirubin Urine: NEGATIVE
Glucose, UA: NEGATIVE mg/dL
Ketones, ur: NEGATIVE mg/dL
Leukocytes,Ua: NEGATIVE
Nitrite: NEGATIVE
Protein, ur: NEGATIVE mg/dL
Specific Gravity, Urine: 1.024 (ref 1.005–1.030)
pH: 5 (ref 5.0–8.0)

## 2019-04-10 LAB — ABO/RH: ABO/RH(D): O POS

## 2019-04-10 LAB — COMPREHENSIVE METABOLIC PANEL
ALT: 12 U/L (ref 0–44)
AST: 15 U/L (ref 15–41)
Albumin: 3.8 g/dL (ref 3.5–5.0)
Alkaline Phosphatase: 60 U/L (ref 38–126)
Anion gap: 9 (ref 5–15)
BUN: 14 mg/dL (ref 6–20)
CO2: 23 mmol/L (ref 22–32)
Calcium: 9.1 mg/dL (ref 8.9–10.3)
Chloride: 108 mmol/L (ref 98–111)
Creatinine, Ser: 0.66 mg/dL (ref 0.44–1.00)
GFR calc Af Amer: >60 mL/min (ref >60)
GFR calc non Af Amer: >60 mL/min (ref >60)
Glucose, Bld: 93 mg/dL (ref 70–99)
Potassium: 3.6 mmol/L (ref 3.5–5.1)
Sodium: 140 mmol/L (ref 135–145)
Total Bilirubin: 0.5 mg/dL (ref 0.3–1.2)
Total Protein: 6.7 g/dL (ref 6.5–8.1)

## 2019-04-10 LAB — HCG, QUANTITATIVE, PREGNANCY: hCG, Beta Chain, Quant, S: 178 m[IU]/mL — ABNORMAL HIGH (ref <5)

## 2019-04-10 LAB — CBC
HCT: 37.7 % (ref 36.0–46.0)
Hemoglobin: 12.4 g/dL (ref 12.0–15.0)
MCH: 30.2 pg (ref 26.0–34.0)
MCHC: 32.9 g/dL (ref 30.0–36.0)
MCV: 91.7 fL (ref 80.0–100.0)
Platelets: 319 10*3/uL (ref 150–400)
RBC: 4.11 MIL/uL (ref 3.87–5.11)
RDW: 13.3 % (ref 11.5–15.5)
WBC: 8.5 10*3/uL (ref 4.0–10.5)
nRBC: 0 % (ref 0.0–0.2)

## 2019-04-10 LAB — I-STAT BETA HCG BLOOD, ED (MC, WL, AP ONLY): I-stat hCG, quantitative: 155.5 m[IU]/mL — ABNORMAL HIGH (ref <5)

## 2019-04-10 LAB — LIPASE, BLOOD: Lipase: 30 U/L (ref 11–51)

## 2019-04-10 LAB — PREGNANCY, URINE: Preg Test, Ur: POSITIVE — AB

## 2019-04-10 MED ORDER — SODIUM CHLORIDE 0.9% FLUSH: 3.0000 mL | Freq: Once | INTRAVENOUS | Status: DC

## 2019-04-10 NOTE — ED Provider Notes (Signed)
Emergency Department Provider Note   I have reviewed the triage vital signs and the nursing notes.   HISTORY  Chief Complaint Abdominal Pain, Nausea, and Fever   HPI Donna Gordon is a 39 y.o. female with past medical history reviewed below presents to the emergency department with acute onset abdominal pain with distention starting yesterday.  Patient reports ongoing issues with DUB and is currently having vaginal bleeding now for 21 days.  She has been following with her OB/GYN who has started her on birth control medication and has had multiple pelvic US studies last year. No fever. No dysuria, hesitancy, or urgency.  She feels nauseated but is not having vomiting or diarrhea.  Her abdominal pain is in the lower abdomen and worse in the right lower quadrant specifically.  She does have history of cholecystectomy.  No change with eating or drinking.  No chest pain or shortness of breath. She is having normal BMs and passing flatus without improvement in symptoms.   Past Medical History:  Diagnosis Date  . Asthma    prn inhaler  . Depression   . Family history of adverse reaction to anesthesia    maternal grandmother has hx. of being hard to wake up post-op  . GERD (gastroesophageal reflux disease)   . Migraines   . Uterine polyp 08/2017    Patient Active Problem List   Diagnosis Date Noted  . Pelvic pain in female 11/22/2018  . DUB (dysfunctional uterine bleeding) 06/21/2017  . S/P primary low transverse C-section 12/02/2015  . Irritable bowel syndrome 08/05/2011  . Anxiety and depression 02/21/2011    Past Surgical History:  Procedure Laterality Date  . CESAREAN SECTION N/A 12/02/2015   Procedure: CESAREAN SECTION;  Surgeon: Federico Flake, MD;  Location: [facility];  Service: Obstetrics;  Laterality: N/A;  . CHOLECYSTECTOMY, LAPAROSCOPIC  10/07/2014  . DILATATION & CURETTAGE/HYSTEROSCOPY WITH MYOSURE N/A 09/06/2017   Procedure: DILATATION &  CURETTAGE/HYSTEROSCOPY;  Surgeon: Adam Phenix, MD;  Location: [facility];  Service: Gynecology;  Laterality: N/A;  . MYOMECTOMY ABDOMINAL APPROACH  08/06/2007    Allergies Other  Family History  Problem Relation Age of Onset  . Cancer Paternal Aunt 31       pancreatic   . Cancer Maternal Grandmother 3       breast  . Anesthesia problems Maternal Grandmother        hard to wake up post-op    Social History Social History   Tobacco Use  . Smoking status: Current Every Day Smoker    Packs/day: 0.50    Years: 12.00    Pack years: 6.00    Types: Cigarettes  . Smokeless tobacco: Never Used  Substance Use Topics  . Alcohol use: No  . Drug use: No    Review of Systems  Constitutional: No fever/chills Eyes: No visual changes. ENT: No sore throat. Cardiovascular: Denies chest pain. Respiratory: Denies shortness of breath. Gastrointestinal: Positive abdominal pain and distension.  No nausea, no vomiting.  No diarrhea.  No constipation. Genitourinary: Negative for dysuria. Musculoskeletal: Negative for back pain. Skin: Negative for rash. Neurological: Negative for headaches, focal weakness or numbness.  10-point ROS otherwise negative.  ____________________________________________   PHYSICAL EXAM:  VITAL SIGNS: ED Triage Vitals  Enc Vitals Group     BP 04/10/19 1641 126/82     Pulse Rate 04/10/19 1641 (!) 103     Resp 04/10/19 1641 20     Temp 04/10/19 1641 98.9 F (  37.2 C)     Temp Source 04/10/19 1641 Oral     SpO2 04/10/19 1641 99 %     Pain Score 04/10/19 1642 10   Constitutional: Alert and oriented. Well appearing and in no acute distress. Eyes: Conjunctivae are normal. Head: Atraumatic. Nose: No congestion/rhinnorhea. Mouth/Throat: Mucous membranes are moist.   Neck: No stridor. Cardiovascular: Tachycardia. Good peripheral circulation. Grossly normal heart sounds.   Respiratory: Normal respiratory effort.  No retractions. Lungs  CTAB. Gastrointestinal: Soft with moderate lower abdominal tenderness diffusely with focal tenderness in the RLQ. Notable distention.  Musculoskeletal: No gross deformities of extremities. Neurologic:  Normal speech and language.  Skin:  Skin is warm, dry and intact. No rash noted.   ____________________________________________   LABS (all labs ordered are listed, but only abnormal results are displayed)  Labs Reviewed  URINALYSIS, ROUTINE W REFLEX MICROSCOPIC - Abnormal; Notable for the following components:      Result Value   Hgb urine dipstick MODERATE (*)    All other components within normal limits  HCG, QUANTITATIVE, PREGNANCY - Abnormal; Notable for the following components:   hCG, Beta Chain, Quant, S 178 (*)    All other components within normal limits  PREGNANCY, URINE - Abnormal; Notable for the following components:   Preg Test, Ur POSITIVE (*)    All other components within normal limits  I-STAT BETA HCG BLOOD, ED ([facility], [facility], [facility] ONLY) - Abnormal; Notable for the following components:   I-stat hCG, quantitative 155.5 (*)    All other components within normal limits  LIPASE, BLOOD  COMPREHENSIVE METABOLIC PANEL  CBC  ABO/RH   ____________________________________________  RADIOLOGY  US OB Comp < 14 Wks  Result Date: 04/10/2019 CLINICAL DATA:  Initial evaluation for right lower quadrant pain with vaginal bleeding. Positive beta HCG. EXAM: OBSTETRIC <14 WK [place] AND TRANSVAGINAL OB US TECHNIQUE: Both transabdominal and transvaginal ultrasound examinations were performed for complete evaluation of the gestation as well as the maternal uterus, adnexal regions, and pelvic cul-de-sac. Transvaginal technique was performed to assess early pregnancy. COMPARISON:  Prior ultrasound from 12/04/2018. FINDINGS: Intrauterine gestational sac: Negative. Endometrial thickness measures 7 mm. Yolk sac:  Negative. Embryo:  Negative. Cardiac Activity: Negative. Heart Rate: N/A  bpm Subchorionic  hemorrhage:  None visualized. Maternal uterus/adnexae: The native right ovary is not visualized. No right adnexal mass. On the left, 2 adjacent complex cysts are seen within the left ovary, the larger of which measures up to 4.7 cm, with the second cysts measuring up to 3.5 cm. Lesions demonstrate internal hypoechoic echotexture with scattered lace-like architecture, most typical for hemorrhagic cyst. No internal vascularity or solid component. Associated small volume mildly complex free fluid within the pelvis. IMPRESSION: 1. Early pregnancy with no discrete IUP or adnexal mass identified. Finding is consistent with a pregnancy of unknown anatomic location. Differential considerations include IUP to early to visualize, recent SAB, or possibly occult ectopic pregnancy. Close clinical monitoring with serial beta HCGs and close interval follow-up ultrasound recommended as clinically warranted. 2. Two adjacent complex left ovarian cysts measuring up to 3.5 cm and 4.7 cm respectively. Appearance is most typical for hemorrhagic cysts. Associated small volume mildly complex free fluid within the pelvis could reflect recent cyst rupture. If the patient is not otherwise imaged in the interim, a short interval follow-up ultrasound in 6-12 weeks would be recommended for further evaluation. Electronically Signed   By: Jeannine Boga M.D.   On: 04/10/2019 21:35   US OB Transvaginal  Result  Date: 04/10/2019 CLINICAL DATA:  Initial evaluation for right lower quadrant pain with vaginal bleeding. Positive beta HCG. EXAM: OBSTETRIC <14 WK [place] AND TRANSVAGINAL OB US TECHNIQUE: Both transabdominal and transvaginal ultrasound examinations were performed for complete evaluation of the gestation as well as the maternal uterus, adnexal regions, and pelvic cul-de-sac. Transvaginal technique was performed to assess early pregnancy. COMPARISON:  Prior ultrasound from 12/04/2018. FINDINGS: Intrauterine gestational sac: Negative.  Endometrial thickness measures 7 mm. Yolk sac:  Negative. Embryo:  Negative. Cardiac Activity: Negative. Heart Rate: N/A  bpm Subchorionic hemorrhage:  None visualized. Maternal uterus/adnexae: The native right ovary is not visualized. No right adnexal mass. On the left, 2 adjacent complex cysts are seen within the left ovary, the larger of which measures up to 4.7 cm, with the second cysts measuring up to 3.5 cm. Lesions demonstrate internal hypoechoic echotexture with scattered lace-like architecture, most typical for hemorrhagic cyst. No internal vascularity or solid component. Associated small volume mildly complex free fluid within the pelvis. IMPRESSION: 1. Early pregnancy with no discrete IUP or adnexal mass identified. Finding is consistent with a pregnancy of unknown anatomic location. Differential considerations include IUP to early to visualize, recent SAB, or possibly occult ectopic pregnancy. Close clinical monitoring with serial beta HCGs and close interval follow-up ultrasound recommended as clinically warranted. 2. Two adjacent complex left ovarian cysts measuring up to 3.5 cm and 4.7 cm respectively. Appearance is most typical for hemorrhagic cysts. Associated small volume mildly complex free fluid within the pelvis could reflect recent cyst rupture. If the patient is not otherwise imaged in the interim, a short interval follow-up ultrasound in 6-12 weeks would be recommended for further evaluation. Electronically Signed   By: Rise Mu M.D.   On: 04/10/2019 21:35    ____________________________________________   PROCEDURES  Procedure(s) performed:   Procedures  None ____________________________________________   INITIAL IMPRESSION / ASSESSMENT AND PLAN / ED COURSE  Pertinent labs & imaging results that were available during my care of the patient were reviewed by me and considered in my medical decision making (see chart for details).   Patient presents to emergency  department for evaluation of abdominal pain worse in the right lower quadrant with focal tenderness and distention.  I reviewed her most recent pelvic ultrasound which was normal.  She continues to have DUB and is followed closely by her OB/GYN and not increasing in volume.  No concern on recent ultrasounds for uterine or ovarian malignancy.  Low suspicion for that clinically as symptoms were fairly sudden onset.   07:14 PM  Quant hCG coming back just slightly elevated. Doubt true pregnancy with >20 days of vaginal bleeding. Will d/c CT for now and move ahead with pelvic US and reassess. Moderate blood on UA 2/2 vaginal bleeding.   09:55 PM  Patient's ultrasound results reviewed and discussed with patient.  Discussed need for repeat beta-hCG.  She plans to call her OB/GYN office first thing tomorrow to schedule a repeat set of labs this week.  Suspect that her pain is coming from a ruptured ovarian cyst which fits clinically.  She would like to take Tylenol for pain and avoid other stronger pain medications which were offered.  Discussed ED return precautions in detail.  ____________________________________________  FINAL CLINICAL IMPRESSION(S) / ED DIAGNOSES  Final diagnoses:  Lower abdominal pain  Hemorrhagic cyst of ovary  Elevated serum hCG    MEDICATIONS GIVEN DURING THIS VISIT:  Medications  sodium chloride flush (NS) 0.9 % injection 3 mL (3  mLs Intravenous Not Given 04/10/19 1651)    Note:  This document was prepared using Dragon voice recognition software and may include unintentional dictation errors.  Alona Bene, MD, [facility] Emergency Medicine    Roylene Heaton, Arlyss Repress, MD 04/10/19 2206

## 2019-04-10 NOTE — Telephone Encounter (Signed)
Attempted to contact patient around 1:20 about coming into the office today to have an appointment due to her concerns that she needed addressed. No answer, left a voicemail for the patient to give the office a call back so she could be seen today.

## 2019-04-10 NOTE — ED Notes (Signed)
Patient ambulated to the bathroom independently at this time to give a urine specimen

## 2019-04-10 NOTE — ED Triage Notes (Signed)
Patient here from home with complaints of abd pain with swelling and nausea that started 2 days ago. Patient states that she is not pregnant.

## 2019-04-10 NOTE — ED Notes (Signed)
Contacted lab about add on labs.

## 2019-04-10 NOTE — Discharge Instructions (Signed)
You were seen in the emergency room today with lower abdominal pain.  I suspect your pain and bloating are coming from a ruptured hemorrhagic cyst on your ovary.  Your pregnancy hormone levels were very slightly elevated.  This can be the case in several possibilities and needs to be discussed with your OB/GYN.  Please call their office first thing tomorrow morning to schedule repeat pregnancy testing in the next 2 days.  If your symptoms worsen such as worsening pain, vomiting, bleeding, you should return to the emergency department immediately.

## 2019-04-15 ENCOUNTER — Other Ambulatory Visit: Payer: Self-pay

## 2019-04-15 ENCOUNTER — Telehealth: Payer: Self-pay

## 2019-04-15 ENCOUNTER — Other Ambulatory Visit: Payer: Medicaid Other

## 2019-04-15 DIAGNOSIS — O3680X0 Pregnancy with inconclusive fetal viability, not applicable or unspecified: Secondary | ICD-10-CM

## 2019-04-15 NOTE — Addendum Note (Signed)
Addended by: Faythe Casa on: 04/15/2019 03:43 PM   Modules accepted: Orders

## 2019-04-15 NOTE — Telephone Encounter (Signed)
Consulted with Dr. Alysia Penna about pt's concern that she sent via MyChart.  Per Dr. Alysia Penna request pt to come in today 04/15/19 for non stat beta and then her results will be reviewed at her appt scheduled on 04/17/19.  Called pt and pt informed me that she is so concerned that the doctor in the ED told her that she may not be pregnant because it could be the ovarian cyst.  I explained to the pt that according to her beta level she is pregnant somewhere however the [place] that she had shows that she is too early and stated that it is pregnancy of unknown location.  Pt denies any pain or vaginal bleeding.  Pt states that she would be able to come today before 4pm for beta level and then she will get results at her appt on 04/17/19.  Front office notified to place pt on schedule.  Lab also notified.   Addison Naegeli, RN 04/15/19

## 2019-04-16 ENCOUNTER — Telehealth: Payer: Self-pay | Admitting: Family Medicine

## 2019-04-16 LAB — BETA HCG QUANT (REF LAB): hCG Quant: 15 m[IU]/mL

## 2019-04-16 NOTE — Telephone Encounter (Signed)
Patient called in stating that she received some test results in her mychart and she does not understand what they are/mean and would like a nurse to give her a call back to better explain it. Patient instructed that a message would be sent to the nurses and they will call her back as soon as they can. Patient verbalized understanding and message sent to clinical pool.

## 2019-04-16 NOTE — Telephone Encounter (Signed)
I called Donna Gordon and discussed with her that the ED was concerned she is having a miscarriage or ectopic pregnancy and also has ovarian cysts.  We discussed her bhcg did drop yesterday to 15.  I explained I can not answer if her ovarian cysts caused her to have elevated bhcg but she also have + UPT. She states she did have vaginal bleeding ; but has stopped. She denies plan. I explained she will have her in person visit tomorrow with a doctor and he will review all her results and explain them and the plan of care. She voices understanding.  Takeo Harts,RN

## 2019-04-17 ENCOUNTER — Ambulatory Visit (INDEPENDENT_AMBULATORY_CARE_PROVIDER_SITE_OTHER): Payer: Medicaid Other | Admitting: Obstetrics & Gynecology

## 2019-04-17 ENCOUNTER — Encounter: Payer: Self-pay | Admitting: Obstetrics & Gynecology

## 2019-04-17 ENCOUNTER — Other Ambulatory Visit: Payer: Self-pay

## 2019-04-17 VITALS — BP 146/86 | HR 95 | Wt 133.4 lb

## 2019-04-17 DIAGNOSIS — O3680X0 Pregnancy with inconclusive fetal viability, not applicable or unspecified: Secondary | ICD-10-CM | POA: Diagnosis not present

## 2019-04-17 NOTE — Progress Notes (Signed)
Cramping about 430 this morning

## 2019-04-18 LAB — BETA HCG QUANT (REF LAB): hCG Quant: 7 m[IU]/mL

## 2019-04-18 NOTE — Progress Notes (Signed)
GYNECOLOGY  VISIT ENCOUNTER NOTE  Provider location: [facility] [place]   I connected with Donna Gordon on 04/18/19 at  3:35 PM EDT by MyChart Video Encounter at home and verified that I am speaking with the correct person using two identifiers.     History:  Donna Gordon is a 39 y.o. [id number] female being evaluated today for irregular bleeding. She denies any abnormal vaginal discharge, bleeding, pelvic pain or other concerns.  She was recently diagnosed with pregnancy, started cramping and having irregular bleeding quant hcg decreased from 151 -> 15.       Past Medical History:  Diagnosis Date  . Asthma    prn inhaler  . Depression   . Family history of adverse reaction to anesthesia    maternal grandmother has hx. of being hard to wake up post-op  . GERD (gastroesophageal reflux disease)   . Migraines   . Uterine polyp 08/2017   Past Surgical History:  Procedure Laterality Date  . CESAREAN SECTION N/A 12/02/2015   Procedure: CESAREAN SECTION;  Surgeon: Federico Flake, MD;  Location: [facility] BIRTHING SUITES;  Service: Obstetrics;  Laterality: N/A;  . CHOLECYSTECTOMY, LAPAROSCOPIC  10/07/2014  . DILATATION & CURETTAGE/HYSTEROSCOPY WITH MYOSURE N/A 09/06/2017   Procedure: DILATATION & CURETTAGE/HYSTEROSCOPY;  Surgeon: Adam Phenix, MD;  Location: [facility];  Service: Gynecology;  Laterality: N/A;  . MYOMECTOMY ABDOMINAL APPROACH  08/06/2007   The following portions of the patient's history were reviewed and updated as appropriate: allergies, current medications, past family history, past medical history, past social history, past surgical history and problem list.    Review of Systems:  Pertinent items noted in HPI and remainder of comprehensive ROS otherwise negative.  Physical Exam:   General:  Alert, oriented and cooperative. Patient appears to be in no acute distress.  Mental Status: Normal mood and affect. Normal  behavior. Normal judgment and thought content.   Respiratory: Normal respiratory effort, no problems with respiration noted   Labs and Imaging Results for orders placed or performed in visit on 04/17/19 (from the past 336 hour(s))  Beta hCG quant (ref lab)   Collection Time: 04/17/19  4:02 PM  Result Value Ref Range   hCG Quant 7 mIU/mL  Results for orders placed or performed in visit on 04/15/19 (from the past 336 hour(s))  Beta hCG quant (ref lab)   Collection Time: 04/15/19  4:04 PM  Result Value Ref Range   hCG Quant 15 mIU/mL  Results for orders placed or performed during the hospital encounter of 04/10/19 (from the past 336 hour(s))  Urinalysis, Routine w reflex microscopic   Collection Time: 04/10/19  5:01 PM  Result Value Ref Range   Color, Urine YELLOW YELLOW   APPearance CLEAR CLEAR   Specific Gravity, Urine 1.024 1.005 - 1.030   pH 5.0 5.0 - 8.0   Glucose, UA NEGATIVE NEGATIVE mg/dL   Hgb urine dipstick MODERATE (A) NEGATIVE   Bilirubin Urine NEGATIVE NEGATIVE   Ketones, ur NEGATIVE NEGATIVE mg/dL   Protein, ur NEGATIVE NEGATIVE mg/dL   Nitrite NEGATIVE NEGATIVE   Leukocytes,Ua NEGATIVE NEGATIVE   RBC / HPF 0-5 0 - 5 RBC/hpf   WBC, UA 0-5 0 - 5 WBC/hpf   Bacteria, UA NONE SEEN NONE SEEN   Squamous Epithelial / LPF 0-5 0 - 5   Mucus PRESENT   Pregnancy, urine   Collection Time: 04/10/19  5:01 PM  Result Value Ref Range   Preg  Test, Ur POSITIVE (A) NEGATIVE  Lipase, blood   Collection Time: 04/10/19  5:24 PM  Result Value Ref Range   Lipase 30 11 - 51 U/L  Comprehensive metabolic panel   Collection Time: 04/10/19  5:24 PM  Result Value Ref Range   Sodium 140 135 - 145 mmol/L   Potassium 3.6 3.5 - 5.1 mmol/L   Chloride 108 98 - 111 mmol/L   CO2 23 22 - 32 mmol/L   Glucose, Bld 93 70 - 99 mg/dL   BUN 14 6 - 20 mg/dL   Creatinine, Ser 7.65 0.44 - 1.00 mg/dL   Calcium 9.1 8.9 - 46.5 mg/dL   Total Protein 6.7 6.5 - 8.1 g/dL   Albumin 3.8 3.5 - 5.0 g/dL   AST  15 15 - 41 U/L   ALT 12 0 - 44 U/L   Alkaline Phosphatase 60 38 - 126 U/L   Total Bilirubin 0.5 0.3 - 1.2 mg/dL   GFR calc non Af Amer >60 >60 mL/min   GFR calc Af Amer >60 >60 mL/min   Anion gap 9 5 - 15  CBC   Collection Time: 04/10/19  5:24 PM  Result Value Ref Range   WBC 8.5 4.0 - 10.5 K/uL   RBC 4.11 3.87 - 5.11 MIL/uL   Hemoglobin 12.4 12.0 - 15.0 g/dL   HCT 03.5 46.5 - 68.1 %   MCV 91.7 80.0 - 100.0 fL   MCH 30.2 26.0 - 34.0 pg   MCHC 32.9 30.0 - 36.0 g/dL   RDW 27.5 17.0 - 01.7 %   Platelets 319 150 - 400 K/uL   nRBC 0.0 0.0 - 0.2 %  hCG, quantitative, pregnancy   Collection Time: 04/10/19  5:24 PM  Result Value Ref Range   hCG, Beta Chain, Quant, S 178 (H) <5 mIU/mL  I-Stat beta hCG blood, ED   Collection Time: 04/10/19  5:37 PM  Result Value Ref Range   I-stat hCG, quantitative 155.5 (H) <5 mIU/mL   Comment 3          ABO/Rh   Collection Time: 04/10/19  7:31 PM  Result Value Ref Range   ABO/RH(D)      O POS Performed at [facility], [address], [place], [place] [address]    US OB Comp < 14 Wks  Result Date: 04/10/2019 CLINICAL DATA:  Initial evaluation for right lower quadrant pain with vaginal bleeding. Positive beta HCG. EXAM: OBSTETRIC <14 WK [place] AND TRANSVAGINAL OB US TECHNIQUE: Both transabdominal and transvaginal ultrasound examinations were performed for complete evaluation of the gestation as well as the maternal uterus, adnexal regions, and pelvic cul-de-sac. Transvaginal technique was performed to assess early pregnancy. COMPARISON:  Prior ultrasound from 12/04/2018. FINDINGS: Intrauterine gestational sac: Negative. Endometrial thickness measures 7 mm. Yolk sac:  Negative. Embryo:  Negative. Cardiac Activity: Negative. Heart Rate: N/A  bpm Subchorionic hemorrhage:  None visualized. Maternal uterus/adnexae: The native right ovary is not visualized. No right adnexal mass. On the left, 2 adjacent complex cysts are seen within the left  ovary, the larger of which measures up to 4.7 cm, with the second cysts measuring up to 3.5 cm. Lesions demonstrate internal hypoechoic echotexture with scattered lace-like architecture, most typical for hemorrhagic cyst. No internal vascularity or solid component. Associated small volume mildly complex free fluid within the pelvis. IMPRESSION: 1. Early pregnancy with no discrete IUP or adnexal mass identified. Finding is consistent with a pregnancy of unknown anatomic location. Differential considerations include  IUP to early to visualize, recent SAB, or possibly occult ectopic pregnancy. Close clinical monitoring with serial beta HCGs and close interval follow-up ultrasound recommended as clinically warranted. 2. Two adjacent complex left ovarian cysts measuring up to 3.5 cm and 4.7 cm respectively. Appearance is most typical for hemorrhagic cysts. Associated small volume mildly complex free fluid within the pelvis could reflect recent cyst rupture. If the patient is not otherwise imaged in the interim, a short interval follow-up ultrasound in 6-12 weeks would be recommended for further evaluation. Electronically Signed   By: Jeannine Boga M.D.   On: 04/10/2019 21:35   US OB Transvaginal  Result Date: 04/10/2019 CLINICAL DATA:  Initial evaluation for right lower quadrant pain with vaginal bleeding. Positive beta HCG. EXAM: OBSTETRIC <14 WK [place] AND TRANSVAGINAL OB US TECHNIQUE: Both transabdominal and transvaginal ultrasound examinations were performed for complete evaluation of the gestation as well as the maternal uterus, adnexal regions, and pelvic cul-de-sac. Transvaginal technique was performed to assess early pregnancy. COMPARISON:  Prior ultrasound from 12/04/2018. FINDINGS: Intrauterine gestational sac: Negative. Endometrial thickness measures 7 mm. Yolk sac:  Negative. Embryo:  Negative. Cardiac Activity: Negative. Heart Rate: N/A  bpm Subchorionic hemorrhage:  None visualized. Maternal  uterus/adnexae: The native right ovary is not visualized. No right adnexal mass. On the left, 2 adjacent complex cysts are seen within the left ovary, the larger of which measures up to 4.7 cm, with the second cysts measuring up to 3.5 cm. Lesions demonstrate internal hypoechoic echotexture with scattered lace-like architecture, most typical for hemorrhagic cyst. No internal vascularity or solid component. Associated small volume mildly complex free fluid within the pelvis. IMPRESSION: 1. Early pregnancy with no discrete IUP or adnexal mass identified. Finding is consistent with a pregnancy of unknown anatomic location. Differential considerations include IUP to early to visualize, recent SAB, or possibly occult ectopic pregnancy. Close clinical monitoring with serial beta HCGs and close interval follow-up ultrasound recommended as clinically warranted. 2. Two adjacent complex left ovarian cysts measuring up to 3.5 cm and 4.7 cm respectively. Appearance is most typical for hemorrhagic cysts. Associated small volume mildly complex free fluid within the pelvis could reflect recent cyst rupture. If the patient is not otherwise imaged in the interim, a short interval follow-up ultrasound in 6-12 weeks would be recommended for further evaluation. Electronically Signed   By: Jeannine Boga M.D.   On: 04/10/2019 21:35       Assessment and Plan:     1. Pregnancy with inconclusive fetal viability, single or unspecified fetus Informed pt the high likely hood of spontaneous abortion.  Pt appropriately sad, discussed future conception and contraception plans.  Will follow up in 3-4 months.  She desires to try for future pregnancy and declines contraception.   - Beta hCG quant (ref lab)       I discussed the assessment and treatment plan with the patient. The patient was provided an opportunity to ask questions and all were answered. The patient agreed with the plan and demonstrated an understanding of the  instructions.   The patient was advised to call back or seek an in-person evaluation/go to the ED if the symptoms worsen or if the condition fails to improve as anticipated.     Cherre Blanc, MD [facility], [place]

## 2020-02-21 ENCOUNTER — Telehealth: Payer: Self-pay | Admitting: Family Medicine

## 2020-02-21 NOTE — Telephone Encounter (Signed)
I called Zaydee back and she reports her breasts have been engorged for a month or so and hurt really bad. States she noticed leakage last night that looked like colostrum or breast milk. States she last breasfed over 1 year ago.  States temperature 99.5-100.1. Denies any reddened areas on breast. States she is not pregnant as far as she is aware. Advised to call us back if temperature 100.4 or higher. Advised ice pack 20 minutes 3 times a day.  Advised do not stimulate breasts, pump or squeeze milk out of breasts. Informed her I will discuss with provider and call her back. Olia Hinderliter,RN

## 2020-02-21 NOTE — Telephone Encounter (Signed)
ok thanks, pt said that she has leakage in breast for month, severe pain , n/v, Neg Covid test last week, home preg test neg. irregular period, dizzy, pt request a call ASAP.Marland Kitchen

## 2020-02-21 NOTE — Telephone Encounter (Signed)
Discussed patients complaints and assessment with [place]. No new orders. Advised patient to continue ice packs and avoiding breast stimulation as previously discussed. Advised if temperature 100.4 or higher to call us; if over weekend go to ER or Urgent care. If no fever but engorgement worsens to call us back next week to be seen. She voices understanding. Nasiyah Laverdiere,RN

## 2020-03-09 ENCOUNTER — Ambulatory Visit (INDEPENDENT_AMBULATORY_CARE_PROVIDER_SITE_OTHER): Payer: Medicaid Other | Admitting: Obstetrics & Gynecology

## 2020-03-09 ENCOUNTER — Other Ambulatory Visit: Payer: Self-pay

## 2020-03-09 ENCOUNTER — Encounter: Payer: Self-pay | Admitting: Obstetrics & Gynecology

## 2020-03-09 VITALS — BP 133/86 | HR 93 | Ht 65.0 in | Wt 146.0 lb

## 2020-03-09 DIAGNOSIS — N643 Galactorrhea not associated with childbirth: Secondary | ICD-10-CM | POA: Diagnosis not present

## 2020-03-09 DIAGNOSIS — N938 Other specified abnormal uterine and vaginal bleeding: Secondary | ICD-10-CM

## 2020-03-09 LAB — POCT PREGNANCY, URINE: Preg Test, Ur: NEGATIVE

## 2020-03-09 MED ORDER — DROSPIRENONE-ETHINYL ESTRADIOL 3-0.03 MG PO TABS: 1.0000 | ORAL_TABLET | Freq: Every day | ORAL | 11 refills | Status: DC

## 2020-03-09 NOTE — Progress Notes (Signed)
Patient ID: Donna Gordon, female   DOB: 1980/09/14, 40 y.o.   MRN: [id number]  Chief Complaint  Patient presents with  . Gynecologic Exam    HPI Donna Gordon is a 40 y.o. female.  [id number] Patient's last menstrual period was 03/02/2020. Patient notes bilateral nipple discharge and dysmenorrhea and heavy menses. S/P hysteroscopy 2019 HPI  Past Medical History:  Diagnosis Date  . Asthma    prn inhaler  . Depression   . Family history of adverse reaction to anesthesia    maternal grandmother has hx. of being hard to wake up post-op  . GERD (gastroesophageal reflux disease)   . Migraines   . Uterine polyp 08/2017    Past Surgical History:  Procedure Laterality Date  . CESAREAN SECTION N/A 12/02/2015   Procedure: CESAREAN SECTION;  Surgeon: Federico Flake, MD;  Location: [facility]IRTHING SUIT[facility];  Service: Obstetrics;  Laterality: N/A;  . CHOLECYSTECTOMY, LAPAROSCOPIC  10/07/2014  . DILATATION & CURETTAGE/HYSTEROSCOPY WITH MYOSURE N/A 09/06/2017   Procedure: DILATATION & CURETTAGE/HYSTEROSCOPY;  Surgeon: Adam Phenix, MD;  Location: [facility];  Service: Gynecology;  Laterality: N/A;  . MYOMECTOMY ABDOMINAL APPROACH  08/06/2007    Family History  Problem Relation Age of Onset  . Cancer Paternal Aunt 31       pancreatic   . Cancer Maternal Grandmother 65       breast  . Anesthesia problems Maternal Grandmother        hard to wake up post-op  . Thyroid cancer Mother     Social History Social History   Tobacco Use  . Smoking status: Current Every Day Smoker    Packs/day: 0.25    Years: 12.00    Pack years: 3.00    Types: Cigarettes  . Smokeless tobacco: Never Used  Vaping Use  . Vaping Use: Never used  Substance Use Topics  . Alcohol use: No  . Drug use: No    Allergies  Allergen Reactions  . Other Hives    SWEET AND SOUR SAUCE    Current Outpatient Medications  Medication Sig Dispense Refill  . albuterol (PROVENTIL HFA;VENTOLIN  HFA) 108 (90 Base) MCG/ACT inhaler Inhale 1 puff into the lungs every 6 (six) hours as needed for wheezing or shortness of breath.    Marland Kitchen ibuprofen (ADVIL,MOTRIN) 600 MG tablet Take 1 tablet (600 mg total) by mouth every 6 (six) hours as needed. 30 tablet 1  . drospirenone-ethinyl estradiol (YASMIN 28) 3-0.03 MG tablet Take 1 tablet by mouth daily. 28 tablet 11  . oxyCODONE-acetaminophen (PERCOCET/ROXICET) 5-325 MG tablet Take 1-2 tablets by mouth every 6 (six) hours as needed. 12 tablet 0   No current facility-administered medications for this visit.    Review of Systems Review of Systems  Constitutional: Negative.   Genitourinary: Positive for menstrual problem.       Nipple discharge bilateral   Neurological: Negative for headaches.    Blood pressure 133/86, pulse 93, height 5\' 5"  (1.651 m), weight 146 lb (66.2 kg), last menstrual period 03/02/2020.  Physical Exam Physical Exam Vitals and nursing note reviewed. Exam conducted with a chaperone present.  Constitutional:      Appearance: Normal appearance.  Pulmonary:     Effort: Pulmonary effort is normal.  Chest:  Breasts:     Right: Normal. No nipple discharge.     Left: Normal. No nipple discharge.    Abdominal:     General: Abdomen is flat.     Palpations: Abdomen is soft.  Neurological:     Mental Status: She is alert.     Data Reviewed Pap nl 2020  Assessment Galactorrhea in female - Plan: Prolactin, Beta hCG quant (ref lab), Beta hCG quant (ref lab), CANCELED: B-HCG Quant  DUB (dysfunctional uterine bleeding) - Plan: drospirenone-ethinyl estradiol (YASMIN 28) 3-0.03 MG tablet, Beta hCG quant (ref lab)    Plan Cycle control with OCP PRL level If abnormal labs with refer as indicated    Scheryl Darter 03/09/2020, 3:43 PM

## 2020-03-09 NOTE — Progress Notes (Signed)
Here to discuss getting hysterectomy due to c/o pain and severe bleeding for over 1 year. History of ablation in 2019. Also c/o breast issue- very tender, hurt like period starting. Josselin Gaulin,RN

## 2020-03-09 NOTE — Patient Instructions (Signed)
Galactorrhea Galactorrhea is the flow of a milky fluid (discharge) from the breast. It is different from normal milk in nursing mothers. The fluid can be white, yellow, or green. This condition can be caused by many things. Most cases are not serious and do not require treatment. Watch your condition to make sure it goes away. What are the causes?  Irritation of the breast. This may be caused by: ? Injury on the breast. ? Touching breasts during sex. ? Clothes rubbing against the nipple.  Some medicines, birth control pills, or herbs.  Changes in hormones.  Stress. What are the signs or symptoms? The main symptom of this condition is a milky discharge from the breast. The discharge may be white, yellow, or green. How is this treated? You may get well without treatment. Your doctor will watch your condition to make sure that it gets better. Sometimes your doctor will decide that you need treatment. If you need treatment:  The doctor will treat any injury or irritation on your breast.  The doctor will treat you for problems in your hormones.  You may be asked to stop some medicines, if they are causing your symptoms. Follow these instructions at home: Breast care  Watch your condition for any changes.  Do not squeeze your breasts or nipples.  Avoid touching your breasts when you are having sex.  Perform a breast self-exam once a month.  Avoid clothes that rub on your nipples.  Use breast pads to absorb the fluid.  Wear a support bra or a breast binder.   General instructions  Take over-the-counter and prescription medicines only as told by your doctor.  Keep all follow-up visits. Contact a doctor if:  You have hot flashes.  You have vaginal dryness.  You have no desire for sex.  You stop having periods, or have periods that are irregular or far apart.  You have headaches.  You cannot see well. Get help right away if:  Your breast discharge is bloody or  yellowish and looks like pus.  You have breast pain.  You feel a lump in your breast.  Your breast shows wrinkling or dimpling.  Your breast becomes red and swollen. Summary  Galactorrhea is the flow of a milky fluid (discharge) from the breast.  This condition may be caused by many things, but it is not usually serious.  Watch your condition carefully to make sure that it goes away.  Get help right away if the fluid is bloody or yellowish, or if you have a lump, pain, or skin changes on your breast. This information is not intended to replace advice given to you by your health care provider. Make sure you discuss any questions you have with your health care provider. Document Revised: 10/28/2019 Document Reviewed: 10/28/2019 Elsevier Patient Education  2021 Elsevier Inc.  

## 2020-03-09 NOTE — Progress Notes (Deleted)
Patient ID: Donna Gordon, female   DOB: 07-01-1980, 40 y.o.   MRN: [id number]  Chief Complaint  Patient presents with  . Gynecologic Exam    HPI Donna Gordon is a 40 y.o. female.  *** HPI  Past Medical History:  Diagnosis Date  . Asthma    prn inhaler  . Depression   . Family history of adverse reaction to anesthesia    maternal grandmother has hx. of being hard to wake up post-op  . GERD (gastroesophageal reflux disease)   . Migraines   . Uterine polyp 08/2017    Past Surgical History:  Procedure Laterality Date  . CESAREAN SECTION N/A 12/02/2015   Procedure: CESAREAN SECTION;  Surgeon: Federico Flake, MD;  Location: [facility] BIRTHING SUIT[facility];  Service: Obstetrics;  Laterality: N/A;  . CHOLECYSTECTOMY, LAPAROSCOPIC  10/07/2014  . DILATATION & CURETTAGE/HYSTEROSCOPY WITH MYOSURE N/A 09/06/2017   Procedure: DILATATION & CURETTAGE/HYSTEROSCOPY;  Surgeon: Adam Phenix, MD;  Location: [facility];  Service: Gynecology;  Laterality: N/A;  . MYOMECTOMY ABDOMINAL APPROACH  08/06/2007    Family History  Problem Relation Age of Onset  . Cancer Paternal Aunt 31       pancreatic   . Cancer Maternal Grandmother 71       breast  . Anesthesia problems Maternal Grandmother        hard to wake up post-op  . Thyroid cancer Mother     Social History Social History   Tobacco Use  . Smoking status: Current Every Day Smoker    Packs/day: 0.25    Years: 12.00    Pack years: 3.00    Types: Cigarettes  . Smokeless tobacco: Never Used  Vaping Use  . Vaping Use: Never used  Substance Use Topics  . Alcohol use: No  . Drug use: No    Allergies  Allergen Reactions  . Other Hives    SWEET AND SOUR SAUCE    Current Outpatient Medications  Medication Sig Dispense Refill  . albuterol (PROVENTIL HFA;VENTOLIN HFA) 108 (90 Base) MCG/ACT inhaler Inhale 1 puff into the lungs every 6 (six) hours as needed for wheezing or shortness of breath.    Marland Kitchen ibuprofen  (ADVIL,MOTRIN) 600 MG tablet Take 1 tablet (600 mg total) by mouth every 6 (six) hours as needed. 30 tablet 1  . drospirenone-ethinyl estradiol (YASMIN 28) 3-0.03 MG tablet Take 1 tablet by mouth daily. 28 tablet 11  . oxyCODONE-acetaminophen (PERCOCET/ROXICET) 5-325 MG tablet Take 1-2 tablets by mouth every 6 (six) hours as needed. 12 tablet 0   No current facility-administered medications for this visit.    Review of Systems Review of Systems  Blood pressure 133/86, pulse 93, height 5\' 5"  (1.651 m), weight 146 lb (66.2 kg), last menstrual period 03/02/2020.  Physical Exam Physical Exam  Data Reviewed ***  Assessment ***  Plan ***    03/04/2020 03/09/2020, 3:43 PM

## 2020-03-10 LAB — PROLACTIN: Prolactin: 10 ng/mL (ref 4.8–23.3)

## 2020-03-10 LAB — BETA HCG QUANT (REF LAB): hCG Quant: <1 m[IU]/mL

## 2020-05-04 ENCOUNTER — Telehealth: Payer: Self-pay | Admitting: General Practice

## 2020-05-04 NOTE — Telephone Encounter (Signed)
Patient called and left message on nurse voicemail line stating she is having an abnormal period. Patient states she has been off her last period for 7 days now and just starting spotting yesterday and is having mild cramping. She has taken a pregnancy test and it is negative. Patient would like advice on what to do or an appt to come in.   Called patient and she states she has a history of irregular periods but nothing like this has happened before. Patient states the last time something similar to this happened she was pregnant. Patient did not start taking the birth control pills since last visit as she decided to try to get pregnant before having a hysterectomy. Discussed with Dr Alvester Morin who advises patient continue to monitor things from home and take a repeat UPT in a week. Patient should call us back if bleeding becomes severe. Discussed with patient. Patient verbalized understanding & will call back if other concerns arise.

## 2020-05-27 ENCOUNTER — Ambulatory Visit: Payer: Medicaid Other | Admitting: Obstetrics & Gynecology

## 2020-06-16 ENCOUNTER — Telehealth: Payer: Self-pay

## 2020-06-16 NOTE — Telephone Encounter (Addendum)
Called pt in regards to her concern about painful periods.  Per chart review pt had an appt on 05/27/20 for painful periods and pt was prescribed BCP.  Pt informed me that she this period was more painful that her past ones.  I asked pt if she is taking the Yasmin as it would help with painful periods.  Pt reports that she is trying to get pregnant.  I advised pt to call and schedule an appt with a provider so that she can have that discussion.  Pt verbalized understanding.   Addison Naegeli, RN 06/16/20

## 2020-06-17 ENCOUNTER — Encounter (HOSPITAL_COMMUNITY): Payer: Self-pay | Admitting: *Deleted

## 2020-06-17 ENCOUNTER — Other Ambulatory Visit: Payer: Self-pay

## 2020-06-17 ENCOUNTER — Emergency Department (HOSPITAL_COMMUNITY)
Admission: EM | Admit: 2020-06-17 | Discharge: 2020-06-17 | Disposition: A | Discharge status: Home / Self Care | Payer: Medicaid Other | Attending: Emergency Medicine | Admitting: Emergency Medicine

## 2020-06-17 ENCOUNTER — Emergency Department (HOSPITAL_COMMUNITY): Payer: Medicaid Other

## 2020-06-17 ENCOUNTER — Ambulatory Visit: Payer: Self-pay | Admitting: *Deleted

## 2020-06-17 DIAGNOSIS — R1030 Lower abdominal pain, unspecified: Secondary | ICD-10-CM | POA: Insufficient documentation

## 2020-06-17 DIAGNOSIS — R140 Abdominal distension (gaseous): Secondary | ICD-10-CM | POA: Diagnosis not present

## 2020-06-17 DIAGNOSIS — N939 Abnormal uterine and vaginal bleeding, unspecified: Secondary | ICD-10-CM | POA: Diagnosis not present

## 2020-06-17 DIAGNOSIS — J45909 Unspecified asthma, uncomplicated: Secondary | ICD-10-CM | POA: Diagnosis not present

## 2020-06-17 DIAGNOSIS — N898 Other specified noninflammatory disorders of vagina: Secondary | ICD-10-CM | POA: Insufficient documentation

## 2020-06-17 DIAGNOSIS — R110 Nausea: Secondary | ICD-10-CM | POA: Diagnosis not present

## 2020-06-17 DIAGNOSIS — F17210 Nicotine dependence, cigarettes, uncomplicated: Secondary | ICD-10-CM | POA: Insufficient documentation

## 2020-06-17 DIAGNOSIS — K219 Gastro-esophageal reflux disease without esophagitis: Secondary | ICD-10-CM | POA: Diagnosis not present

## 2020-06-17 DIAGNOSIS — R102 Pelvic and perineal pain: Secondary | ICD-10-CM | POA: Diagnosis not present

## 2020-06-17 LAB — URINALYSIS, ROUTINE W REFLEX MICROSCOPIC
Bacteria, UA: NONE SEEN
Bilirubin Urine: NEGATIVE
Glucose, UA: NEGATIVE mg/dL
Ketones, ur: NEGATIVE mg/dL
Leukocytes,Ua: NEGATIVE
Nitrite: NEGATIVE
Protein, ur: NEGATIVE mg/dL
RBC / HPF: >50 RBC/hpf — ABNORMAL HIGH (ref 0–5)
Specific Gravity, Urine: 1.026 (ref 1.005–1.030)
pH: 5 (ref 5.0–8.0)

## 2020-06-17 LAB — COMPREHENSIVE METABOLIC PANEL
ALT: 11 U/L (ref 0–44)
AST: 14 U/L — ABNORMAL LOW (ref 15–41)
Albumin: 4 g/dL (ref 3.5–5.0)
Alkaline Phosphatase: 70 U/L (ref 38–126)
Anion gap: 6 (ref 5–15)
BUN: 11 mg/dL (ref 6–20)
CO2: 24 mmol/L (ref 22–32)
Calcium: 8.8 mg/dL — ABNORMAL LOW (ref 8.9–10.3)
Chloride: 108 mmol/L (ref 98–111)
Creatinine, Ser: 0.73 mg/dL (ref 0.44–1.00)
GFR, Estimated: >60 mL/min (ref >60)
Glucose, Bld: 94 mg/dL (ref 70–99)
Potassium: 3.7 mmol/L (ref 3.5–5.1)
Sodium: 138 mmol/L (ref 135–145)
Total Bilirubin: 0.4 mg/dL (ref 0.3–1.2)
Total Protein: 7 g/dL (ref 6.5–8.1)

## 2020-06-17 LAB — CBC WITH DIFFERENTIAL/PLATELET
Abs Immature Granulocytes: 0.05 10*3/uL (ref 0.00–0.07)
Basophils Absolute: 0.1 10*3/uL (ref 0.0–0.1)
Basophils Relative: 0 %
Eosinophils Absolute: 0.1 10*3/uL (ref 0.0–0.5)
Eosinophils Relative: 1 %
HCT: 41.1 % (ref 36.0–46.0)
Hemoglobin: 13.6 g/dL (ref 12.0–15.0)
Immature Granulocytes: 0 %
Lymphocytes Relative: 18 %
Lymphs Abs: 2 10*3/uL (ref 0.7–4.0)
MCH: 30.3 pg (ref 26.0–34.0)
MCHC: 33.1 g/dL (ref 30.0–36.0)
MCV: 91.5 fL (ref 80.0–100.0)
Monocytes Absolute: 0.6 10*3/uL (ref 0.1–1.0)
Monocytes Relative: 5 %
Neutro Abs: 8.8 10*3/uL — ABNORMAL HIGH (ref 1.7–7.7)
Neutrophils Relative %: 76 %
Platelets: 290 10*3/uL (ref 150–400)
RBC: 4.49 MIL/uL (ref 3.87–5.11)
RDW: 13.1 % (ref 11.5–15.5)
WBC: 11.7 10*3/uL — ABNORMAL HIGH (ref 4.0–10.5)
nRBC: 0 % (ref 0.0–0.2)

## 2020-06-17 LAB — WET PREP, GENITAL
Clue Cells Wet Prep HPF POC: NONE SEEN
Sperm: NONE SEEN
Trich, Wet Prep: NONE SEEN
Yeast Wet Prep HPF POC: NONE SEEN

## 2020-06-17 LAB — I-STAT BETA HCG BLOOD, ED (MC, WL, AP ONLY): I-stat hCG, quantitative: <5 m[IU]/mL (ref <5)

## 2020-06-17 LAB — LIPASE, BLOOD: Lipase: 34 U/L (ref 11–51)

## 2020-06-17 MED ORDER — ACETAMINOPHEN 500 MG PO TABS
1000.0000 mg | ORAL_TABLET | Freq: Once | ORAL | Status: AC
  Administered 2020-06-17: 1000 mg via ORAL
  Filled 2020-06-17: qty 2

## 2020-06-17 MED ORDER — IBUPROFEN 800 MG PO TABS
800.0000 mg | ORAL_TABLET | Freq: Once | ORAL | Status: AC
  Administered 2020-06-17: 800 mg via ORAL
  Filled 2020-06-17: qty 1

## 2020-06-17 MED ORDER — IOHEXOL 300 MG/ML  SOLN
100.0000 mL | Freq: Once | INTRAMUSCULAR | Status: AC | PRN
  Administered 2020-06-17: 100 mL via INTRAVENOUS

## 2020-06-17 NOTE — Discharge Instructions (Addendum)
You were seen in the ER today for your vaginal bleeding and abdominal pain.  Your physical exam and vital signs were very reassuring as was your blood work and your imaging tests.  While the exact cause of your vaginal bleeding and pain remains unclear, there does not appear to be any emergent issue at this time.  You may continue to take Advil or naproxen and Tylenol at home as needed for your pain.  Please follow-up closely with your OB/GYN, or contact the women Center listed below.  Return to the ER if you develop any worsening vaginal bleeding, severe abdominal pain, nausea or vomiting does not stop, or any other new severe symptoms.

## 2020-06-17 NOTE — ED Triage Notes (Signed)
Pt complains of abdominal pain, bloating, and vaginal bleeding since yesterday. Some nausea, no vomiting or diarrhea.

## 2020-06-17 NOTE — Telephone Encounter (Signed)
Pt called stating she is having heavy vaginal bleeding, and excritiating cramps; her cramps started 06/13/20; the bleeding started started 06/16/20; the pt says she is having "lots of clotting"; the pt says she is wearing 2 pads and changing them every 1.5-2 hours; her LMP was 14 days ago; the pt has contacted OB/GYN but feels like they are not listening to her; recommendations made per nurse triage protocol; she verbalized understanding and will go to the ED.  Reason for Disposition . SEVERE abdominal pain  Answer Assessment - Initial Assessment Questions 1. AMOUNT: "Describe the bleeding that you are having."    - SPOTTING: spotting, or pinkish / brownish mucous discharge; does not fill panty liner or pad    - MILD:  less than 1 pad / hour; less than patient's usual menstrual bleeding   - MODERATE: 1-2 pads / hour; 1 menstrual cup every 6 hours; small-medium blood clots (e.g., pea, grape, small coin)   - SEVERE: soaking 2 or more pads/hour for 2 or more hours; 1 menstrual cup every 2 hours; bleeding not contained by pads or continuous red blood from vagina; large blood clots (e.g., golf ball, large coin)      Severe; passing "lots of clots" 2. ONSET: "When did the bleeding begin?" "Is it continuing now?"     06/16/20 3. MENSTRUAL PERIOD: "When was the last normal menstrual period?" "How is this different than your period?"    LMP 14 days ago; more painful 4. REGULARITY: "How regular are your periods?"      5. ABDOMINAL PAIN: "Do you have any pain?" "How bad is the pain?"  (e.g., Scale 1-10; mild, moderate, or severe)   - MILD (1-3): doesn't interfere with normal activities, abdomen soft and not tender to touch    - MODERATE (4-7): interferes with normal activities or awakens from sleep, abdomen tender to touch    - SEVERE (8-10): excruciating pain, doubled over, unable to do any normal activities      severe 6. PREGNANCY: "Could you be pregnant?" "Are you sexually active?" "Did you recently give  birth?"     7. BREASTFEEDING: "Are you breastfeeding?"     8. HORMONES: "Are you taking any hormone medications, prescription or OTC?" (e.g., birth control pills, estrogen)     9. BLOOD THINNERS: "Do you take any blood thinners?" (e.g., Coumadin/warfarin, Pradaxa/dabigatran, aspirin)     no 10. CAUSE: "What do you think is causing the bleeding?" (e.g., recent gyn surgery, recent gyn procedure; known bleeding disorder, cervical cancer, polycystic ovarian disease, fibroids)         unsure 11. HEMODYNAMIC STATUS: "Are you weak or feeling lightheaded?" If Yes, ask: "Can you stand and walk normally?"      Dizziness and weakness 12. OTHER SYMPTOMS: "What other symptoms are you having with the bleeding?" (e.g., passed tissue, vaginal discharge, fever, menstrual-type cramps)      Cramping; back, abdominal and pelvic pain; leg numbness  Protocols used: VAGINAL BLEEDING - ABNORMAL-A-AH

## 2020-06-17 NOTE — ED Provider Notes (Signed)
Emergency Medicine Provider Triage Evaluation Note  Donna [facility] , a 40 y.o. female  was evaluated in triage.  Pt complains of heavy vaginal bleeding, abdominal pain and bloating since yesterday.  Abdominal pain is located in bilateral lower quadrants.  Also endorses associated nausea without emesis.  States her last period was 2 weeks ago and she should not be on her cycle now.  Admits to history of fibroids. Took tylenol PTA.  Review of Systems  Positive: Abdominal pain, vaginal bleeding, nausea Negative: Fever, chills, urinary symptoms, vaginal discharge  Physical Exam  BP (!) 141/94 (BP Location: Left Arm)   Pulse 96   Temp 98 F (36.7 C) (Oral)   Resp 18   LMP 06/03/2020   SpO2 96%  Gen:   Awake, no distress   Resp:  Normal effort  MSK:   Moves extremities without difficulty  Other:  No abdominal tenderness with deep palpation  Medical Decision Making  Medically screening exam initiated at 1:31 PM.  Appropriate orders placed.  Maite Hohn was informed that the remainder of the evaluation will be completed by another provider, this initial triage assessment does not replace that evaluation, and the importance of remaining in the ED until their evaluation is complete.  Abdominal labs and pregnancy test ordered.  We will watch her pregnancy test result and order appropriate vaginal ultrasound.    Portions of this note were generated with Scientist, clinical (histocompatibility and immunogenetics). Dictation errors may occur despite best attempts at proofreading.    Shanon Ace, PA-C 06/17/20 1356    Linwood Dibbles, MD 06/18/20 [phone]

## 2020-06-17 NOTE — ED Provider Notes (Signed)
[facility]-EMERGENCY DEPT Provider Note   CSN: [id number] Arrival date & time: 06/17/20  1317     History Chief Complaint  Patient presents with  . Abdominal Pain  . Vaginal Bleeding    Donna Gordon is a 40 y.o. female who presents with concern for 24 hours of heavy vaginal bleeding, abdominal bloating, and lower abdominal pain.  Pain is in bilateral lower quadrants with associated nausea without emesis.  She states that her LMP was 5/25, approximately 2 weeks ago.  She is history of irregular periods and fibroids in the past, is attempting to conceive at this time.  Did have a miscarriage in 03/2019.  She denies any pain with urination, urinary frequency or urgency.  She does endorse milky white vaginal discharge that started prior to onset of her vaginal bleeding.  Denies any fevers or chills at home, chest pain, shortness of breath, palpitations, or syncope.  I personally reviewed this patient's medical records.  She has history of asthma and GERD.  She is not on any medications every day.  HPI     Past Medical History:  Diagnosis Date  . Asthma    prn inhaler  . Depression   . Family history of adverse reaction to anesthesia    maternal grandmother has hx. of being hard to wake up post-op  . GERD (gastroesophageal reflux disease)   . Migraines   . Uterine polyp 08/2017    Patient Active Problem List   Diagnosis Date Noted  . Pelvic pain in female 11/22/2018  . DUB (dysfunctional uterine bleeding) 06/21/2017  . S/P primary low transverse C-section 12/02/2015  . Irritable bowel syndrome 08/05/2011  . Anxiety and depression 02/21/2011    Past Surgical History:  Procedure Laterality Date  . CESAREAN SECTION N/A 12/02/2015   Procedure: CESAREAN SECTION;  Surgeon: Federico FlakeKimberly Niles Newton, MD;  [facility];  Service: Obstetrics;  Laterality: N/A;  . CHOLECYSTECTOMY, LAPAROSCOPIC  10/07/2014  . DILATATION & CURETTAGE/HYSTEROSCOPY  WITH MYOSURE N/A 09/06/2017   Procedure: DILATATION & CURETTAGE/HYSTEROSCOPY;  Surgeon: Adam PhenixArnold, James G, MD;  Location: [facility];  Service: Gynecology;  Laterality: N/A;  . MYOMECTOMY ABDOMINAL APPROACH  08/06/2007     OB History    Gravida  2   Para  1   Term  1   Preterm  0   AB  1   Living  1     SAB  0   IAB  0   Ectopic  0   Multiple  0   Live Births  1           Family History  Problem Relation Age of Onset  . Cancer Paternal Aunt 31       pancreatic   . Cancer Maternal Grandmother [id number]       breast  . Anesthesia problems Maternal Grandmother        hard to wake up post-op  . Thyroid cancer Mother     Social History   Tobacco Use  . Smoking status: Current Every Day Smoker    Packs/day: 0.25    Years: 12.00    Pack years: 3.00    Types: Cigarettes  . Smokeless tobacco: Never Used  Vaping Use  . Vaping Use: Never used  Substance Use Topics  . Alcohol use: No  . Drug use: No    Home Medications Prior to Admission medications   Medication Sig Start Date End Date Taking? Authorizing Provider  albuterol (  PROVENTIL HFA;VENTOLIN HFA) 108 (90 Base) MCG/ACT inhaler Inhale 1 puff into the lungs every 6 (six) hours as needed for wheezing or shortness of breath.   Yes [provider]  drospirenone-ethinyl estradiol (YASMIN 28) 3-0.03 MG tablet Take 1 tablet by mouth daily. Patient not taking: Reported on 06/17/2020 03/09/20   Adam Phenix, MD  ibuprofen (ADVIL,MOTRIN) 600 MG tablet Take 1 tablet (600 mg total) by mouth every 6 (six) hours as needed. Patient not taking: Reported on 06/17/2020 09/06/17   Adam Phenix, MD  oxyCODONE-acetaminophen (PERCOCET/ROXICET) 5-325 MG tablet Take 1-2 tablets by mouth every 6 (six) hours as needed. Patient not taking: Reported on 06/17/2020 09/06/17   Adam Phenix, MD    Allergies    Other  Review of Systems   Review of Systems  Constitutional: Positive for chills. Negative for activity  change, appetite change, diaphoresis, fatigue, fever and unexpected weight change.  HENT: Negative.   Respiratory: Negative.   Cardiovascular: Negative.   Gastrointestinal: Positive for abdominal distention and abdominal pain. Negative for blood in stool, constipation, diarrhea, nausea, rectal pain and vomiting.  Genitourinary: Positive for menstrual problem, pelvic pain, vaginal bleeding, vaginal discharge and vaginal pain. Negative for decreased urine volume, difficulty urinating, dyspareunia, dysuria, enuresis, flank pain, frequency, genital sores, hematuria and urgency.  Musculoskeletal: Negative.   Skin: Negative.   Neurological: Negative.   Hematological: Negative.     Physical Exam Updated Vital Signs BP (!) 139/118   Pulse 62   Temp 98 F (36.7 C) (Oral)   Resp 16   LMP 06/03/2020   SpO2 100%   Physical Exam Vitals and nursing note reviewed. Exam conducted with a chaperone present.  Constitutional:      Appearance: She is normal weight. She is not toxic-appearing.  HENT:     Head: Normocephalic and atraumatic.     Nose: Nose normal.     Mouth/Throat:     Mouth: Mucous membranes are moist.     Pharynx: Oropharynx is clear. Uvula midline. No oropharyngeal exudate, posterior oropharyngeal erythema or uvula swelling.     Tonsils: No tonsillar exudate.  Eyes:     General: Lids are normal. Vision grossly intact.        Right eye: No discharge.        Left eye: No discharge.     Extraocular Movements: Extraocular movements intact.     Conjunctiva/sclera: Conjunctivae normal.     Pupils: Pupils are equal, round, and reactive to light.  Neck:     Trachea: Trachea and phonation normal.  Cardiovascular:     Rate and Rhythm: Normal rate and regular rhythm.     Pulses: Normal pulses.     Heart sounds: Normal heart sounds. No murmur heard.   Pulmonary:     Effort: Pulmonary effort is normal. No tachypnea, bradypnea, accessory muscle usage, prolonged expiration or  respiratory distress.     Breath sounds: Normal breath sounds. No wheezing or rales.  Chest:     Chest wall: No mass, lacerations, deformity, swelling, tenderness, crepitus or edema.  Abdominal:     General: Bowel sounds are normal. There is distension.     Palpations: Abdomen is soft.     Tenderness: There is abdominal tenderness in the right lower quadrant, suprapubic area and left lower quadrant. There is no right CVA tenderness, left CVA tenderness, guarding or rebound. Negative signs include Murphy's sign.  Genitourinary:    General: Normal vulva.     Exam position: Lithotomy  position.     Vagina: Bleeding present.     Cervix: Normal.     Uterus: Normal.      Adnexa: Right adnexa normal.       Left: Tenderness and fullness present.   Musculoskeletal:        General: No deformity.     Cervical back: Normal range of motion and neck supple. No edema, rigidity or crepitus. No pain with movement, spinous process tenderness or muscular tenderness.     Right lower leg: No edema.     Left lower leg: No edema.  Lymphadenopathy:     Cervical: No cervical adenopathy.  Skin:    General: Skin is warm and dry.     Capillary Refill: Capillary refill takes less than 2 seconds.     Findings: No rash.  Neurological:     General: No focal deficit present.     Mental Status: She is alert and oriented to person, place, and time. Mental status is at baseline.  Psychiatric:        Mood and Affect: Mood normal. Affect is tearful.     ED Results / Procedures / Treatments   Labs (all labs ordered are listed, but only abnormal results are displayed) Labs Reviewed  WET PREP, GENITAL - Abnormal; Notable for the following components:      Result Value   WBC, Wet Prep HPF POC RARE (*)    All other components within normal limits  COMPREHENSIVE METABOLIC PANEL - Abnormal; Notable for the following components:   Calcium 8.8 (*)    AST 14 (*)    All other components within normal limits  CBC WITH  DIFFERENTIAL/PLATELET - Abnormal; Notable for the following components:   WBC 11.7 (*)    Neutro Abs 8.8 (*)    All other components within normal limits  LIPASE, BLOOD  URINALYSIS, ROUTINE W REFLEX MICROSCOPIC  I-STAT BETA HCG BLOOD, ED ([facility], [facility], [facility] ONLY)  GC/CHLAMYDIA PROBE AMP ([place]) NOT AT [facility]    EKG None  Radiology US PELVIC COMPLETE W TRANSVAGINAL AND TORSION R/O  Result Date: 06/17/2020 CLINICAL DATA:  Pelvic pain. EXAM: TRANSABDOMINAL AND TRANSVAGINAL ULTRASOUND OF PELVIS DOPPLER ULTRASOUND OF OVARIES TECHNIQUE: Both transabdominal and transvaginal ultrasound examinations of the pelvis were performed. Transabdominal technique was performed for global imaging of the pelvis including uterus, ovaries, adnexal regions, and pelvic cul-de-sac. It was necessary to proceed with endovaginal exam following the transabdominal exam to visualize the endometrium and ovaries. Color and duplex Doppler ultrasound was utilized to evaluate blood flow to the ovaries. COMPARISON:  December 04, 2018. FINDINGS: Uterus Measurements: 9.9 x 6.0 x 5.0 cm = volume: 159 mL. No fibroids or other mass visualized. Endometrium Thickness: 13 mm which is within normal limits for patient of reproductive age. No focal abnormality visualized. Right ovary Measurements: 3.7 x 2.1 x 1.8 cm = volume: 7 mL. Normal appearance/no adnexal mass. Left ovary Not visualized. Pulsed Doppler evaluation of right ovary demonstrates normal low-resistance arterial and venous waveforms. Other findings Trace free fluid is noted which most likely is physiologic. IMPRESSION: Left ovary is not visualized. No definite abnormality seen within the pelvis. There is no evidence of right ovarian mass or torsion. Electronically Signed   By: Lupita Raider M.D.   On: 06/17/2020 16:27    Procedures Procedures   Medications Ordered in ED Medications  ibuprofen (ADVIL) tablet 800 mg (800 mg Oral Given 06/17/20 1535)  acetaminophen (TYLENOL) tablet  1,000 mg (1,000 mg Oral  Given 06/17/20 1743)    ED Course  I have reviewed the triage vital signs and the nursing notes.  Pertinent labs & imaging results that were available during my care of the patient were reviewed by me and considered in my medical decision making (see chart for details).    MDM Rules/Calculators/A&P                         40 year old female presents with concern for vaginal bleeding, abdominal bloating, and bilateral lower abdominal pain x24 hours.  Differential diagnosis includes but limited to menorrhagia, metromenorrhagia, ectopic pregnancy, PID, polyps, adenomyosis, Malignancy or hyperplasia, coagulopathy, fibroids.  Hypertensive on intake, vital signs otherwise normal.  Cardiopulmonary exam is normal abdominal exam with abdominal distention though it is soft and tender to palpation in the bilateral lower quadrants and suprapubic area.  We will proceed with GU exam with chaperone is available.  We will stop using basic laboratory studies as well as pelvic ultrasound.  Analgesia offered, patient preferring to proceed with oral nonnarcotic pain medications as she is driving and has her young daughter at the bedside.  CBC with mild leukocytosis of 11.7, hemoglobin is normal at 13.6.  CMP unremarkable, lipase is normal, patient is not pregnant.  Pelvic ultrasound without any abnormality of the uterus or the right ovary.  Unfortunately left ovary is not identified on ultrasound.  GU exam subsequently performed with chaperone at the bedside with left adnexal fullness and tenderness to palpation without discharge from the cervix.  There is a moderate amount of bleeding present in the vaginal vault with bright red blood.  Given left adnexal fullness and equivocal pelvic ultrasound we will proceed with CT of the abdomen and pelvis for further evaluation.  Wet mount unremarkable.  UA pending at this time.  Care of this patient was signed out to oncoming ED provider Donna Helper,  PA-C at time of shift change.  All pertinent HPI, physical exam, and laboratory findings were discussed with him prior to my departure.  I appreciate his collaboration in the care of this patient.  Assuming CT scan is unremarkable, recommend close outpatient follow-up with patient's OB/GYN.  Donna Gordon voiced understanding of her medical evaluation and treatment plan thus far.  Each of her questions was answered to her expressed satisfaction.  She is amenable to plan for CT at this time.  This chart was dictated using voice recognition software, Dragon. Despite the best efforts of this provider to proofread and correct errors, errors may still occur which can change documentation meaning.  Final Clinical Impression(s) / ED Diagnoses Final diagnoses:  Pelvic pain    Rx / DC Orders ED Discharge Orders    None       Sherrilee Gilles 06/17/20 Carlyn Reichert, MD 06/18/20 [phone]

## 2020-06-17 NOTE — ED Provider Notes (Signed)
Received signout from previous provider, please see her note for complete H&P.  Patient is a 40 year old female who presents with heavy vaginal bleeding for the past 24 hours as well as lower abdominal pain.  Initial pelvic ultrasound without any acute abnormalities.  A follow-up CT scan was obtained showing no concerning finding.  At this time patient is resting comfortably in no acute discomfort.  She will follow-up closely with OB/GYN for further care.  Return precaution given.  BP 140/70   Pulse 70   Temp 98 F (36.7 C) (Oral)   Resp 16   LMP 06/03/2020   SpO2 100%   Results for orders placed or performed during the hospital encounter of 06/17/20  Wet prep, genital  Result Value Ref Range   Yeast Wet Prep HPF POC NONE SEEN NONE SEEN   Trich, Wet Prep NONE SEEN NONE SEEN   Clue Cells Wet Prep HPF POC NONE SEEN NONE SEEN   WBC, Wet Prep HPF POC RARE (A) NONE SEEN   Sperm NONE SEEN   Comprehensive metabolic panel  Result Value Ref Range   Sodium 138 135 - 145 mmol/L   Potassium 3.7 3.5 - 5.1 mmol/L   Chloride 108 98 - 111 mmol/L   CO2 24 22 - 32 mmol/L   Glucose, Bld 94 70 - 99 mg/dL   BUN 11 6 - 20 mg/dL   Creatinine, Ser 0.10 0.44 - 1.00 mg/dL   Calcium 8.8 (L) 8.9 - 10.3 mg/dL   Total Protein 7.0 6.5 - 8.1 g/dL   Albumin 4.0 3.5 - 5.0 g/dL   AST 14 (L) 15 - 41 U/L   ALT 11 0 - 44 U/L   Alkaline Phosphatase 70 38 - 126 U/L   Total Bilirubin 0.4 0.3 - 1.2 mg/dL   GFR, Estimated >27 >25 mL/min   Anion gap 6 5 - 15  Lipase, blood  Result Value Ref Range   Lipase 34 11 - 51 U/L  CBC with Differential  Result Value Ref Range   WBC 11.7 (H) 4.0 - 10.5 K/uL   RBC 4.49 3.87 - 5.11 MIL/uL   Hemoglobin 13.6 12.0 - 15.0 g/dL   HCT 36.6 44.0 - 34.7 %   MCV 91.5 80.0 - 100.0 fL   MCH 30.3 26.0 - 34.0 pg   MCHC 33.1 30.0 - 36.0 g/dL   RDW 42.5 95.6 - 38.7 %   Platelets 290 150 - 400 K/uL   nRBC 0.0 0.0 - 0.2 %   Neutrophils Relative % 76 %   Neutro Abs 8.8 (H) 1.7 - 7.7 K/uL    Lymphocytes Relative 18 %   Lymphs Abs 2.0 0.7 - 4.0 K/uL   Monocytes Relative 5 %   Monocytes Absolute 0.6 0.1 - 1.0 K/uL   Eosinophils Relative 1 %   Eosinophils Absolute 0.1 0.0 - 0.5 K/uL   Basophils Relative 0 %   Basophils Absolute 0.1 0.0 - 0.1 K/uL   Immature Granulocytes 0 %   Abs Immature Granulocytes 0.05 0.00 - 0.07 K/uL  Urinalysis, Routine w reflex microscopic Urine, Clean Catch  Result Value Ref Range   Color, Urine YELLOW YELLOW   APPearance HAZY (A) CLEAR   Specific Gravity, Urine 1.026 1.005 - 1.030   pH 5.0 5.0 - 8.0   Glucose, UA NEGATIVE NEGATIVE mg/dL   Hgb urine dipstick LARGE (A) NEGATIVE   Bilirubin Urine NEGATIVE NEGATIVE   Ketones, ur NEGATIVE NEGATIVE mg/dL   Protein, ur NEGATIVE NEGATIVE mg/dL  Nitrite NEGATIVE NEGATIVE   Leukocytes,Ua NEGATIVE NEGATIVE   RBC / HPF >50 (H) 0 - 5 RBC/hpf   WBC, UA 6-10 0 - 5 WBC/hpf   Bacteria, UA NONE SEEN NONE SEEN   Squamous Epithelial / LPF 0-5 0 - 5   Mucus PRESENT   I-Stat beta hCG blood, ED  Result Value Ref Range   I-stat hCG, quantitative <5.0 <5 mIU/mL   Comment 3           CT Abdomen Pelvis W Contrast  Result Date: 06/17/2020 CLINICAL DATA:  Left lower quadrant abdominal pain. EXAM: CT ABDOMEN AND PELVIS WITH CONTRAST TECHNIQUE: Multidetector CT imaging of the abdomen and pelvis was performed using the standard protocol following bolus administration of intravenous contrast. CONTRAST:  OMNIPAQUE IOHEXOL 300 MG/ML  SOLN COMPARISON:  February 20, 2017. FINDINGS: Lower chest: No acute abnormality. Hepatobiliary: No focal liver abnormality is seen. Status post cholecystectomy. No biliary dilatation. Pancreas: Unremarkable. No pancreatic ductal dilatation or surrounding inflammatory changes. Spleen: Normal in size without focal abnormality. Adrenals/Urinary Tract: Adrenal glands are unremarkable. Kidneys are normal, without renal calculi, focal lesion, or hydronephrosis. Bladder is unremarkable.  Stomach/Bowel: Stomach is within normal limits. Appendix appears normal. No evidence of bowel wall thickening, distention, or inflammatory changes. Vascular/Lymphatic: No significant vascular findings are present. No enlarged abdominal or pelvic lymph nodes. Reproductive: Uterus and bilateral adnexa are unremarkable. Other: Small amount of free fluid is noted posteriorly in the pelvis which most likely is physiologic. No hernia is noted. Musculoskeletal: No acute or significant osseous findings. IMPRESSION: No acute abnormality seen in the abdomen or pelvis. Electronically Signed   By: Lupita Raider M.D.   On: 06/17/2020 18:33   US PELVIC COMPLETE W TRANSVAGINAL AND TORSION R/O  Result Date: 06/17/2020 CLINICAL DATA:  Pelvic pain. EXAM: TRANSABDOMINAL AND TRANSVAGINAL ULTRASOUND OF PELVIS DOPPLER ULTRASOUND OF OVARIES TECHNIQUE: Both transabdominal and transvaginal ultrasound examinations of the pelvis were performed. Transabdominal technique was performed for global imaging of the pelvis including uterus, ovaries, adnexal regions, and pelvic cul-de-sac. It was necessary to proceed with endovaginal exam following the transabdominal exam to visualize the endometrium and ovaries. Color and duplex Doppler ultrasound was utilized to evaluate blood flow to the ovaries. COMPARISON:  December 04, 2018. FINDINGS: Uterus Measurements: 9.9 x 6.0 x 5.0 cm = volume: 159 mL. No fibroids or other mass visualized. Endometrium Thickness: 13 mm which is within normal limits for patient of reproductive age. No focal abnormality visualized. Right ovary Measurements: 3.7 x 2.1 x 1.8 cm = volume: 7 mL. Normal appearance/no adnexal mass. Left ovary Not visualized. Pulsed Doppler evaluation of right ovary demonstrates normal low-resistance arterial and venous waveforms. Other findings Trace free fluid is noted which most likely is physiologic. IMPRESSION: Left ovary is not visualized. No definite abnormality seen within the pelvis.  There is no evidence of right ovarian mass or torsion. Electronically Signed   By: Lupita Raider M.D.   On: 06/17/2020 16:27      Fayrene Helper, PA-C 06/17/20 1916    Mancel Bale, MD 06/18/20 1130

## 2020-06-17 NOTE — ED Notes (Signed)
Ultrasound bedside.

## 2020-06-17 NOTE — ED Notes (Signed)
Pt ambulated to bathroom 

## 2020-06-18 ENCOUNTER — Telehealth: Payer: Self-pay | Admitting: *Deleted

## 2020-06-18 NOTE — Telephone Encounter (Signed)
Transition Care Management Unsuccessful Follow-up Telephone Call  Date of discharge and from where:  06/17/2020 - [facility]  Attempts:  1st Attempt  Reason for unsuccessful TCM follow-up call:  Left voice message

## 2020-06-19 LAB — GC/CHLAMYDIA PROBE AMP (~~LOC~~) NOT AT ARMC
Chlamydia: NEGATIVE
Comment: NEGATIVE
Comment: NORMAL
Neisseria Gonorrhea: NEGATIVE

## 2020-06-19 NOTE — Telephone Encounter (Signed)
Transition Care Management Unsuccessful Follow-up Telephone Call  Date of discharge and from where:  06/17/2020 [facility]  Attempts:  2nd Attempt  Reason for unsuccessful TCM follow-up call:  Left voice message

## 2020-06-22 NOTE — Telephone Encounter (Signed)
Transition Care Management Unsuccessful Follow-up Telephone Call  Date of discharge and from where:  06/17/2020 - [facility]  Attempts:  3rd Attempt  Reason for unsuccessful TCM follow-up call:  Left voice message

## 2020-08-21 DIAGNOSIS — F411 Generalized anxiety disorder: Secondary | ICD-10-CM | POA: Insufficient documentation

## 2020-08-21 DIAGNOSIS — F17210 Nicotine dependence, cigarettes, uncomplicated: Secondary | ICD-10-CM | POA: Insufficient documentation

## 2020-11-25 DIAGNOSIS — R079 Chest pain, unspecified: Secondary | ICD-10-CM

## 2020-11-25 HISTORY — DX: Chest pain, unspecified: R07.9

## 2020-12-08 ENCOUNTER — Telehealth: Payer: Self-pay | Admitting: General Practice

## 2020-12-08 NOTE — Telephone Encounter (Signed)
Called patient per mychart request. Patient states she cannot find childcare for her appt on Thursday and wants to know if she can bring her daughter. Told patient that is fine, at the moment [facility] hasn't restricted children in the outpatient setting. Patient verbalized understanding.

## 2020-12-10 ENCOUNTER — Encounter: Payer: Self-pay | Admitting: Obstetrics and Gynecology

## 2020-12-10 ENCOUNTER — Other Ambulatory Visit: Payer: Self-pay

## 2020-12-10 ENCOUNTER — Ambulatory Visit (INDEPENDENT_AMBULATORY_CARE_PROVIDER_SITE_OTHER): Payer: Medicaid Other | Admitting: Obstetrics and Gynecology

## 2020-12-10 DIAGNOSIS — N3020 Other chronic cystitis without hematuria: Secondary | ICD-10-CM

## 2020-12-10 DIAGNOSIS — N920 Excessive and frequent menstruation with regular cycle: Secondary | ICD-10-CM | POA: Diagnosis not present

## 2020-12-10 MED ORDER — TRANEXAMIC ACID 650 MG PO TABS: 1300.0000 mg | ORAL_TABLET | Freq: Three times a day (TID) | ORAL | 2 refills | Status: DC

## 2020-12-10 MED ORDER — NITROFURANTOIN MONOHYD MACRO 100 MG PO CAPS: ORAL_CAPSULE | ORAL | 0 refills | Status: DC

## 2020-12-10 NOTE — Patient Instructions (Signed)
Vaginal Hysterectomy, Care After The following information offers guidance on how to care for yourself after your procedure. Your health care provider may also give you more specific instructions. If you have problems or questions, contact your health care provider. What can I expect after the procedure? After the procedure, it is common to have: Pain in the lower abdomen and vagina. Vaginal bleeding and discharge for up to 1 week. You will need to use a sanitary pad after this procedure. Difficulty having a bowel movement (constipation). Temporary problems emptying the bladder. Tiredness (fatigue). Poor appetite. Less interest in sex. Feelings of sadness or other emotions. If your ovaries were also removed, it is also common to have symptoms of menopause, such as hot flashes, night sweats, and lack of sleep (insomnia). Follow these instructions at home: Medicines  Take over-the-counter and prescription medicines only as told by your health care provider. Do not take aspirin or NSAIDs, such as ibuprofen. These medicines can cause bleeding. Ask your health care provider if the medicine prescribed to you: Requires you to avoid driving or using machinery. Can cause constipation. You may need to take these actions to prevent or treat constipation: Drink enough fluid to keep your urine pale yellow. Take over-the-counter or prescription medicines. Eat foods that are high in fiber, such as beans, whole grains, and fresh fruits and vegetables. Limit foods that are high in fat and processed sugars, such as fried or sweet foods. Activity  Rest as told by your health care provider. Return to your normal activities as told by your health care provider. Ask your health care provider what activities are safe for you Avoid sitting for a long time without moving. Get up to take short walks every 1-2 hours. This is important to improve blood flow and breathing. Ask for help if you feel weak or  unsteady. Try to have someone home with you for 1-2 weeks to help you with everyday chores. Do not lift anything that is heavier than 10 lb (4.5 kg), or the limit that you are told, until your health care provider says that it is safe. If you were given a sedative during the procedure, it can affect you for several hours. Do not drive or operate machinery until your health care provider says that it is safe. Lifestyle Do not use any products that contain nicotine or tobacco. These products include cigarettes, chewing tobacco, and vaping devices, such as e-cigarettes. These can delay healing after surgery. If you need help quitting, ask your health care provider. Do not drink alcohol until your health care provider approves. General instructions Do not douche, use tampons, or have sex for at least 6 weeks, or as told by your health care provider. If you struggle with physical or emotional changes after your procedure, speak with your health care provider or a therapist. The stitches inside your vagina will dissolve over time and do not need to be taken out. Do not take baths, swim, or use a hot tub until your health care provider approves. You may only be allowed to take showers for 2-3 weeks Wear compression stockings as told by your health care provider. These stockings help to prevent blood clots and reduce swelling in your legs. Keep all follow-up visits. This is important. Contact a health care provider if: Your pain medicine is not helping. You have a fever. You have nausea or vomiting that does not go away. You feel dizzy. You have blood, pus, or a bad-smelling discharge from your vagina   more than 1 week after the procedure. You continue to have trouble urinating 3-5 days after the procedure. Get help right away if: You have severe pain in your abdomen or back. You faint. You have heavy vaginal bleeding and blood clots, soaking through a sanitary pad in less than 1 hour. You have chest  pain or shortness of breath. You have pain, swelling, or redness in your leg. These symptoms may represent a serious problem that is an emergency. Do not wait to see if the symptoms will go away. Get medical help right away. Call your local emergency services (911 in the [place] Do not drive yourself to the hospital. Summary After the procedure, it is common to have pain, vaginal bleeding, constipation, temporary problems emptying your bladder, and feelings of sadness or other emotions. Take over-the-counter and prescription medicines only as told by your health care provider. Rest as told by your health care provider. Return to your normal activities as told by your health care provider. Contact a health care provider if your pain medicine is not helping, or you have a fever, dizziness, or trouble urinating several days after the procedure. Get help right away if you have severe pain in your abdomen or back, or if you faint, have heavy bleeding, or have chest pain or shortness of breath. This information is not intended to replace advice given to you by your health care provider. Make sure you discuss any questions you have with your health care provider. Document Revised: 08/30/2019 Document Reviewed: 08/30/2019 Elsevier Patient Education  2022 [employer]. Vaginal Hysterectomy A vaginal hysterectomy is a procedure to remove all or part of the uterus through a small incision in the vagina. In this procedure, your health care provider may remove your entire uterus, including the cervix. The cervix is the opening and bottom part of the uterus and is located between the vagina and the uterus. Sometimes, the ovaries and fallopian tubes are also removed. This surgery may be done to treat problems such as: Noncancerous growths in the uterus (uterine fibroids) that cause symptoms. A condition that causes the lining of the uterus to grow in other areas (endometriosis). Problems with pelvic  support. Cancer of the cervix, ovaries, uterus, or tissue that lines the uterus (endometrium). Excessive bleeding in the uterus. When removing your uterus, your health care provider may also remove the ovaries and the fallopian tubes. After this procedure, you will no longer be able to have a baby, and you will no longer have a menstrual period. Tell a health care provider about: Any allergies you have. All medicines you are taking, including vitamins, herbs, eye drops, creams, and over-the-counter medicines. Any problems you or family members have had with anesthetic medicines. Any blood disorders you have. Any surgeries you have had. Any medical conditions you have. Whether you are pregnant or may be pregnant. What are the risks? Generally, this is a safe procedure. However, problems may occur, including: Bleeding. Infection. Blood clots in the legs or lungs. Damage to nearby structures or organs. Pain during sex. Allergic reactions to medicines. What happens before the procedure? Staying hydrated Follow instructions from your health care provider about hydration, which may include: Up to 2 hours before the procedure - you may continue to drink clear liquids, such as water, clear fruit juice, black coffee, and plain tea.  Eating and drinking restrictions Follow instructions from your health care provider about eating and drinking, which may include: 8 hours before the procedure - stop eating heavy   meals or foods, such as meat, fried foods, or fatty foods. 6 hours before the procedure - stop eating light meals or foods, such as toast or cereal. 6 hours before the procedure - stop drinking milk or drinks that contain milk. 2 hours before the procedure - stop drinking clear liquids. Medicines Ask your health care provider about: Changing or stopping your regular medicines. This is especially important if you are taking diabetes medicines or blood thinners. Taking medicines such as  aspirin and ibuprofen. These medicines can thin your blood. Do not take these medicines unless your health care provider tells you to take them. Taking over-the-counter medicines, vitamins, herbs, and supplements. You may be asked to take a medicine to empty your colon (bowel preparation). General instructions If you were asked to do a bowel preparation before the procedure, follow instructions from your health care provider. This procedure can affect the way you feel about yourself. Talk with your health care provider about the physical and emotional changes hysterectomy may cause. Do not use any products that contain nicotine or tobacco for at least 4 weeks before the procedure. These products include cigarettes, e-cigarettes, and chewing tobacco. If you need help quitting, ask your health care provider. Plan to have a responsible adult take you home from the hospital or clinic. Plan to have a responsible adult care for you for the time you are told after you leave the hospital or clinic. This is important. Surgery safety Ask your health care provider: How your surgery site will be marked. What steps will be taken to help prevent infection. These may include: Removing hair at the surgery site. Washing skin with a germ-killing soap. Receiving antibiotic medicine. What happens during the procedure? An IV will be inserted into one of your veins. You will be given one or more of the following: A medicine to help you relax (sedative). A medicine to numb the area (local anesthetic). A medicine to make you fall asleep (general anesthetic). A medicine that is injected into your spine to numb the area below and slightly above the injection site (spinal anesthetic). A medicine that is injected into an area of your body to numb everything below the injection site (regional anesthetic). Your surgeon will make an incision in your vagina. Your surgeon will locate and remove all or part of your uterus.  Part or all of the uterus will be removed through the vagina. Your ovaries and fallopian tubes may be removed at the same time. The incision in your vagina will be closed with stitches (sutures) that dissolve over time. The procedure may vary among health care providers and hospitals. What happens after the procedure? Your blood pressure, heart rate, breathing rate, and blood oxygen level will be monitored until you leave the hospital or clinic. You will be encouraged to walk as soon as possible. You will also use a device or do breathing exercises to keep your lungs clear. You may have to wear compression stockings. These stockings help to prevent blood clots and reduce swelling in your legs. You will be given pain medicine as needed. You will need to wear a sanitary pad for vaginal discharge or bleeding. Summary A vaginal hysterectomy is a procedure to remove all or part of the uterus through the vagina. You may need a vaginal hysterectomy to treat a variety of abnormalities of the uterus. Plan to have a responsible adult take you home from the hospital or clinic. Plan to have a responsible adult care for you for   the time you are told after you leave the hospital or clinic. This is important. This information is not intended to replace advice given to you by your health care provider. Make sure you discuss any questions you have with your health care provider. Document Revised: 08/30/2019 Document Reviewed: 08/30/2019 Elsevier Patient Education  2022 Elsevier Inc.  

## 2020-12-10 NOTE — Progress Notes (Signed)
Ms Dorin presents to discuss hyst for her menorrhagia. Pt reports heavy painful cycles for over a yr now. Has tried several hormonal medications without relief. Cycles interfere with her ADL's. Reports painful intercourse as well  She desires definite therapy.  GYN U/S 6/22, normal with 159 vol.  H/O c section x 1  Currently see cardiology for SOB and PVC's  Pap smear UTD.  PE AF VSS  Chaperone present for exam.  Lungs clear Heart RRR Abd soft, BS GU Nl EGBUS, uterus small, mobile, + bladder tenderness, no masses  A/P Menorrhagia refractory to medications        Chronic cystitis  Discussed other Tx options with pt including ablation and IUD. Pt declined. Will placed on TXA to help with cycles prior to surgery. TVH with possible salpingectomy reviewed with pt. R/B/Post op care reviewed. Hyst papers signed today. Completed cardiology w/u. Macrobid for cystitis. Will schedule surgery. F/U with post op appt

## 2020-12-20 NOTE — H&P (Signed)
Donna Gordon is an 40 y.o. female with long standing H/O HMB. Has tried several hormonal medications without relief. GYN U/S normal, vol 159. Desires definite therapy.  H/O c section x 1.    Menstrual History: Menarche age: 41 No LMP recorded.    Past Medical History:  Diagnosis Date   Asthma    prn inhaler   Depression    Family history of adverse reaction to anesthesia    maternal grandmother has hx. of being hard to wake up post-op   GERD (gastroesophageal reflux disease)    Migraines    Uterine polyp 08/2017    Past Surgical History:  Procedure Laterality Date   CESAREAN SECTION N/A 12/02/2015   Procedure: CESAREAN SECTION;  Surgeon: Federico Flake, MD;  Location: [facility];  Service: Obstetrics;  Laterality: N/A;   CHOLECYSTECTOMY, LAPAROSCOPIC  10/07/2014   DILATATION & CURETTAGE/HYSTEROSCOPY WITH MYOSURE N/A 09/06/2017   Procedure: DILATATION & CURETTAGE/HYSTEROSCOPY;  Surgeon: Adam Phenix, MD;  Location: [facility];  Service: Gynecology;  Laterality: N/A;   MYOMECTOMY ABDOMINAL APPROACH  08/06/2007    Family History  Problem Relation Age of Onset   Cancer Paternal Aunt 36       pancreatic    Cancer Maternal Grandmother 41       breast   Anesthesia problems Maternal Grandmother        hard to wake up post-op   Thyroid cancer Mother     Social History:  reports that she has been smoking cigarettes. She has a 3.00 pack-year smoking history. She has never used smokeless tobacco. She reports that she does not drink alcohol and does not use drugs.  Allergies:  Allergies  Allergen Reactions   Other Hives    SWEET AND SOUR SAUCE    No medications prior to admission.    Review of Systems  Constitutional: Negative.   Respiratory: Negative.    Cardiovascular: Negative.   Gastrointestinal: Negative.   Genitourinary:  Positive for menstrual problem.   There were no vitals taken for this visit. Physical  Exam Constitutional:      Appearance: Normal appearance.  Cardiovascular:     Rate and Rhythm: Normal rate and regular rhythm.  Pulmonary:     Effort: Pulmonary effort is normal.     Breath sounds: Normal breath sounds.  Abdominal:     General: Bowel sounds are normal.     Palpations: Abdomen is soft.  Genitourinary:    Comments: Nl EGBUS, uterus < 8 weeks, mobile, no masses   No results found for this or any previous visit (from the past 24 hour(s)).  No results found.  Assessment/Plan: HBM  Desires definite therapy. TVH with possible salpingectomy. R/B/Post op care reviewed with pt. Pt has verbalized understanding and desires to proceed.  Donna Gordon 12/20/2020, 1:31 PM

## 2020-12-23 ENCOUNTER — Encounter (HOSPITAL_BASED_OUTPATIENT_CLINIC_OR_DEPARTMENT_OTHER): Payer: Self-pay | Admitting: Obstetrics and Gynecology

## 2020-12-23 ENCOUNTER — Other Ambulatory Visit: Payer: Self-pay

## 2020-12-23 DIAGNOSIS — Z01812 Encounter for preprocedural laboratory examination: Secondary | ICD-10-CM | POA: Diagnosis present

## 2020-12-23 DIAGNOSIS — N920 Excessive and frequent menstruation with regular cycle: Secondary | ICD-10-CM | POA: Diagnosis not present

## 2020-12-23 NOTE — Progress Notes (Addendum)
Spoke w/ via phone for pre-op interview---pt Lab needs dos----  urine preg             Lab results------lab appt 12-25-2020 915 am [facility] t & s COVID test -----patient states asymptomatic no test needed Arrive at -------1045 am 12-29-2020 NPO after MN NO Solid Food.  Clear liquids from MN until---945 am Med rec completed Medications to take morning of surgery -----albuterol inhaler prn/bring inhale rprn Diabetic medication -----n/a Patient instructed no nail polish to be worn day of surgery Patient instructed to bring photo id and insurance card day of surgery Patient aware to have Driver (ride ) / caregiver    for 24 hours after surgery  husband ryan will stay Patient Special Instructions -----pt given overnight stay instructions Pre-Op special Istructions -----no smoking 24 hours before surgery Patient verbalized understanding of instructions that were given at this phone interview. Patient denies shortness of breath, chest pain, fever, cough at this phone interview.   Anesthesia Review: cardiac clearance note dr Carolyne Fiscal badal 12-22-2020 chart for 12-29-2020 surgery  PCP: david holton np lov 08-27-2020 epic Cardiologist :lov dr  11-24-2020 chart/epic Chest x-ray : none recent EKG : 08-27-2020 [facility] chart Echo :12-22-2020 chart/care everywhere Holter monitor report 11-09-2020 chart/care everywhere Stress test:12-22-2020 chart-care everywhere Cardiac Cath : none Activity level: can climb flight of stairs without problems Sleep Study/ CPAP :none  Neurology lov dr Romie Levee chart/care everywhere  :

## 2020-12-23 NOTE — Progress Notes (Signed)
PLEASE WEAR A MASK OUT IN PUBLIC AND SOCIAL DISTANCE AND WASH YOUR HANDS FREQUENTLY. PLEASE ASK ALL YOUR CLOSE HOUSEHOLD CONTACT TO WEAR MASK OUT IN PUBLIC AND SOCIAL DISTANCE AND WASH HANDS FREQUENTLY ALSO.      Your procedure is scheduled on 12-29-2020  Report to [facility] AT  1045 A. M.   Call this number if you have problems the morning of surgery  :[phone].   OUR ADDRESS IS [address].  [facility] LOCATED IN [facility].  PLEASE BRING YOUR INSURANCE CARD AND PHOTO ID DAY OF SURGERY.  ONLY ONE PERSON ALLOWED IN FACILITY WAITING AREA.                                     REMEMBER:  DO NOT EAT FOOD, CANDY GUM OR MINTS  AFTER MIDNIGHT  THE NIGHT BEFORE YOUR SURGERY. YOU MAY HAVE CLEAR LIQUIDS FROM MIDNIGHT UNTIL 945 AM. NO CLEAR LIQUIDS AFTER 945 AM DAY OF SURGERY.   YOU MAY  BRUSH YOUR TEETH MORNING OF SURGERY AND RINSE YOUR MOUTH OUT, NO CHEWING GUM CANDY OR MINTS.    CLEAR LIQUID DIET   Foods Allowed                                                                     Foods Excluded  Coffee and tea, regular and decaf                             liquids that you cannot  Plain Jell-O any favor except red or purple                                           see through such as: Fruit ices (not with fruit pulp)                                     milk, soups, orange juice  Iced Popsicles                                    All solid food Carbonated beverages, regular and diet                                    Cranberry, grape and apple juices Sports drinks like Gatorade Lightly seasoned clear broth or consume(fat free) Sugar  Sample Menu Breakfast                                Lunch  Supper Cranberry juice                                           Jell-O                                     Grape juice                           Apple juice Coffee or tea                        Jell-O                                       Popsicle                                                Coffee or tea                        Coffee or tea  _____________________________________________________________________     TAKE THESE MEDICATIONS MORNING OF SURGERY WITH A SIP OF WATER:  NONE  ONE VISITOR IS ALLOWED IN WAITING ROOM ONLY DAY OF SURGERY.  YOU MAY HAVE ANOTHER PERSON SWITCH OUT WITH THE  1  VISITOR IN THE WAITING ROOM DAY OF SURGERY AND A MASK MUST BE WORN IN THE WAITING ROOM.    2 VISITORS  MAY VISIT IN THE EXTENDED RECOVERY ROOM UNTIL 800 PM ONLY 1 VISITOR AGE 47 AND OVER MAY SPEND THE NIGHT AND MUST BE IN EXTENDED RECOVERY ROOM NO LATER THAN 800 PM .   UP TO 2 CHILDREN AGE 20 TO 15 MAY ALSO VISIT IN EXTENDED RECOVERY ROOM ONLY UNTIL 800 PM AND MUST LEAVE BY 800 PM. ALL PERSONS VISITING IN EXTENDED RECOVERY ROOM MUST WEAR A MASK.                                    DO NOT WEAR JEWERLY, MAKE UP. DO NOT WEAR LOTIONS, POWDERS, PERFUMES OR NAIL POLISH ON YOUR FINGERNAILS. TOENAIL POLISH IS OK TO WEAR. DO NOT SHAVE FOR 48 HOURS PRIOR TO DAY OF SURGERY. MEN MAY SHAVE FACE AND NECK. CONTACTS, GLASSES, OR DENTURES MAY NOT BE WORN TO SURGERY.                                    [facility] IS NOT RESPONSIBLE  FOR ANY BELONGINGS.                                                                    [facility] Preparing for [facility] Before surgery, you can play an important role.  Because skin is not sterile, your skin needs to be as free of germs as possible.  You can reduce the number of germs on your skin by washing with CHG (chlorahexidine gluconate) soap before surgery.  CHG is an antiseptic cleaner which kills germs and bonds with the skin to continue killing germs even after washing. Please DO NOT use if you have an allergy to CHG or antibacterial soaps.  If your skin becomes reddened/irritated stop using the CHG and inform your nurse when you arrive at Short Stay. Do not shave  (including legs and underarms) for at least 48 hours prior to the first CHG shower.  You may shave your face/neck. Please follow these instructions carefully:  1.  Shower with CHG Soap the night before surgery and the  morning of Surgery.  2.  If you choose to wash your hair, wash your hair first as usual with your  normal  shampoo.  3.  After you shampoo, rinse your hair and body thoroughly to remove the  shampoo.                            4.  Use CHG as you would any other liquid soap.  You can apply chg directly  to the skin and wash                      Gently with a scrungie or clean washcloth.  5.  Apply the CHG Soap to your body ONLY FROM THE NECK DOWN.   Do not use on face/ open                           Wound or open sores. Avoid contact with eyes, ears mouth and genitals (private parts).                       Wash face,  Genitals (private parts) with your normal soap.             6.  Wash thoroughly, paying special attention to the area where your surgery  will be performed.  7.  Thoroughly rinse your body with warm water from the neck down.  8.  DO NOT shower/wash with your normal soap after using and rinsing off  the CHG Soap.                9.  Pat yourself dry with a clean towel.            10.  Wear clean pajamas.            11.  Place clean sheets on your bed the night of your first shower and do not  sleep with pets. Day of Surgery : Do not apply any lotions/deodorants the morning of surgery.  Please wear clean clothes to the hospital/surgery center.  IF YOU HAVE ANY SKIN IRRITATION OR PROBLEMS WITH THE SURGICAL SOAP, PLEASE GET A BAR OF GOLD DIAL SOAP AND SHOWER THE NIGHT BEFORE YOUR SURGERY AND THE MORNING OF YOUR SURGERY. PLEASE LET THE NURSE KNOW MORNING OF YOUR SURGERY IF YOU HAD ANY PROBLEMS WITH THE SURGICAL SOAP.  FAILURE TO FOLLOW THESE INSTRUCTIONS MAY RESULT IN THE CANCELLATION OF YOUR SURGERY PATIENT SIGNATURE_________________________________  NURSE  SIGNATURE__________________________________  ________________________________________________________________________  QUESTIONS CALL Robi Mitter PRE OP NURSE PHONE [phone].

## 2020-12-25 ENCOUNTER — Encounter (HOSPITAL_COMMUNITY)
Admission: RE | Admit: 2020-12-25 | Discharge: 2020-12-25 | Disposition: A | Discharge status: Home / Self Care | Payer: Medicaid Other | Source: Ambulatory Visit | Attending: Obstetrics and Gynecology | Admitting: Obstetrics and Gynecology

## 2020-12-25 ENCOUNTER — Telehealth: Payer: Self-pay

## 2020-12-25 ENCOUNTER — Other Ambulatory Visit: Payer: Self-pay

## 2020-12-25 DIAGNOSIS — Z01812 Encounter for preprocedural laboratory examination: Secondary | ICD-10-CM | POA: Diagnosis not present

## 2020-12-25 DIAGNOSIS — N920 Excessive and frequent menstruation with regular cycle: Secondary | ICD-10-CM | POA: Insufficient documentation

## 2020-12-25 LAB — BASIC METABOLIC PANEL
Anion gap: 7 (ref 5–15)
BUN: 12 mg/dL (ref 6–20)
CO2: 23 mmol/L (ref 22–32)
Calcium: 9 mg/dL (ref 8.9–10.3)
Chloride: 107 mmol/L (ref 98–111)
Creatinine, Ser: 0.8 mg/dL (ref 0.44–1.00)
GFR, Estimated: >60 mL/min (ref >60)
Glucose, Bld: 73 mg/dL (ref 70–99)
Potassium: 3.8 mmol/L (ref 3.5–5.1)
Sodium: 137 mmol/L (ref 135–145)

## 2020-12-25 LAB — CBC
HCT: 43.7 % (ref 36.0–46.0)
Hemoglobin: 14.4 g/dL (ref 12.0–15.0)
MCH: 30.1 pg (ref 26.0–34.0)
MCHC: 33 g/dL (ref 30.0–36.0)
MCV: 91.2 fL (ref 80.0–100.0)
Platelets: 276 10*3/uL (ref 150–400)
RBC: 4.79 MIL/uL (ref 3.87–5.11)
RDW: 13.1 % (ref 11.5–15.5)
WBC: 7.3 10*3/uL (ref 4.0–10.5)
nRBC: 0 % (ref 0.0–0.2)

## 2020-12-25 NOTE — Telephone Encounter (Signed)
Called patient, notified her of updated surgery time, patient asked for new letter in mychart.

## 2020-12-28 NOTE — Anesthesia Preprocedure Evaluation (Addendum)
Anesthesia Evaluation  Patient identified by MRN, date of birth, ID band Patient awake    Reviewed: Allergy & Precautions, NPO status , Patient's Chart, lab work & pertinent test results  History of Anesthesia Complications (+) Family history of anesthesia reaction and history of anesthetic complications (prolonged emergence grandmother)  Airway Mallampati: I  TM Distance: >3 FB Neck ROM: Full    Dental no notable dental hx. (+) Teeth Intact, Dental Advisory Given   Pulmonary asthma , Current Smoker,  Current smoker-    Pulmonary exam normal breath sounds clear to auscultation       Cardiovascular negative cardio ROS Normal cardiovascular exam Rhythm:Regular Rate:Normal     Neuro/Psych  Headaches, PSYCHIATRIC DISORDERS Anxiety Depression    GI/Hepatic negative GI ROS, Neg liver ROS,   Endo/Other  negative endocrine ROS  Renal/GU negative Renal ROS  Female GU complaint     Musculoskeletal negative musculoskeletal ROS (+)   Abdominal   Peds  Hematology negative hematology ROS (+) hct 43.7, plt 276   Anesthesia Other Findings   Reproductive/Obstetrics negative OB ROS                            Anesthesia Physical Anesthesia Plan  ASA: 2  Anesthesia Plan: General   Post-op Pain Management: Tylenol PO (pre-op), Toradol IV (intra-op) and Dilaudid IV   Induction: Intravenous  PONV Risk Score and Plan: 3 and Ondansetron, Dexamethasone, Midazolam, Scopolamine patch - Pre-op and Treatment may vary due to age or medical condition  Airway Management Planned: Oral ETT  Additional Equipment: None  Intra-op Plan:   Post-operative Plan: Extubation in OR  Informed Consent: I have reviewed the patients History and Physical, chart, labs and discussed the procedure including the risks, benefits and alternatives for the proposed anesthesia with the patient or authorized representative who has  indicated his/her understanding and acceptance.     Dental advisory given  Plan Discussed with: CRNA  Anesthesia Plan Comments:        Anesthesia Quick Evaluation

## 2020-12-29 ENCOUNTER — Encounter (HOSPITAL_BASED_OUTPATIENT_CLINIC_OR_DEPARTMENT_OTHER): Payer: Self-pay | Admitting: Obstetrics and Gynecology

## 2020-12-29 ENCOUNTER — Observation Stay (HOSPITAL_BASED_OUTPATIENT_CLINIC_OR_DEPARTMENT_OTHER)
Admission: RE | Admit: 2020-12-29 | Discharge: 2020-12-30 | Disposition: A | Discharge status: Home / Self Care | Payer: Medicaid Other | Source: Ambulatory Visit | Attending: Obstetrics and Gynecology | Admitting: Obstetrics and Gynecology

## 2020-12-29 ENCOUNTER — Encounter (HOSPITAL_BASED_OUTPATIENT_CLINIC_OR_DEPARTMENT_OTHER)
Admission: RE | Disposition: A | Discharge status: Home / Self Care | Payer: Self-pay | Source: Ambulatory Visit | Attending: Obstetrics and Gynecology

## 2020-12-29 ENCOUNTER — Observation Stay (HOSPITAL_BASED_OUTPATIENT_CLINIC_OR_DEPARTMENT_OTHER): Payer: Medicaid Other | Admitting: Anesthesiology

## 2020-12-29 DIAGNOSIS — Z8742 Personal history of other diseases of the female genital tract: Secondary | ICD-10-CM | POA: Diagnosis not present

## 2020-12-29 DIAGNOSIS — F17210 Nicotine dependence, cigarettes, uncomplicated: Secondary | ICD-10-CM | POA: Insufficient documentation

## 2020-12-29 DIAGNOSIS — Z9889 Other specified postprocedural states: Secondary | ICD-10-CM

## 2020-12-29 DIAGNOSIS — Z803 Family history of malignant neoplasm of breast: Secondary | ICD-10-CM | POA: Insufficient documentation

## 2020-12-29 DIAGNOSIS — N920 Excessive and frequent menstruation with regular cycle: Secondary | ICD-10-CM | POA: Diagnosis not present

## 2020-12-29 DIAGNOSIS — Z808 Family history of malignant neoplasm of other organs or systems: Secondary | ICD-10-CM | POA: Insufficient documentation

## 2020-12-29 DIAGNOSIS — J45909 Unspecified asthma, uncomplicated: Secondary | ICD-10-CM | POA: Diagnosis not present

## 2020-12-29 DIAGNOSIS — Z01818 Encounter for other preprocedural examination: Secondary | ICD-10-CM

## 2020-12-29 HISTORY — DX: Disorder of the skin and subcutaneous tissue, unspecified: L98.9

## 2020-12-29 HISTORY — DX: Anesthesia of skin: R20.0

## 2020-12-29 HISTORY — PX: VAGINAL HYSTERECTOMY: SHX2639

## 2020-12-29 HISTORY — DX: Sciatica, unspecified side: M54.30

## 2020-12-29 LAB — POCT PREGNANCY, URINE: Preg Test, Ur: NEGATIVE

## 2020-12-29 LAB — TYPE AND SCREEN
ABO/RH(D): O POS
Antibody Screen: NEGATIVE

## 2020-12-29 SURGERY — HYSTERECTOMY, VAGINAL
Anesthesia: General | Site: Uterus | Laterality: Bilateral

## 2020-12-29 MED ORDER — LIDOCAINE 2% (20 MG/ML) 5 ML SYRINGE
INTRAMUSCULAR | Status: DC | PRN
Start: 2020-12-29 — End: 2020-12-29
  Administered 2020-12-29: 80 mg via INTRAVENOUS
  Administered 2020-12-29: 60 mg via INTRAVENOUS

## 2020-12-29 MED ORDER — SUGAMMADEX SODIUM 200 MG/2ML IV SOLN
INTRAVENOUS | Status: DC | PRN
Start: 2020-12-29 — End: 2020-12-29
  Administered 2020-12-29: 200 mg via INTRAVENOUS

## 2020-12-29 MED ORDER — ZOLPIDEM TARTRATE 5 MG PO TABS: 5.0000 mg | ORAL_TABLET | Freq: Every evening | ORAL | Status: DC | PRN

## 2020-12-29 MED ORDER — PROPOFOL 10 MG/ML IV BOLUS
INTRAVENOUS | Status: DC | PRN
  Administered 2020-12-29: 200 mg via INTRAVENOUS

## 2020-12-29 MED ORDER — 0.9 % SODIUM CHLORIDE (POUR BTL) OPTIME
TOPICAL | Status: DC | PRN
  Administered 2020-12-29: 09:00:00 500 mL

## 2020-12-29 MED ORDER — DEXAMETHASONE SODIUM PHOSPHATE 10 MG/ML IJ SOLN
INTRAMUSCULAR | Status: DC | PRN
  Administered 2020-12-29: 10 mg via INTRAVENOUS

## 2020-12-29 MED ORDER — ONDANSETRON HCL 4 MG/2ML IJ SOLN: 4.0000 mg | Freq: Four times a day (QID) | INTRAMUSCULAR | Status: DC | PRN

## 2020-12-29 MED ORDER — IBUPROFEN 800 MG PO TABS: 800.0000 mg | ORAL_TABLET | Freq: Three times a day (TID) | ORAL | Status: DC

## 2020-12-29 MED ORDER — ACETAMINOPHEN 500 MG PO TABS
ORAL_TABLET | ORAL | Status: AC
  Filled 2020-12-29: qty 2

## 2020-12-29 MED ORDER — DIPHENHYDRAMINE HCL 50 MG/ML IJ SOLN
INTRAMUSCULAR | Status: AC
  Filled 2020-12-29: qty 1

## 2020-12-29 MED ORDER — ACETAMINOPHEN 500 MG PO TABS: 1000.0000 mg | ORAL_TABLET | ORAL | Status: DC

## 2020-12-29 MED ORDER — FUROSEMIDE 10 MG/ML IJ SOLN
10.0000 mg | Freq: Once | INTRAMUSCULAR | Status: AC
  Administered 2020-12-29: 14:00:00 10 mg via INTRAVENOUS

## 2020-12-29 MED ORDER — FENTANYL CITRATE (PF) 250 MCG/5ML IJ SOLN
INTRAMUSCULAR | Status: AC
  Filled 2020-12-29: qty 5

## 2020-12-29 MED ORDER — PHENYLEPHRINE 40 MCG/ML (10ML) SYRINGE FOR IV PUSH (FOR BLOOD PRESSURE SUPPORT)
PREFILLED_SYRINGE | INTRAVENOUS | Status: AC
  Filled 2020-12-29: qty 20

## 2020-12-29 MED ORDER — HYDROMORPHONE HCL 1 MG/ML IJ SOLN: 1.0000 mg | INTRAMUSCULAR | Status: DC | PRN

## 2020-12-29 MED ORDER — CEFAZOLIN SODIUM-DEXTROSE 2-4 GM/100ML-% IV SOLN
2.0000 g | INTRAVENOUS | Status: AC
  Administered 2020-12-29: 09:00:00 2 g via INTRAVENOUS

## 2020-12-29 MED ORDER — POVIDONE-IODINE 10 % EX SWAB: 2.0000 "application " | Freq: Once | CUTANEOUS | Status: DC

## 2020-12-29 MED ORDER — MIDAZOLAM HCL 5 MG/5ML IJ SOLN
INTRAMUSCULAR | Status: DC | PRN
  Administered 2020-12-29: 2 mg via INTRAVENOUS

## 2020-12-29 MED ORDER — ROCURONIUM BROMIDE 10 MG/ML (PF) SYRINGE
PREFILLED_SYRINGE | INTRAVENOUS | Status: AC
  Filled 2020-12-29: qty 10

## 2020-12-29 MED ORDER — ONDANSETRON HCL 4 MG/2ML IJ SOLN
INTRAMUSCULAR | Status: AC
  Filled 2020-12-29: qty 2

## 2020-12-29 MED ORDER — DEXMEDETOMIDINE (PRECEDEX) IN NS 20 MCG/5ML (4 MCG/ML) IV SYRINGE
PREFILLED_SYRINGE | INTRAVENOUS | Status: DC | PRN
  Administered 2020-12-29: 8 ug via INTRAVENOUS

## 2020-12-29 MED ORDER — DEXAMETHASONE SODIUM PHOSPHATE 10 MG/ML IJ SOLN
INTRAMUSCULAR | Status: AC
  Filled 2020-12-29: qty 1

## 2020-12-29 MED ORDER — KETOROLAC TROMETHAMINE 30 MG/ML IJ SOLN
INTRAMUSCULAR | Status: AC
  Filled 2020-12-29: qty 1

## 2020-12-29 MED ORDER — LACTATED RINGERS IV SOLN: INTRAVENOUS | Status: DC

## 2020-12-29 MED ORDER — KETOROLAC TROMETHAMINE 30 MG/ML IJ SOLN
INTRAMUSCULAR | Status: AC
  Filled 2020-12-29: qty 2

## 2020-12-29 MED ORDER — MEPERIDINE HCL 25 MG/ML IJ SOLN: 6.2500 mg | INTRAMUSCULAR | Status: DC | PRN

## 2020-12-29 MED ORDER — KETOROLAC TROMETHAMINE 30 MG/ML IJ SOLN
INTRAMUSCULAR | Status: DC | PRN
  Administered 2020-12-29: 30 mg via INTRAVENOUS

## 2020-12-29 MED ORDER — LIDOCAINE 2% (20 MG/ML) 5 ML SYRINGE
INTRAMUSCULAR | Status: AC
  Filled 2020-12-29: qty 5

## 2020-12-29 MED ORDER — SIMETHICONE 80 MG PO CHEW: 80.0000 mg | CHEWABLE_TABLET | Freq: Four times a day (QID) | ORAL | Status: DC | PRN

## 2020-12-29 MED ORDER — FUROSEMIDE 10 MG/ML IJ SOLN
INTRAMUSCULAR | Status: AC
  Filled 2020-12-29: qty 4

## 2020-12-29 MED ORDER — NICOTINE 14 MG/24HR TD PT24
14.0000 mg | MEDICATED_PATCH | Freq: Every day | TRANSDERMAL | Status: DC
  Administered 2020-12-29: 16:00:00 14 mg via TRANSDERMAL
  Filled 2020-12-29: qty 1

## 2020-12-29 MED ORDER — PROPOFOL 10 MG/ML IV BOLUS
INTRAVENOUS | Status: AC
  Filled 2020-12-29: qty 20

## 2020-12-29 MED ORDER — ONDANSETRON HCL 4 MG PO TABS: 4.0000 mg | ORAL_TABLET | Freq: Four times a day (QID) | ORAL | Status: DC | PRN

## 2020-12-29 MED ORDER — OXYCODONE-ACETAMINOPHEN 5-325 MG PO TABS
ORAL_TABLET | ORAL | Status: AC
  Filled 2020-12-29: qty 1

## 2020-12-29 MED ORDER — FENTANYL CITRATE (PF) 100 MCG/2ML IJ SOLN
INTRAMUSCULAR | Status: DC | PRN
  Administered 2020-12-29 (×5): 50 ug via INTRAVENOUS

## 2020-12-29 MED ORDER — MORPHINE SULFATE (PF) 4 MG/ML IV SOLN: 2.0000 mg | INTRAVENOUS | Status: DC | PRN

## 2020-12-29 MED ORDER — KETOROLAC TROMETHAMINE 15 MG/ML IJ SOLN: 15.0000 mg | INTRAMUSCULAR | Status: AC

## 2020-12-29 MED ORDER — OXYCODONE-ACETAMINOPHEN 5-325 MG PO TABS: 2.0000 | ORAL_TABLET | ORAL | Status: DC | PRN

## 2020-12-29 MED ORDER — ONDANSETRON HCL 4 MG/2ML IJ SOLN
INTRAMUSCULAR | Status: DC | PRN
  Administered 2020-12-29: 4 mg via INTRAVENOUS

## 2020-12-29 MED ORDER — KETOROLAC TROMETHAMINE 30 MG/ML IJ SOLN: 30.0000 mg | Freq: Once | INTRAMUSCULAR | Status: AC | PRN

## 2020-12-29 MED ORDER — HYDROMORPHONE HCL 2 MG/ML IJ SOLN
INTRAMUSCULAR | Status: AC
  Filled 2020-12-29: qty 1

## 2020-12-29 MED ORDER — OXYCODONE HCL 5 MG PO TABS
ORAL_TABLET | ORAL | Status: AC
  Filled 2020-12-29: qty 1

## 2020-12-29 MED ORDER — AMISULPRIDE (ANTIEMETIC) 5 MG/2ML IV SOLN: 10.0000 mg | Freq: Once | INTRAVENOUS | Status: DC | PRN

## 2020-12-29 MED ORDER — SCOPOLAMINE 1 MG/3DAYS TD PT72
MEDICATED_PATCH | TRANSDERMAL | Status: AC
  Filled 2020-12-29: qty 1

## 2020-12-29 MED ORDER — DOCUSATE SODIUM 100 MG PO CAPS
ORAL_CAPSULE | ORAL | Status: AC
  Filled 2020-12-29: qty 1

## 2020-12-29 MED ORDER — ACETAMINOPHEN 500 MG PO TABS
1000.0000 mg | ORAL_TABLET | Freq: Once | ORAL | Status: AC
  Administered 2020-12-29: 07:00:00 1000 mg via ORAL

## 2020-12-29 MED ORDER — SODIUM CHLORIDE 0.9 % IV BOLUS
500.0000 mL | Freq: Once | INTRAVENOUS | Status: AC
  Administered 2020-12-29: 14:00:00 500 mL via INTRAVENOUS

## 2020-12-29 MED ORDER — HYDROMORPHONE HCL 1 MG/ML IJ SOLN: 0.2500 mg | INTRAMUSCULAR | Status: DC | PRN

## 2020-12-29 MED ORDER — OXYCODONE-ACETAMINOPHEN 5-325 MG PO TABS
1.0000 | ORAL_TABLET | ORAL | Status: DC | PRN
  Administered 2020-12-29 – 2020-12-30 (×3): 1 via ORAL

## 2020-12-29 MED ORDER — OXYCODONE HCL 5 MG/5ML PO SOLN: 5.0000 mg | Freq: Once | ORAL | Status: AC | PRN

## 2020-12-29 MED ORDER — PROMETHAZINE HCL 25 MG/ML IJ SOLN: 6.2500 mg | INTRAMUSCULAR | Status: DC | PRN

## 2020-12-29 MED ORDER — OXYCODONE HCL 5 MG PO TABS
5.0000 mg | ORAL_TABLET | Freq: Once | ORAL | Status: AC | PRN
  Administered 2020-12-29: 13:00:00 5 mg via ORAL

## 2020-12-29 MED ORDER — KETOROLAC TROMETHAMINE 30 MG/ML IJ SOLN
30.0000 mg | Freq: Four times a day (QID) | INTRAMUSCULAR | Status: DC
  Administered 2020-12-29 – 2020-12-30 (×3): 30 mg via INTRAVENOUS

## 2020-12-29 MED ORDER — DIPHENHYDRAMINE HCL 50 MG/ML IJ SOLN
25.0000 mg | Freq: Four times a day (QID) | INTRAMUSCULAR | Status: DC | PRN
  Administered 2020-12-29: 12:00:00 25 mg via INTRAVENOUS

## 2020-12-29 MED ORDER — HYDROMORPHONE HCL 1 MG/ML IJ SOLN
INTRAMUSCULAR | Status: DC | PRN
  Administered 2020-12-29: .5 mg via INTRAVENOUS
  Administered 2020-12-29: 1 mg via INTRAVENOUS
  Administered 2020-12-29: .5 mg via INTRAVENOUS

## 2020-12-29 MED ORDER — PHENYLEPHRINE 40 MCG/ML (10ML) SYRINGE FOR IV PUSH (FOR BLOOD PRESSURE SUPPORT)
PREFILLED_SYRINGE | INTRAVENOUS | Status: DC | PRN
  Administered 2020-12-29: 80 ug via INTRAVENOUS
  Administered 2020-12-29: 40 ug via INTRAVENOUS

## 2020-12-29 MED ORDER — MIDAZOLAM HCL 2 MG/2ML IJ SOLN
INTRAMUSCULAR | Status: AC
  Filled 2020-12-29: qty 2

## 2020-12-29 MED ORDER — SOD CITRATE-CITRIC ACID 500-334 MG/5ML PO SOLN: 30.0000 mL | ORAL | Status: AC

## 2020-12-29 MED ORDER — CEFAZOLIN SODIUM-DEXTROSE 2-4 GM/100ML-% IV SOLN
INTRAVENOUS | Status: AC
  Filled 2020-12-29: qty 100

## 2020-12-29 MED ORDER — DOCUSATE SODIUM 100 MG PO CAPS
100.0000 mg | ORAL_CAPSULE | Freq: Two times a day (BID) | ORAL | Status: DC
  Administered 2020-12-29 (×2): 100 mg via ORAL

## 2020-12-29 MED ORDER — ROCURONIUM BROMIDE 10 MG/ML (PF) SYRINGE
PREFILLED_SYRINGE | INTRAVENOUS | Status: DC | PRN
  Administered 2020-12-29: 70 mg via INTRAVENOUS

## 2020-12-29 MED ORDER — SCOPOLAMINE 1 MG/3DAYS TD PT72
1.0000 | MEDICATED_PATCH | TRANSDERMAL | Status: DC
  Administered 2020-12-29: 07:00:00 1.5 mg via TRANSDERMAL

## 2020-12-29 SURGICAL SUPPLY — 23 items
BLADE SURG 10 STRL SS (BLADE) ×4 IMPLANT
CANISTER SUCT 1200ML W/VALVE (MISCELLANEOUS) ×4 IMPLANT
CNTNR URN SCR LID CUP LEK RST (MISCELLANEOUS) IMPLANT
CONT SPEC 4OZ STRL OR WHT (MISCELLANEOUS)
GAUZE 4X4 16PLY ~~LOC~~+RFID DBL (SPONGE) ×8 IMPLANT
GLOVE SURG ENC MOIS LTX SZ7.5 (GLOVE) ×4 IMPLANT
GLOVE SURG ENC MOIS LTX SZ8 (GLOVE) ×4 IMPLANT
GLOVE SURG UNDER POLY LF SZ6.5 (GLOVE) ×4 IMPLANT
GLOVE SURG UNDER POLY LF SZ7 (GLOVE) ×8 IMPLANT
GOWN STRL REUS W/TWL LRG LVL3 (GOWN DISPOSABLE) ×12 IMPLANT
GOWN STRL REUS W/TWL XL LVL3 (GOWN DISPOSABLE) ×4 IMPLANT
HIBICLENS CHG 4% 4OZ BTL (MISCELLANEOUS) ×4 IMPLANT
KIT TURNOVER CYSTO (KITS) ×4 IMPLANT
NS IRRIG 1000ML POUR BTL (IV SOLUTION) ×4 IMPLANT
PACK VAGINAL WOMENS (CUSTOM PROCEDURE TRAY) ×4 IMPLANT
PAD OB MATERNITY 4.3X12.25 (PERSONAL CARE ITEMS) ×4 IMPLANT
SUT VIC AB 2-0 CT1 18 (SUTURE) ×4 IMPLANT
SUT VIC AB 2-0 CT1 27 (SUTURE) ×3
SUT VIC AB 2-0 CT1 TAPERPNT 27 (SUTURE) ×2 IMPLANT
SUT VIC AB PLUS 45CM 1-MO-4 (SUTURE) ×10 IMPLANT
SUT VICRYL 1 TIES 12X18 (SUTURE) ×4 IMPLANT
TOWEL OR 17X26 10 PK STRL BLUE (TOWEL DISPOSABLE) ×8 IMPLANT
TRAY FOLEY W/BAG SLVR 14FR LF (SET/KITS/TRAYS/PACK) ×4 IMPLANT

## 2020-12-29 NOTE — Progress Notes (Signed)
Received pt from PACU, report given during recovery pt had generalized itching with scratching. Upon assessment, pt had scratch marks to back and small area of bleeding/abrasion to R shin from scratching. No visual rash apparent, pt feels may have been reaction from receiving IV dilaudid in OR. Dr. Alysia Penna notified, orders for benadryl obtained; dilaudid added to pt allergy list. Will continue to monitor.

## 2020-12-29 NOTE — Interval H&P Note (Signed)
History and Physical Interval Note:  12/29/2020 8:12 AM  Donna Gordon  has presented today for surgery, with the diagnosis of menorrhagia.  The various methods of treatment have been discussed with the patient and family. After consideration of risks, benefits and other options for treatment, the patient has consented to  Procedure(s): HYSTERECTOMY VAGINAL WITH POSSIBLE SALPINGECTOMY (Bilateral) as a surgical intervention.  The patient's history has been reviewed, patient examined, no change in status, stable for surgery.  I have reviewed the patient's chart and labs.  Questions were answered to the patient's satisfaction.     [name]

## 2020-12-29 NOTE — Anesthesia Procedure Notes (Signed)
Procedure Name: Intubation Date/Time: 12/29/2020 8:55 AM Performed by: Rogers Blocker, CRNA Pre-anesthesia Checklist: Patient identified, Emergency Drugs available, Suction available and Patient being monitored Patient Re-evaluated:Patient Re-evaluated prior to induction Oxygen Delivery Method: Circle System Utilized Preoxygenation: Pre-oxygenation with 100% oxygen Induction Type: IV induction Ventilation: Mask ventilation without difficulty Laryngoscope Size: Mac and 3 Grade View: Grade I Tube type: Oral Tube size: 7.0 mm Number of attempts: 1 Airway Equipment and Method: Stylet and Bite block Placement Confirmation: ETT inserted through vocal cords under direct vision, positive ETCO2 and breath sounds checked- equal and bilateral Secured at: 22 cm Tube secured with: Tape Dental Injury: Teeth and Oropharynx as per pre-operative assessment

## 2020-12-29 NOTE — Progress Notes (Signed)
Pt has order to d/c foley catheter at 1500; however, pt remains with only 50 ml urine output in foley catheter despite LR @ 100 ml/hr and PO intake. PACU report of 25 ml UOP during OR case. Abdomen soft, VSS with mild hypotension that pt states is baseline at times. MD notified, orders obtained for IV bolus followed by IV lasix. Also requested nicotine patch per pt request. Will continue to monitor.

## 2020-12-29 NOTE — Transfer of Care (Signed)
Immediate Anesthesia Transfer of Care Note  Patient: Youth worker  Procedure(s) Performed: HYSTERECTOMY VAGINAL WITH LEFT SALPINGECTOMY (Bilateral: Uterus)  Patient Location: PACU  Anesthesia Type:General  Level of Consciousness: awake, alert , oriented and patient cooperative  Airway & Oxygen Therapy: Patient spontaneously breathing  Post-op Assessment: Report given to RN and Post -op Vital signs reviewed and stable  Post vital signs: Reviewed and stable  Last Vitals:  Vitals Value Taken Time  BP 113/67 12/29/20 1007  Temp    Pulse 78 12/29/20 1011  Resp 18 12/29/20 1011  SpO2 96 % 12/29/20 1011  Vitals shown include unvalidated device data.  Last Pain:  Vitals:   12/29/20 0701  TempSrc: Oral  PainSc: 0-No pain      Patients Stated Pain Goal: 3 (12/29/20 0701)  Complications: No notable events documented.

## 2020-12-29 NOTE — Anesthesia Postprocedure Evaluation (Signed)
Anesthesia Post Note  Patient: Youth worker  Procedure(s) Performed: HYSTERECTOMY VAGINAL WITH LEFT SALPINGECTOMY (Bilateral: Uterus)     Patient location during evaluation: PACU Anesthesia Type: General Level of consciousness: awake and alert, oriented and patient cooperative Pain management: pain level controlled Vital Signs Assessment: post-procedure vital signs reviewed and stable Respiratory status: spontaneous breathing, nonlabored ventilation and respiratory function stable Cardiovascular status: blood pressure returned to baseline and stable Postop Assessment: no apparent nausea or vomiting Anesthetic complications: no   No notable events documented.  Last Vitals:  Vitals:   12/29/20 1008 12/29/20 1015  BP: 113/67 104/64  Pulse: 87 80  Resp: 18 19  Temp:    SpO2: 96% 96%    Last Pain:  Vitals:   12/29/20 0701  TempSrc: Oral  PainSc: 0-No pain                 Lannie Fields

## 2020-12-29 NOTE — Op Note (Signed)
Donna Gordon PROCEDURE DATE: 12/29/2020  PREOPERATIVE DIAGNOSIS:   Menorrhagia POSTOPERATIVE DIAGNOSIS:  Menorrhagia SURGEON:   Nettie Elm, M.D. ASSISTANT: Frederica Bing,  M.D.  An experienced assistant was required given the standard of surgical care given the complexity of the case.  This assistant was needed for exposure, dissection, suctioning, retraction, and for overall help during the procedure.   OPERATION:  Total Vaginal hysterectomy and left salpingectomy  ANESTHESIA:  General endotracheal.  INDICATIONS: The patient is a 40 y.o. G2P1011 with history menorrhagia. The patient made a decision to undergo definite surgical treatment. On the preoperative visit, the risks, benefits, indications, and alternatives of the procedure were reviewed with the patient.  On the day of surgery, the risks of surgery were again discussed with the patient including but not limited to: bleeding which may require transfusion or reoperation; infection which may require antibiotics; injury to bowel, bladder, ureters or other surrounding organs; need for additional procedures; thromboembolic phenomenon, incisional problems and other postoperative/anesthesia complications. Written informed consent was obtained.    OPERATIVE FINDINGS: A 8 week size uterus with normal tubes and ovaries bilaterally.  ESTIMATED BLOOD LOSS: 50 ml FLUIDS:  As recorded URINE OUTPUT:  As recorded SPECIMENS:  Uterus and cervix  and left tube sent to pathology COMPLICATIONS:  None immediate.  DESCRIPTION OF PROCEDURE:  The patient received intravenous antibiotics and had sequential compression devices applied to her lower extremities while in the preoperative area.  She was then taken to the operating room where general anesthesia was administered and was found to be adequate.  She was placed in the dorsal lithotomy position, and was prepped and draped in a sterile manner.  A Foley catheter was inserted into her bladder and  attached to constant drainage. After an adequate timeout was performed, attention was turned to her pelvis.  A weighted speculum was then placed in the vagina, posterior lip of the cervix were grasped bilaterally with tenaculum. The posterior cul de sac was then entered sharply without difficult. Long bill weighted speculum was placed. The anterior lip of the cervix was grasped with tenaculum. The anterior mucosa of the cervix was sharply circumscribed. The bladder was dissected off the pubocervical fascia anteriorly without complication.  The anterior cul-de-sac was then entered sharply without difficulty and a retractor was placed.  The same procedure was performed posteriorly and the posterior cul-de-sac was entered sharply without difficulty.  Zeplin clamps were then used to clamp the uterosacral ligaments on either side.  They were then cut and sutured ligated with 0 Vicryl.  Of note, all sutures used in this case were 0 Vicryl unless otherwise noted.   The cardinal ligaments were then clamped, cut and ligated. The uterine vessels and broad ligaments were then serially clamped with the Heaney clamps, cut, and suture ligated on both sides.  Excellent hemostasis was noted at this point.  The uterus was then morcellated using a bivalve technique to allow for better exposure. The final pedicle involving the uteroovarian was clamped, cut and ligated with a free tie and in a Bonney fashion. This was repeat on the opposite side, The specimen was then passed off for pathology.   After completion of the hysterectomy, all pedicles from the uterosacral ligament to the cornua were examined hemostasis was confirmed. The left fallopian tube was identified and grasped with a Babcock clamped. Cross clamped, cut, and ligated with a free tie. I was unable to visualize the right tube. The peritoneum was the closed in a purse string fashion  with 2/0 Vicryl.  The vaginal cuff was then closed with  2/0 Vicryl.  All instruments were  then removed from the pelvis. The patient tolerated the procedure well.  All instruments, needles, and sponge counts were correct x 2. The patient was taken to the recovery room in stable condition.    Nettie Elm, MD, FACOG Attending Obstetrician & Gynecologist Faculty [facility], [facility]

## 2020-12-30 ENCOUNTER — Encounter (HOSPITAL_BASED_OUTPATIENT_CLINIC_OR_DEPARTMENT_OTHER): Payer: Self-pay | Admitting: Obstetrics and Gynecology

## 2020-12-30 DIAGNOSIS — N920 Excessive and frequent menstruation with regular cycle: Secondary | ICD-10-CM | POA: Diagnosis not present

## 2020-12-30 LAB — CBC
HCT: 35.7 % — ABNORMAL LOW (ref 36.0–46.0)
Hemoglobin: 11.9 g/dL — ABNORMAL LOW (ref 12.0–15.0)
MCH: 29.9 pg (ref 26.0–34.0)
MCHC: 33.3 g/dL (ref 30.0–36.0)
MCV: 89.7 fL (ref 80.0–100.0)
Platelets: 232 10*3/uL (ref 150–400)
RBC: 3.98 MIL/uL (ref 3.87–5.11)
RDW: 12.9 % (ref 11.5–15.5)
WBC: 12.6 10*3/uL — ABNORMAL HIGH (ref 4.0–10.5)
nRBC: 0 % (ref 0.0–0.2)

## 2020-12-30 LAB — BASIC METABOLIC PANEL
Anion gap: 6 (ref 5–15)
BUN: 17 mg/dL (ref 6–20)
CO2: 23 mmol/L (ref 22–32)
Calcium: 8.7 mg/dL — ABNORMAL LOW (ref 8.9–10.3)
Chloride: 109 mmol/L (ref 98–111)
Creatinine, Ser: 0.73 mg/dL (ref 0.44–1.00)
GFR, Estimated: >60 mL/min (ref >60)
Glucose, Bld: 130 mg/dL — ABNORMAL HIGH (ref 70–99)
Potassium: 3.8 mmol/L (ref 3.5–5.1)
Sodium: 138 mmol/L (ref 135–145)

## 2020-12-30 MED ORDER — OXYCODONE-ACETAMINOPHEN 5-325 MG PO TABS: 1.0000 | ORAL_TABLET | ORAL | 0 refills | Status: DC | PRN

## 2020-12-30 MED ORDER — KETOROLAC TROMETHAMINE 30 MG/ML IJ SOLN
INTRAMUSCULAR | Status: AC
  Filled 2020-12-30: qty 1

## 2020-12-30 MED ORDER — IBUPROFEN 800 MG PO TABS: 800.0000 mg | ORAL_TABLET | Freq: Three times a day (TID) | ORAL | 0 refills | Status: DC

## 2020-12-30 MED ORDER — OXYCODONE-ACETAMINOPHEN 5-325 MG PO TABS
ORAL_TABLET | ORAL | Status: AC
  Filled 2020-12-30: qty 1

## 2020-12-30 NOTE — Discharge Summary (Signed)
Physician Discharge Summary  Patient ID: Donna Gordon MRN: [id number] DOB/AGE: 38-02-1980 38 y.o.  Admit date: 12/29/2020 Discharge date: 12/30/2020  Admission Diagnoses: HMB  Discharge Diagnoses:  Principal Problem:   Post-operative state   Discharged Condition: good  Hospital Course: [name] was admitted with above Dx. She underwent TVH with LS on 12/29/20 without problems. See OP note for additional information. Post op course was unremarkable. Progressed to ambulating, voiding, tolerating diet, + flatus and good oral pain control. Felt amendable for discharge home. Discharge instructions, medications and follow up were reviewed with the pt. She verbalized understanding.   Consults: None  Significant Diagnostic Studies: labs:  Treatments: surgery: TVH with LS  Discharge Exam: Blood pressure (!) 96/45, pulse 71, temperature 98.1 F (36.7 C), resp. rate 20, height 5\' 5"  (1.651 m), weight 66.8 kg, last menstrual period 12/18/2020, SpO2 97 %. Lungs clear Heart RRR Abd soft + BS GU no bleeding Ext non tender  Disposition: Discharge disposition: 01-Home or Self Care       Discharge Instructions     Call MD for:  difficulty breathing, headache or visual disturbances   Complete by: As directed    Call MD for:  extreme fatigue   Complete by: As directed    Call MD for:  hives   Complete by: As directed    Call MD for:  persistant dizziness or light-headedness   Complete by: As directed    Call MD for:  persistant nausea and vomiting   Complete by: As directed    Call MD for:  redness, tenderness, or signs of infection (pain, swelling, redness, odor or green/yellow discharge around incision site)   Complete by: As directed    Call MD for:  severe uncontrolled pain   Complete by: As directed    Call MD for:  temperature >100.4   Complete by: As directed    Diet - low sodium heart healthy   Complete by: As directed    Increase activity slowly   Complete by:  As directed    Sexual Activity Restrictions   Complete by: As directed    Pelvic rest x 4 weeks      Allergies as of 12/30/2020       Reactions   Dilaudid [hydromorphone Hcl] Itching   Other Hives   SWEET AND SOUR SAUCE        Medication List     STOP taking these medications    acetaminophen 500 MG tablet Commonly known as: TYLENOL   nitrofurantoin (macrocrystal-monohydrate) 100 MG capsule Commonly known as: MACROBID   tranexamic acid 650 MG Tabs tablet Commonly known as: LYSTEDA       TAKE these medications    albuterol 108 (90 Base) MCG/ACT inhaler Commonly known as: VENTOLIN HFA Inhale 1 puff into the lungs every 6 (six) hours as needed for wheezing or shortness of breath.   ibuprofen 800 MG tablet Commonly known as: ADVIL Take 1 tablet (800 mg total) by mouth 3 (three) times daily. What changed:  medication strength how much to take when to take this reasons to take this   oxyCODONE-acetaminophen 5-325 MG tablet Commonly known as: PERCOCET/ROXICET Take 1 tablet by mouth every 4 (four) hours as needed for moderate pain.        Follow-up Information     [facility] for 01/01/[facility]21 [facility] at [facility]. Schedule an appointment as soon as possible for a visit in 4 week(s).   Specialty: Obstetrics and Gynecology Why:  face to face post op appt with Dr. Austin Miles Contact information: [address] [place] [address] [phone]                Signed: Hermina Gordon 12/30/2020, 8:32 AM

## 2020-12-31 LAB — SURGICAL PATHOLOGY

## 2021-01-20 ENCOUNTER — Encounter: Payer: Self-pay | Admitting: Obstetrics and Gynecology

## 2021-01-28 ENCOUNTER — Ambulatory Visit: Payer: Medicaid Other | Admitting: Obstetrics and Gynecology

## 2021-02-08 ENCOUNTER — Other Ambulatory Visit: Payer: Self-pay

## 2021-02-08 ENCOUNTER — Ambulatory Visit (INDEPENDENT_AMBULATORY_CARE_PROVIDER_SITE_OTHER): Payer: Medicaid Other | Admitting: Obstetrics and Gynecology

## 2021-02-08 ENCOUNTER — Encounter: Payer: Self-pay | Admitting: Obstetrics and Gynecology

## 2021-02-08 VITALS — BP 122/96 | HR 91 | Wt 145.2 lb

## 2021-02-08 DIAGNOSIS — Z09 Encounter for follow-up examination after completed treatment for conditions other than malignant neoplasm: Secondary | ICD-10-CM

## 2021-02-09 ENCOUNTER — Encounter: Payer: Self-pay | Admitting: Obstetrics and Gynecology

## 2021-02-09 NOTE — Progress Notes (Signed)
Obstetrics and Gynecology Visit Return Patient Evaluation  Appointment Date: 02/08/2021  Primary Care Provider: [place] for [facility]  Chief Complaint: regular post op check  History of Present Illness:  Donna Gordon is a 41 y.o. s/p 12/20 TVH/LS by Dr. Rip Harbour for menorrhagia; patient was discharged to home on POD#1. Final pathology negative.   Interval History: Since that time, she states that she is overall doing well. She had some vaginal sharp pains a few days ago as well as some vaginal discomfort with urination. No current s/s and no lower urinary tract s/s. No VB  Review of Systems: as noted in the History of Present Illness.  Patient Active Problem List   Diagnosis Date Noted   Post-operative state 12/29/2020   Menorrhagia with regular cycle 12/10/2020   Chronic cystitis 12/10/2020   GAD (generalized anxiety disorder) 08/21/2020   Cigarette nicotine dependence without complication [id number]   Pelvic pain in female 11/22/2018   DUB (dysfunctional uterine bleeding) 06/21/2017   S/P primary low transverse C-section 12/02/2015   Irritable bowel syndrome 08/05/2011   Anxiety and depression 02/21/2011   Constipation 10/22/2010   Medications:  Donna Gordon had no medications administered during this visit. Current Outpatient Medications  Medication Sig Dispense Refill   albuterol (PROVENTIL HFA;VENTOLIN HFA) 108 (90 Base) MCG/ACT inhaler Inhale 1 puff into the lungs every 6 (six) hours as needed for wheezing or shortness of breath. (Patient not taking: Reported on 02/08/2021)     ibuprofen (ADVIL) 800 MG tablet Take 1 tablet (800 mg total) by mouth 3 (three) times daily. (Patient not taking: Reported on 02/08/2021) 30 tablet 0   oxyCODONE-acetaminophen (PERCOCET/ROXICET) 5-325 MG tablet Take 1 tablet by mouth every 4 (four) hours as needed for moderate pain. (Patient not taking: Reported on 02/08/2021) 24 tablet  0   No current facility-administered medications for this visit.    Allergies: is allergic to dilaudid [hydromorphone hcl] and other.  Physical Exam:  BP (!) 122/96    Pulse 91    Wt 145 lb 3.2 oz (65.9 kg)    LMP 12/18/2020 (Approximate)    BMI 24.16 kg/m  Body mass index is 24.16 kg/m. General appearance: Well nourished, well developed female in no acute distress.  Abdomen: diffusely non tender to palpation, non distended, and no masses, hernias Neuro/Psych:  Normal mood and affect.    Pelvic exam:  EGBUS: normal Vaginal vault: normal Cuff: normal. Stitches still present Bimanual: negative   Assessment: pt doing well  Plan:  1. Postop check Will d/w Dr. Rip Harbour and have him contact her as to when he would like for her to come off precautions   RTC: PRN  Durene Romans MD Attending Center for [facility])

## 2021-05-05 ENCOUNTER — Emergency Department (HOSPITAL_BASED_OUTPATIENT_CLINIC_OR_DEPARTMENT_OTHER): Payer: Medicaid Other

## 2021-05-05 ENCOUNTER — Encounter (HOSPITAL_BASED_OUTPATIENT_CLINIC_OR_DEPARTMENT_OTHER): Payer: Self-pay

## 2021-05-05 ENCOUNTER — Other Ambulatory Visit: Payer: Self-pay

## 2021-05-05 ENCOUNTER — Emergency Department (HOSPITAL_BASED_OUTPATIENT_CLINIC_OR_DEPARTMENT_OTHER)
Admission: EM | Admit: 2021-05-05 | Discharge: 2021-05-05 | Disposition: A | Discharge status: Home / Self Care | Payer: Medicaid Other | Attending: Emergency Medicine | Admitting: Emergency Medicine

## 2021-05-05 DIAGNOSIS — D72829 Elevated white blood cell count, unspecified: Secondary | ICD-10-CM | POA: Diagnosis not present

## 2021-05-05 DIAGNOSIS — R0602 Shortness of breath: Secondary | ICD-10-CM | POA: Diagnosis not present

## 2021-05-05 DIAGNOSIS — F419 Anxiety disorder, unspecified: Secondary | ICD-10-CM | POA: Diagnosis not present

## 2021-05-05 DIAGNOSIS — R079 Chest pain, unspecified: Secondary | ICD-10-CM | POA: Insufficient documentation

## 2021-05-05 LAB — CBC
HCT: 42.4 % (ref 36.0–46.0)
Hemoglobin: 15 g/dL (ref 12.0–15.0)
MCH: 30.8 pg (ref 26.0–34.0)
MCHC: 35.4 g/dL (ref 30.0–36.0)
MCV: 87.1 fL (ref 80.0–100.0)
Platelets: 362 10*3/uL (ref 150–400)
RBC: 4.87 MIL/uL (ref 3.87–5.11)
RDW: 12.9 % (ref 11.5–15.5)
WBC: 10.6 10*3/uL — ABNORMAL HIGH (ref 4.0–10.5)
nRBC: 0 % (ref 0.0–0.2)

## 2021-05-05 LAB — BASIC METABOLIC PANEL
Anion gap: 11 (ref 5–15)
BUN: 12 mg/dL (ref 6–20)
CO2: 20 mmol/L — ABNORMAL LOW (ref 22–32)
Calcium: 9.7 mg/dL (ref 8.9–10.3)
Chloride: 107 mmol/L (ref 98–111)
Creatinine, Ser: 0.9 mg/dL (ref 0.44–1.00)
GFR, Estimated: >60 mL/min (ref >60)
Glucose, Bld: 100 mg/dL — ABNORMAL HIGH (ref 70–99)
Potassium: 3.2 mmol/L — ABNORMAL LOW (ref 3.5–5.1)
Sodium: 138 mmol/L (ref 135–145)

## 2021-05-05 LAB — TROPONIN I (HIGH SENSITIVITY): Troponin I (High Sensitivity): 4 ng/L (ref <18)

## 2021-05-05 LAB — D-DIMER, QUANTITATIVE: D-Dimer, Quant: 0.35 ug/mL-FEU (ref 0.00–0.50)

## 2021-05-05 MED ORDER — POTASSIUM CHLORIDE CRYS ER 20 MEQ PO TBCR
20.0000 meq | EXTENDED_RELEASE_TABLET | Freq: Once | ORAL | Status: AC
  Administered 2021-05-05: 20 meq via ORAL
  Filled 2021-05-05: qty 1

## 2021-05-05 MED ORDER — ONDANSETRON HCL 4 MG/2ML IJ SOLN
4.0000 mg | Freq: Once | INTRAMUSCULAR | Status: AC
  Administered 2021-05-05: 4 mg via INTRAVENOUS
  Filled 2021-05-05: qty 2

## 2021-05-05 MED ORDER — MORPHINE SULFATE (PF) 2 MG/ML IV SOLN
2.0000 mg | Freq: Once | INTRAVENOUS | Status: AC
  Administered 2021-05-05: 2 mg via INTRAVENOUS
  Filled 2021-05-05: qty 1

## 2021-05-05 NOTE — ED Notes (Signed)
Radiology at bedside for port chest xray 

## 2021-05-05 NOTE — Discharge Instructions (Addendum)
You were seen today for chest pain.  EKG and lab work was reassuring for no cardiac event.  No signs of pulmonary embolism.  No signs of pneumonia.  I recommend follow-up with your primary care physician and possibly follow-up with cardiology.  If you have a return of chest pain, shortness of breath, or other life-threatening events please return to the emergency department ?

## 2021-05-05 NOTE — ED Triage Notes (Signed)
Pt c/o chest pain, ShOB with gradual onset starting yesterday morning.  ?

## 2021-05-05 NOTE — ED Provider Notes (Signed)
?[facility] EMERGENCY DEPARTMENT ?Provider Note ? ? ?CSN: [id number] ?Arrival date & time: 05/05/21  1616 ? ?  ? ?History ? ?Chief Complaint  ?Patient presents with  ? Shortness of Breath  ? Chest Pain  ? ? ?Donna Gordon is a 41 y.o. female.  Patient presents to the emergency department complaining of chest pain and shortness of breath.  Patient states that symptoms began yesterday morning.  Symptoms have been ongoing for over 24 hours.  Denies radiation of chest pain.  Pain is in the left side of the chest.  Patient states that she tried using a breathing treatment yesterday.  She denies relief after medication.  She has used a metered-dose inhaler multiple times today with no relief.  Patient states that she has checked her pulse oximeter at home and is seeing readings at 85% and is worried and feels anxious.  No other medication has been taken at home.  The patient states that nothing seems to make her chest pain better or worse.  Past medical history significant for anxiety and depression, generalized anxiety disorder, cigarette dependence. ? ?HPI ? ?  ? ?Home Medications ?Prior to Admission medications   ?Medication Sig Start Date End Date Taking? Authorizing Provider  ?albuterol (PROVENTIL HFA;VENTOLIN HFA) 108 (90 Base) MCG/ACT inhaler Inhale 1 puff into the lungs every 6 (six) hours as needed for wheezing or shortness of breath. ?Patient not taking: Reported on 02/08/2021    [provider]  ?ibuprofen (ADVIL) 800 MG tablet Take 1 tablet (800 mg total) by mouth 3 (three) times daily. ?Patient not taking: Reported on 02/08/2021 12/30/20   [name], [name], MD  ?oxyCODONE-acetaminophen (PERCOCET/ROXICET) 5-325 MG tablet Take 1 tablet by mouth every 4 (four) hours as needed for moderate pain. ?Patient not taking: Reported on 02/08/2021 12/30/20   [name], [name], MD  ?drospirenone-ethinyl estradiol (YASMIN 28) 3-0.03 MG tablet Take 1 tablet by mouth daily. ?Patient not taking: Reported on  06/17/2020 03/09/20 06/17/20  Adam PhenixArnold, James G, MD  ?   ? ?Allergies    ?Dilaudid [hydromorphone hcl] and Other   ? ?Review of Systems   ?Review of Systems  ?Constitutional:  Negative for fever.  ?Respiratory:  Positive for shortness of breath.   ?Cardiovascular:  Positive for chest pain.  ?Gastrointestinal:  Negative for abdominal pain and nausea.  ?Genitourinary:  Negative for dysuria.  ? ?Physical Exam ?Updated Vital Signs ?BP 128/78   Pulse 77   Temp 98.4 ?F (36.9 ?C) (Oral)   Resp 13   Ht 5\' 5"  (1.651 m)   Wt 64.9 kg   LMP 12/18/2020 (Approximate)   SpO2 98%   BMI 23.80 kg/m?  ?Physical Exam ?Vitals and nursing note reviewed.  ?Constitutional:   ?   General: She is not in acute distress. ?HENT:  ?   Head: Normocephalic and atraumatic.  ?Eyes:  ?   Pupils: Pupils are equal, round, and reactive to light.  ?Cardiovascular:  ?   Rate and Rhythm: Normal rate and regular rhythm.  ?   Heart sounds: Normal heart sounds.  ?Pulmonary:  ?   Effort: Pulmonary effort is normal. No respiratory distress.  ?   Breath sounds: Normal breath sounds. No decreased breath sounds, wheezing, rhonchi or rales.  ?Chest:  ?   Chest wall: No tenderness.  ?Abdominal:  ?   Palpations: Abdomen is soft.  ?   Tenderness: There is no abdominal tenderness.  ?Musculoskeletal:     ?   General: Normal range of  motion.  ?   Cervical back: Normal range of motion.  ?   Right lower leg: No edema.  ?   Left lower leg: No edema.  ?Skin: ?   General: Skin is warm and dry.  ?Neurological:  ?   Mental Status: She is alert and oriented to person, place, and time.  ?Psychiatric:     ?   Mood and Affect: Mood is anxious.  ? ? ?ED Results / Procedures / Treatments   ?Labs ?(all labs ordered are listed, but only abnormal results are displayed) ?Labs Reviewed  ?BASIC METABOLIC PANEL - Abnormal; Notable for the following components:  ?    Result Value  ? Potassium 3.2 (*)   ? CO2 20 (*)   ? Glucose, Bld 100 (*)   ? All other components within normal limits  ?CBC  - Abnormal; Notable for the following components:  ? WBC 10.6 (*)   ? All other components within normal limits  ?D-DIMER, QUANTITATIVE  ?TROPONIN I (HIGH SENSITIVITY)  ?TROPONIN I (HIGH SENSITIVITY)  ? ? ?EKG ?EKG Interpretation ? ?Date/Time:  Wednesday May 05 2021 16:30:33 EDT ?Ventricular Rate:  102 ?PR Interval:  136 ?QRS Duration: 100 ?QT Interval:  380 ?QTC Calculation: 495 ?R Axis:   81 ?Text Interpretation: Sinus tachycardia Borderline prolonged QT interval No previous ECGs available Confirmed by Vanetta Mulders [phone]) on 05/05/2021 4:45:42 PM ? ?Radiology ?DG Chest Port 1 View ? ?Result Date: 05/05/2021 ?CLINICAL DATA:  Chest pain. Shortness of breath. Left chest pain. History of asthma. EXAM: PORTABLE CHEST 1 VIEW COMPARISON:  Chest two views 01/19/2011, 06/30/2008; CT chest 01/19/2011 FINDINGS: Cardiac silhouette and mediastinal contours are within normal limits. Minimal curvilinear scarring within the lingula similar to prior CT. Otherwise, the lungs are clear. No pleural effusion or pneumothorax. No acute skeletal abnormality. IMPRESSION: No active disease. Electronically Signed   By: Neita Garnet M.D.   On: 05/05/2021 17:08   ? ?Procedures ?Procedures  ? ? ?Medications Ordered in ED ?Medications  ?morphine (PF) 2 MG/ML injection 2 mg (2 mg Intravenous Given 05/05/21 1658)  ?ondansetron (ZOFRAN) injection 4 mg (4 mg Intravenous Given 05/05/21 1657)  ?potassium chloride SA (KLOR-CON M) CR tablet 20 mEq (20 mEq Oral Given 05/05/21 1753)  ? ? ?ED Course/ Medical Decision Making/ A&P ?  ?                        ?Medical Decision Making ?Amount and/or Complexity of Data Reviewed ?Labs: ordered. ?Radiology: ordered. ? ?Risk ?Prescription drug management. ? ? ?This patient presents to the ED for concern of chest pain and shortness of breath, this involves an extensive number of treatment options, and is a complaint that carries with it a high risk of complications and morbidity.  The differential diagnosis  includes ACS, PE, pneumonia, and others ? ? ?Co morbidities that complicate the patient evaluation ? ?History of anxiety ? ? ? ?Lab Tests: ? ?I Ordered, and personally interpreted labs.  The pertinent results include: Dimer 0.35, troponin 4, WBC 10.6, potassium 3.2 ? ? ?Imaging Studies ordered: ? ?I ordered imaging studies including chest x-ray ?I independently visualized and interpreted imaging which showed no active disease ?I agree with the radiologist interpretation ? ? ?Cardiac Monitoring: / EKG: ? ?The patient was maintained on a cardiac monitor.  I personally viewed and interpreted the cardiac monitored which showed an underlying rhythm of: Sinus rhythm ? ?Problem List / ED Course / Critical interventions /  Medication management ? ? ?I ordered medication including morphine for pain, Zofran for nausea, potassium for hypokalemia ?Reevaluation of the patient after these medicines showed that the patient improved ?I have reviewed the patients home medicines and have made adjustments as needed ? ? ?Test / Admission - Considered: ? ?The patient has a very slightly elevated white count, no signs of pneumonia on chest x-ray.  Patient does not have an elevated D-dimer.  No evidence of PE.  No ischemic changes on EKG.  Initial troponin 4.  Heart score of 1.  ACS unlikely. ? ?The patient has nonspecific chest pain with no evidence of PE, ACS, or pneumonia at this time.  Patient feels much better after pain medication.  Patient does have history of anxiety and patient's heart rate has decreased by 30 bpm since initial evaluation.  Patient states she feels much less anxious at this time.  I see no reason for further work-up at this time.  I believe the patient may discharge home.  I did advise the patient to not use breathing treatments unless she has active wheezing as the breathing treatments will increase her heart rate and are counterproductive to what she is trying to achieve.  I recommend follow-up with primary care  and recommend that she see cardiology as initially proposed by primary care.  ? ? ?Final Clinical Impression(s) / ED Diagnoses ?Final diagnoses:  ?Chest pain, unspecified type  ? ? ?Rx / DC Orders ?ED Discharge

## 2021-05-05 NOTE — ED Notes (Signed)
ED Provider at bedside to review results 

## 2021-05-31 ENCOUNTER — Other Ambulatory Visit: Payer: Self-pay

## 2021-05-31 ENCOUNTER — Emergency Department (HOSPITAL_BASED_OUTPATIENT_CLINIC_OR_DEPARTMENT_OTHER)
Admission: EM | Admit: 2021-05-31 | Discharge: 2021-05-31 | Disposition: A | Discharge status: Home / Self Care | Payer: Medicaid Other | Attending: Emergency Medicine | Admitting: Emergency Medicine

## 2021-05-31 ENCOUNTER — Encounter (HOSPITAL_BASED_OUTPATIENT_CLINIC_OR_DEPARTMENT_OTHER): Payer: Self-pay

## 2021-05-31 ENCOUNTER — Emergency Department (HOSPITAL_BASED_OUTPATIENT_CLINIC_OR_DEPARTMENT_OTHER): Payer: Medicaid Other

## 2021-05-31 DIAGNOSIS — S8002XA Contusion of left knee, initial encounter: Secondary | ICD-10-CM | POA: Diagnosis not present

## 2021-05-31 DIAGNOSIS — X58XXXA Exposure to other specified factors, initial encounter: Secondary | ICD-10-CM | POA: Insufficient documentation

## 2021-05-31 DIAGNOSIS — M79605 Pain in left leg: Secondary | ICD-10-CM | POA: Insufficient documentation

## 2021-05-31 DIAGNOSIS — S8992XA Unspecified injury of left lower leg, initial encounter: Secondary | ICD-10-CM | POA: Diagnosis present

## 2021-05-31 NOTE — ED Provider Notes (Signed)
[facility] EMERGENCY DEPARTMENT Provider Note   CSN: [id number] Arrival date & time: 05/31/21  1524     History  Chief Complaint  Patient presents with   Knee Pain    Donna Gordon is a 41 y.o. female.   Knee Pain Patient is a 41 year old female presented emergency room today with left knee pain behind the left knee that started greater than 48 hours ago.  She states that she was at a theater performance 2 days ago in the evening and states that during the performance the area just behind her knee began hurting.  She noticed a bruise later that evening.  She states has had achy pain there since is not worsened improved was rather stay the same and then relatively achy.  She states it does hurt somewhat to move at the knee with extension and flexion.  She denies any numbness and states that she does not feel particularly weak in that leg.  She has taken no medications prior to arrival.      Home Medications Prior to Admission medications   Medication Sig Start Date End Date Taking? Authorizing Provider  albuterol (PROVENTIL HFA;VENTOLIN HFA) 108 (90 Base) MCG/ACT inhaler Inhale 1 puff into the lungs every 6 (six) hours as needed for wheezing or shortness of breath. Patient not taking: Reported on 02/08/2021    [provider]  ibuprofen (ADVIL) 800 MG tablet Take 1 tablet (800 mg total) by mouth 3 (three) times daily. Patient not taking: Reported on 02/08/2021 12/30/20   [name], [name], MD  oxyCODONE-acetaminophen (PERCOCET/ROXICET) 5-325 MG tablet Take 1 tablet by mouth every 4 (four) hours as needed for moderate pain. Patient not taking: Reported on 02/08/2021 12/30/20   [name], [name], MD  drospirenone-ethinyl estradiol (YASMIN 28) 3-0.03 MG tablet Take 1 tablet by mouth daily. Patient not taking: Reported on 06/17/2020 03/09/20 06/17/20  Adam PhenixArnold, James G, MD      Allergies    Dilaudid [hydromorphone hcl] and Other    Review of Systems   Review of  Systems  Physical Exam Updated Vital Signs BP 108/72 (BP Location: Left Arm)   Pulse 73   Temp 98.3 F (36.8 C) (Oral)   Resp 18   Ht 5\' 5"  (1.651 m)   Wt 64.9 kg   LMP 12/18/2020 (Approximate)   SpO2 99%   BMI 23.80 kg/m  Physical Exam Vitals and nursing note reviewed.  Constitutional:      General: She is not in acute distress.    Appearance: Normal appearance. She is not ill-appearing.  HENT:     Head: Normocephalic and atraumatic.  Eyes:     General: No scleral icterus.       Right eye: No discharge.        Left eye: No discharge.     Conjunctiva/sclera: Conjunctivae normal.  Pulmonary:     Effort: Pulmonary effort is normal.     Breath sounds: No stridor.  Musculoskeletal:     Comments: Area of bruising behind left knee on the great toe side.  Some tenderness to palpation here.  Distal pulses are 3+ and symmetric.  Cap refill intact.  Sensation in all aspects of foot.  Ambulatory with slight limp  Neurological:     Mental Status: She is alert and oriented to person, place, and time. Mental status is at baseline.    ED Results / Procedures / Treatments   Labs (all labs ordered are listed, but only abnormal results are displayed) Labs  Reviewed - No data to display  EKG None  Radiology US Venous Img Lower  Left (DVT Study)  Result Date: 05/31/2021 CLINICAL DATA:  Left lower extremity pain and edema. EXAM: LEFT LOWER EXTREMITY VENOUS DOPPLER ULTRASOUND TECHNIQUE: Gray-scale sonography with graded compression, as well as color Doppler and duplex ultrasound were performed to evaluate the lower extremity deep venous systems from the level of the common femoral vein and including the common femoral, femoral, profunda femoral, popliteal and calf veins including the posterior tibial, peroneal and gastrocnemius veins when visible. The superficial great saphenous vein was also interrogated. Spectral Doppler was utilized to evaluate flow at rest and with distal augmentation  maneuvers in the common femoral, femoral and popliteal veins. COMPARISON:  None Available. FINDINGS: Contralateral Common Femoral Vein: Respiratory phasicity is normal and symmetric with the symptomatic side. No evidence of thrombus. Normal compressibility. Common Femoral Vein: No evidence of thrombus. Normal compressibility, respiratory phasicity and response to augmentation. Saphenofemoral Junction: No evidence of thrombus. Normal compressibility and flow on color Doppler imaging. Profunda Femoral Vein: No evidence of thrombus. Normal compressibility and flow on color Doppler imaging. Femoral Vein: No evidence of thrombus. Normal compressibility, respiratory phasicity and response to augmentation. Popliteal Vein: No evidence of thrombus. Normal compressibility, respiratory phasicity and response to augmentation. Calf Veins: No evidence of thrombus. Normal compressibility and flow on color Doppler imaging. Superficial Great Saphenous Vein: No evidence of thrombus. Normal compressibility. Venous Reflux:  None. Other Findings: No evidence of superficial thrombophlebitis or abnormal fluid collection. IMPRESSION: No evidence of left lower extremity deep venous thrombosis. Electronically Signed   By: Irish Lack M.D.   On: 05/31/2021 16:56    Procedures Procedures    Medications Ordered in ED Medications - No data to display  ED Course/ Medical Decision Making/ A&P                           Medical Decision Making  Patient is a 41 year old female presented emergency room today with left knee pain behind the left knee that started greater than 48 hours ago.  She states that she was at a theater performance 2 days ago in the evening and states that during the performance the area just behind her knee began hurting.  She noticed a bruise later that evening.  She states has had achy pain there since is not worsened improved was rather stay the same and then relatively achy.  She states it does hurt  somewhat to move at the knee with extension and flexion.  She denies any numbness and states that she does not feel particularly weak in that leg.  She has taken no medications prior to arrival.   IMPRESSION:  No evidence of left lower extremity deep venous thrombosis.   I personally reviewed images from DVT study.  I do not appreciate any blood clots..  I reviewed radiology read which confirms this. Negative for DVT History and physical exam not concerning for fracture.  Will hold off on x-ray I do not believe will be useful  Given the history that she provided me and my physical exam I suspect the patient has a blood vessel documenting.  There is some bruising here she has good strength I doubt muscular tear.  No ligamentous laxity on exam.  Recommend cool compresses, Tylenol ibuprofen and follow-up with PCP.   Final Clinical Impression(s) / ED Diagnoses Final diagnoses:  Left leg pain    Rx / DC Orders  ED Discharge Orders     None         Gailen Shelter, [place] 05/31/21 1744    Jacalyn Lefevre, MD 05/31/21 [id number]

## 2021-05-31 NOTE — Discharge Instructions (Addendum)
The ultrasound of your left leg was without any evidence of clots.  As we discussed I suspect that this is related to a burst of pain in your leg.  We will compresses, elevation, gentle stretching, gentle massage, Tylenol ibuprofen as discussed below and follow-up with your primary care provider. Return to ER for any new or concerning symptoms.  Please use Tylenol or ibuprofen for pain.  You may use 600 mg ibuprofen every 6 hours or 1000 mg of Tylenol every 6 hours.  You may choose to alternate between the 2.  This would be most effective.  Not to exceed 4 g of Tylenol within 24 hours.  Not to exceed 3200 mg ibuprofen 24 hours.

## 2021-05-31 NOTE — ED Triage Notes (Signed)
Pt presents with posterior L knee pain that started 2 days ago. Pt states today there is bruising behind the knee. Denies injury.

## 2022-05-07 NOTE — Progress Notes (Signed)
NEW PATIENT Date of Service/Encounter:  05/09/22 Referring provider: Susa Loffler, NP Primary care provider: Susa Loffler, NP  Subjective:  Donna Gordon is a 42 y.o. female presenting today for evaluation of hives. History obtained from: chart review and patient.   Rash: started 1-2 years ago, occurring daily as of the past few 3 months Associates burning but not itching. Has eyelid swelling and lip swelling.  Denies other systemic symptoms including no respiratory, gastrointestinal or cardiovascular distress. Sometimes feels very tired during episodes. No changes to personal care products, detergents, diet, mediacations etc Therapies tried: prednisone doesn't help, claritin, benadryl, cortisone cream She has been using 3 to 4 claritin per day and either benadryl and atarax at night to help with sleep for several weeks with continued breakthrough hives. Potential triggers: unknown Does not associate fever, joint pain, joint swelling. Has had a little weight loss-maybe 8 lbs in the last few months. Lesions resolve in hours to day (up to 2-3 days)  No bruising on resolution. No recent illness or vaccines. Pictures reviewed and consistent with urticaria with angioedema.  Chart review: visit-hives x 2 years, worsening x 6 months. + fatigue Prednisone taper prescribed. Labs: HbA1C normal, ESR normal, normal TSH, slightly decreased T3 uptake with normal T4, normal B12 and vitamin D, normal CBCd with AEC 100, CMP normal, iron studies normal.   Asthma:  Diagnosed in childhood. Never hospitalized. No steroids for asthma in past year or ED visits. She has a nebulizer with albuterol that she has not used in 2 years ago.  Sweet and sour sauce makes her break out into hives.   Other allergy screening: Medication allergy: no Hymenoptera allergy: no Eczema:no  Past Medical History: Past Medical History:  Diagnosis Date   Asthma    prn inhaler   Chest pain 11/25/2020    with pvc's worked up with holter monitor 11-09-2020  by [facility] echo stress 12-22-2020   Depression    Family history of adverse reaction to anesthesia    maternal grandmother has hx. of being hard to wake up post-op   Migraines    Numbness    last few months being worked up for ms has head mri friday 12-25-2020, numbness right side arm foot and toes several x day   Sciatic nerve pain    right lower back   Skin abnormality    gets whelps all over body for last year none currently pcp to sent pt to dermatology   Uterine polyp 08/2017   Medication List:  Current Outpatient Medications  Medication Sig Dispense Refill   albuterol (PROVENTIL HFA;VENTOLIN HFA) 108 (90 Base) MCG/ACT inhaler Inhale 1 puff into the lungs every 6 (six) hours as needed for wheezing or shortness of breath.     cetirizine (ZYRTEC) 10 MG tablet Take 10 mg by mouth daily. (Patient not taking: Reported on 05/09/2022)     No current facility-administered medications for this visit.   Known Allergies:  Allergies  Allergen Reactions   Dilaudid [Hydromorphone Hcl] Itching   Other Hives    SWEET AND SOUR SAUCE   Past Surgical History: Past Surgical History:  Procedure Laterality Date   CESAREAN SECTION N/A 12/02/2015   Procedure: CESAREAN SECTION;  Surgeon: Federico Flake, MD;  Location: [facility];  Service: Obstetrics;  Laterality: N/A;   CHOLECYSTECTOMY, LAPAROSCOPIC  10/07/2014   DILATATION & CURETTAGE/HYSTEROSCOPY WITH MYOSURE N/A 09/06/2017   Procedure: DILATATION & CURETTAGE/HYSTEROSCOPY;  Surgeon: Adam Phenix, MD;  Location: [facility];  Service: Gynecology;  Laterality: N/A;   MYOMECTOMY ABDOMINAL APPROACH  08/06/2007   VAGINAL HYSTERECTOMY Bilateral 12/29/2020   Procedure: HYSTERECTOMY VAGINAL WITH LEFT SALPINGECTOMY;  Surgeon: [name], MD;  Location: [facility];  Service: Gynecology;  Laterality: Bilateral;   Family History: Family  History  Problem Relation Age of Onset   Lupus Mother    Thyroid cancer Mother    Lupus Sister    Emphysema Maternal Aunt    COPD Maternal Aunt    Cancer Paternal Aunt 31       pancreatic    Cancer Maternal Grandmother 72       breast   Anesthesia problems Maternal Grandmother        hard to wake up post-op   Asthma Paternal Grandmother    COPD Paternal Grandmother    Emphysema Paternal Grandmother    Eczema Daughter    Asthma Daughter    Allergic rhinitis Neg Hx    Atopy Neg Hx    Urticaria Neg Hx    Angioedema Neg Hx    Social History: Laniyah lives in a house without water damage, wood floors, gas heating, central AC, indoor dogs, no roaches, using dust mite protection on the bedding and pillows, no smoke exposure.  She does not work.  + HEPA filter in the home.  [place]  She smokes 1 PPD x 12 years.   ROS:  All other systems negative except as noted per HPI.  Objective:  Blood pressure 132/74, pulse 78, temperature 98.2 F (36.8 C), temperature source Temporal, resp. rate 16, height 5\' 5"  (1.651 m), weight 129 lb (58.5 kg), last menstrual period 12/18/2020, SpO2 100 %. Body mass index is 21.47 kg/m. Physical Exam:  General Appearance:  Alert, cooperative, no distress, appears stated age  Head:  Normocephalic, without obvious abnormality, atraumatic  Eyes:  Conjunctiva clear, EOM's intact  Nose: Nares normal, hypertrophic turbinates, normal mucosa, and no visible anterior polyps  Throat: Lips, tongue normal; teeth and gums normal, normal posterior oropharynx  Neck: Supple, symmetrical  Lungs:   clear to auscultation bilaterally, Respirations unlabored, no coughing  Heart:  regular rate and rhythm and no murmur, Appears well perfused  Extremities: No edema  Skin: Skin color, texture, turgor normal and no rashes or lesions on visualized portions of skin  Neurologic: No gross deficits   Diagnostics: Spirometry:  Tracings reviewed.  Her effort: Good reproducible efforts. FVC: 3.71L  FEV1: 2.88L, 93% predicted FEV1/FVC ratio: 0.78  Interpretation: Spirometry consistent with normal pattern   Assessment and Plan  Chronic Idiopathic Urticaria: - this is defined as hives lasting more than 6 weeks without an identifiable trigger - hives can be from a number of different sources including infections, allergies, vibration, temperature, pressure among many others other possible causes - often an identifiable cause is not determined - some potential triggers include: stress, illness, NSAIDs, aspirin, hormonal changes - you do not have any red flag symptoms to make [place] concerned about secondary causes of hives, but we will screen for these for reassurance with: tryptase (checks for mast cell activation syndrome) and CU index-often elevated in autoimmune hives". - approximately 50% of patients with chronic hives can have some associated swelling of the face/lips/eyelids (this is not a cause for alarm and does not typically progress onto systemic allergic reactions)  Therapy Plan:  - start zyrtec (cetirizine) to max dose of 20mg  (2 pills) twice daily- this is maximum dose -  can increase or decrease dosing depending on symptom control to a maximum dose of 4 tablets of antihistamine daily. Wait until hives free for at least one month prior to decreasing dose.   - sample Xolair (omalizumab)- an injectable medication for hives; will submit to insurance and plan to continue monthly  Can use one of the following in place of zyrtec if desires: Claritin (loratadine) 10 mg, Xyzal (levocetirizine) 5 mg or Allegra (fexofenadine) 180 mg daily as needed  Intermittent Asthma: - Rescue Inhaler:  albuterol 1 vial via nebulizer . Use  every 4-6 hours as needed for chest tightness, wheezing, or coughing.  Can also use 15 minutes prior to exercise if you have symptoms with activity. - Asthma is not controlled if:  - Symptoms are occurring >2 times a  week OR  - >2 times a month nighttime awakenings  - You are requiring systemic steroids (prednisone/steroid injections) more than once per year  - Your require hospitalization for your asthma.  - Please call the clinic to schedule a follow up if these symptoms arise   Follow up : 2-3 months,  It was a pleasure meeting you in clinic today! Thank you for allowing me to participate in your care.  This note in its entirety was forwarded to the Provider who requested this consultation.  Thank you for your kind referral. I appreciate the opportunity to take part in Shaquita's care. Please do not hesitate to contact me with questions.  Sincerely,  Tonny Bollman, MD [facility]

## 2022-05-09 ENCOUNTER — Encounter: Payer: Self-pay | Admitting: Internal Medicine

## 2022-05-09 ENCOUNTER — Ambulatory Visit: Payer: Medicaid Other | Admitting: Internal Medicine

## 2022-05-09 VITALS — BP 132/74 | HR 78 | Temp 98.2°F | Resp 16 | Ht 65.0 in | Wt 129.0 lb

## 2022-05-09 DIAGNOSIS — J4520 Mild intermittent asthma, uncomplicated: Secondary | ICD-10-CM | POA: Diagnosis not present

## 2022-05-09 DIAGNOSIS — L508 Other urticaria: Secondary | ICD-10-CM | POA: Diagnosis not present

## 2022-05-09 MED ORDER — OMALIZUMAB 150 MG ~~LOC~~ SOLR
300.0000 mg | Freq: Once | SUBCUTANEOUS | Status: AC
  Administered 2022-05-09: 300 mg via SUBCUTANEOUS

## 2022-05-09 NOTE — Patient Instructions (Addendum)
Chronic Idiopathic Urticaria: - this is defined as hives lasting more than 6 weeks without an identifiable trigger - hives can be from a number of different sources including infections, allergies, vibration, temperature, pressure among many others other possible causes - often an identifiable cause is not determined - some potential triggers include: stress, illness, NSAIDs, aspirin, hormonal changes - you do not have any red flag symptoms to make [place] concerned about secondary causes of hives, but we will screen for these for reassurance with:  tryptase (checks for mast cell activation syndrome) and CU index-often elevated in autoimmune hives". - approximately 50% of patients with chronic hives can have some associated swelling of the face/lips/eyelids (this is not a cause for alarm and does not typically progress onto systemic allergic reactions)  Therapy Plan:  - start zyrtec (cetirizine) to max dose of 20mg  (2 pills) twice daily- this is maximum dose - can increase or decrease dosing depending on symptom control to a maximum dose of 4 tablets of antihistamine daily. Wait until hives free for at least one month prior to decreasing dose.   - sample Xolair (omalizumab)- an injectable medication for hives; will submit to insurance and plan to continue monthly  Can use one of the following in place of zyrtec if desires: Claritin (loratadine) 10 mg, Xyzal (levocetirizine) 5 mg or Allegra (fexofenadine) 180 mg daily as needed  Intermittent Asthma: - Rescue Inhaler: albuterol 1 vial via nebulizer. Use  every 4-6 hours as needed for chest tightness, wheezing, or coughing.  Can also use 15 minutes prior to exercise if you have symptoms with activity. - Asthma is not controlled if:  - Symptoms are occurring >2 times a week OR  - >2 times a month nighttime awakenings  - You are requiring systemic steroids (prednisone/steroid injections) more than once per year  - Your require hospitalization for your  asthma.  - Please call the clinic to schedule a follow up if these symptoms arise  Follow up : 2-3 months,  It was a pleasure meeting you in clinic today! Thank you for allowing me to participate in your care.  Tonny Bollman, MD [facility] 

## 2022-05-11 ENCOUNTER — Other Ambulatory Visit (HOSPITAL_COMMUNITY): Payer: Self-pay

## 2022-05-11 ENCOUNTER — Telehealth: Payer: Self-pay | Admitting: *Deleted

## 2022-05-11 MED ORDER — OMALIZUMAB 150 MG/ML ~~LOC~~ SOSY
300.0000 mg | PREFILLED_SYRINGE | SUBCUTANEOUS | 11 refills | Status: DC
  Filled 2022-05-11 – 2022-05-12 (×2): qty 2, 28d supply, fill #0
  Filled 2022-08-03: qty 2, 28d supply, fill #1
  Filled 2022-08-25: qty 2, 28d supply, fill #2
  Filled 2022-09-20: qty 2, 28d supply, fill #3
  Filled 2022-10-20: qty 2, 28d supply, fill #4
  Filled 2022-11-23: qty 2, 28d supply, fill #5
  Filled 2022-12-28: qty 2, 28d supply, fill #6
  Filled 2023-01-24: qty 2, 28d supply, fill #7
  Filled 2023-02-23: qty 2, 28d supply, fill #8
  Filled 2023-03-30: qty 2, 28d supply, fill #9
  Filled 2023-04-26: qty 2, 28d supply, fill #10

## 2022-05-11 NOTE — Telephone Encounter (Signed)
Spoke to patient and advised approval and submit to [employer]. Patient is interested in home admin which I advised she can change over after 3rd injections. Will reach out to patient for next injection once delivery set

## 2022-05-11 NOTE — Telephone Encounter (Signed)
-----   Message from Verlee Monte, MD sent at 05/09/2022 12:46 PM EDT ----- Can we submit for xolair hives? 300 mg sample given today in clinic. Thank you!

## 2022-05-11 NOTE — Telephone Encounter (Signed)
L/m for patient to contact me to advise approval and submit xolair to [employer]

## 2022-05-12 ENCOUNTER — Other Ambulatory Visit: Payer: Self-pay

## 2022-05-12 ENCOUNTER — Other Ambulatory Visit (HOSPITAL_COMMUNITY): Payer: Self-pay

## 2022-05-13 ENCOUNTER — Other Ambulatory Visit (HOSPITAL_COMMUNITY): Payer: Self-pay

## 2022-05-16 ENCOUNTER — Other Ambulatory Visit: Payer: Self-pay

## 2022-05-19 LAB — CHRONIC URTICARIA: cu index: 4.1 (ref <10)

## 2022-05-19 LAB — TRYPTASE: Tryptase: 6.9 ug/L (ref 2.2–13.2)

## 2022-06-07 ENCOUNTER — Ambulatory Visit: Payer: Medicaid Other

## 2022-06-08 ENCOUNTER — Other Ambulatory Visit (HOSPITAL_COMMUNITY): Payer: Self-pay

## 2022-06-20 ENCOUNTER — Other Ambulatory Visit (HOSPITAL_COMMUNITY): Payer: Self-pay

## 2022-07-12 ENCOUNTER — Ambulatory Visit (INDEPENDENT_AMBULATORY_CARE_PROVIDER_SITE_OTHER): Payer: Medicaid Other

## 2022-07-12 DIAGNOSIS — L501 Idiopathic urticaria: Secondary | ICD-10-CM | POA: Diagnosis not present

## 2022-07-12 MED ORDER — OMALIZUMAB 150 MG/ML ~~LOC~~ SOSY
300.0000 mg | PREFILLED_SYRINGE | SUBCUTANEOUS | Status: AC
  Administered 2022-07-12 – 2022-08-09 (×2): 300 mg via SUBCUTANEOUS

## 2022-07-18 ENCOUNTER — Telehealth: Payer: Self-pay | Admitting: *Deleted

## 2022-07-18 NOTE — Telephone Encounter (Signed)
Patient was wondering if she will need to order her Xolair once she starts at home administration? I advised that I would reach out to you and double check so that we can provide her with the information necessary to order on her own when she is ready to administer at home.

## 2022-07-19 NOTE — Telephone Encounter (Signed)
Called and informed patient. Patient verbalized understanding.  

## 2022-08-02 ENCOUNTER — Other Ambulatory Visit (HOSPITAL_COMMUNITY): Payer: Self-pay

## 2022-08-02 ENCOUNTER — Other Ambulatory Visit: Payer: Self-pay

## 2022-08-03 ENCOUNTER — Telehealth: Payer: Self-pay | Admitting: Internal Medicine

## 2022-08-03 ENCOUNTER — Other Ambulatory Visit: Payer: Self-pay

## 2022-08-03 NOTE — Telephone Encounter (Signed)
Error

## 2022-08-08 ENCOUNTER — Ambulatory Visit: Payer: Medicaid Other | Admitting: Internal Medicine

## 2022-08-09 ENCOUNTER — Ambulatory Visit: Payer: Medicaid Other

## 2022-08-09 DIAGNOSIS — L501 Idiopathic urticaria: Secondary | ICD-10-CM

## 2022-08-16 ENCOUNTER — Other Ambulatory Visit: Payer: Self-pay

## 2022-08-16 ENCOUNTER — Ambulatory Visit
Admission: EM | Admit: 2022-08-16 | Discharge: 2022-08-16 | Disposition: A | Discharge status: Home / Self Care | Payer: Medicaid Other | Attending: Physician Assistant | Admitting: Physician Assistant

## 2022-08-16 ENCOUNTER — Other Ambulatory Visit (HOSPITAL_COMMUNITY): Payer: Self-pay

## 2022-08-16 ENCOUNTER — Encounter: Payer: Self-pay | Admitting: Emergency Medicine

## 2022-08-16 DIAGNOSIS — J069 Acute upper respiratory infection, unspecified: Secondary | ICD-10-CM | POA: Diagnosis present

## 2022-08-16 DIAGNOSIS — Z1152 Encounter for screening for COVID-19: Secondary | ICD-10-CM | POA: Insufficient documentation

## 2022-08-16 MED ORDER — PREDNISONE 50 MG PO TABS: ORAL_TABLET | ORAL | 0 refills | Status: DC

## 2022-08-16 NOTE — ED Provider Notes (Signed)
[facility]    CSN: [id number] Arrival date & time: 08/16/22  1726      History   Chief Complaint Chief Complaint  Patient presents with   URI    HPI Donna Gordon is a 42 y.o. female.   Patient complains of a cough and congestion for the past 2 days.  Patient reports body aches and fatigue.  Patient has a past medical history of asthma she has been using her inhaler without relief  The history is provided by the patient. No language interpreter was used.  URI Presenting symptoms: congestion and cough   Onset quality:  Gradual Timing:  Constant Progression:  Worsening Chronicity:  New Relieved by:  Nothing Worsened by:  Nothing Associated symptoms: headaches     Past Medical History:  Diagnosis Date   Asthma    prn inhaler   Chest pain 11/25/2020   with pvc's worked up with holter monitor 11-09-2020  by [facility] echo stress 12-22-2020   Depression    Family history of adverse reaction to anesthesia    maternal grandmother has hx. of being hard to wake up post-op   Migraines    Numbness    last few months being worked up for ms has head mri friday 12-25-2020, numbness right side arm foot and toes several x day   Sciatic nerve pain    right lower back   Skin abnormality    gets whelps all over body for last year none currently pcp to sent pt to dermatology   Uterine polyp 08/2017    Patient Active Problem List   Diagnosis Date Noted   Post-operative state 12/29/2020   Menorrhagia with regular cycle 12/10/2020   Chronic cystitis 12/10/2020   GAD (generalized anxiety disorder) 08/21/2020   Cigarette nicotine dependence without complication 08/21/2020   Pelvic pain in female 11/22/2018   DUB (dysfunctional uterine bleeding) 06/21/2017   S/P primary low transverse C-section 12/02/2015   Irritable bowel syndrome 08/05/2011   Anxiety and depression 02/21/2011   Constipation 10/22/2010    Past Surgical History:  Procedure Laterality  Date   CESAREAN SECTION N/A 12/02/2015   Procedure: CESAREAN SECTION;  Surgeon: Federico Flake, MD;  Location: [facility];  Service: Obstetrics;  Laterality: N/A;   CHOLECYSTECTOMY, LAPAROSCOPIC  10/07/2014   DILATATION & CURETTAGE/HYSTEROSCOPY WITH MYOSURE N/A 09/06/2017   Procedure: DILATATION & CURETTAGE/HYSTEROSCOPY;  Surgeon: Adam Phenix, MD;  Location: [facility];  Service: Gynecology;  Laterality: N/A;   MYOMECTOMY ABDOMINAL APPROACH  08/06/2007   VAGINAL HYSTERECTOMY Bilateral 12/29/2020   Procedure: HYSTERECTOMY VAGINAL WITH LEFT SALPINGECTOMY;  Surgeon: [name], MD;  Location: [facility];  Service: Gynecology;  Laterality: Bilateral;    OB History     Gravida  2   Para  1   Term  1   Preterm  0   AB  1   Living  1      SAB  0   IAB  0   Ectopic  0   Multiple  0   Live Births  1            Home Medications    Prior to Admission medications   Medication Sig Start Date End Date Taking? Authorizing Provider  predniSONE (DELTASONE) 50 MG tablet One tablet a day 08/16/22  Yes Robbie Rideaux K, PA-C  albuterol (PROVENTIL HFA;VENTOLIN HFA) 108 (90 Base) MCG/ACT inhaler Inhale 1 puff into the lungs every 6 (six)  hours as needed for wheezing or shortness of breath.    [provider]  cetirizine (ZYRTEC) 10 MG tablet Take 10 mg by mouth daily. Patient not taking: Reported on 05/09/2022 12/22/21   [provider]  omalizumab [name]) 150 MG/ML prefilled syringe Inject 300 mg into the skin every 28 (twenty-eight) days. 05/11/22   Verlee Monte, MD  drospirenone-ethinyl estradiol (YASMIN 28) 3-0.03 MG tablet Take 1 tablet by mouth daily. Patient not taking: Reported on 06/17/2020 03/09/20 06/17/20  Adam Phenix, MD    Family History Family History  Problem Relation Age of Onset   Lupus Mother    Thyroid cancer Mother    Lupus Sister    Emphysema Maternal Aunt    COPD Maternal Aunt     Cancer Paternal Aunt 36       pancreatic    Cancer Maternal Grandmother 66       breast   Anesthesia problems Maternal Grandmother        hard to wake up post-op   Asthma Paternal Grandmother    COPD Paternal Grandmother    Emphysema Paternal Grandmother    Eczema Daughter    Asthma Daughter    Allergic rhinitis Neg Hx    Atopy Neg Hx    Urticaria Neg Hx    Angioedema Neg Hx     Social History Social History   Tobacco Use   Smoking status: Every Day    Current packs/day: 0.25    Average packs/day: 0.3 packs/day for 21.0 years (5.3 ttl pk-yrs)    Types: Cigarettes    Passive exposure: Never   Smokeless tobacco: Never  Vaping Use   Vaping status: Never Used  Substance Use Topics   Alcohol use: No   Drug use: No     Allergies   Dilaudid [hydromorphone hcl] and Other   Review of Systems Review of Systems  HENT:  Positive for congestion.   Respiratory:  Positive for cough.   Neurological:  Positive for headaches.  All other systems reviewed and are negative.    Physical Exam Triage Vital Signs ED Triage Vitals  Encounter Vitals Group     BP 08/16/22 1736 134/87     Systolic BP Percentile --      Diastolic BP Percentile --      Pulse Rate 08/16/22 1736 78     Resp 08/16/22 1736 16     Temp 08/16/22 1736 98.1 F (36.7 C)     Temp Source 08/16/22 1736 Oral     SpO2 08/16/22 1736 96 %     Weight --      Height --      Head Circumference --      Peak Flow --      Pain Score 08/16/22 1741 7     Pain Loc --      Pain Education --      Exclude from Growth Chart --    No data found.  Updated Vital Signs BP 134/87 (BP Location: Right Arm)   Pulse 78   Temp 98.1 F (36.7 C) (Oral)   Resp 16   LMP 12/18/2020 (Approximate)   SpO2 96%   Visual Acuity Right Eye Distance:   Left Eye Distance:   Bilateral Distance:    Right Eye Near:   Left Eye Near:    Bilateral Near:     Physical Exam Vitals and nursing note reviewed.  Constitutional:       Appearance: She is well-developed.  HENT:     Head: Normocephalic.     Mouth/Throat:     Mouth: Mucous membranes are moist.  Cardiovascular:     Rate and Rhythm: Normal rate.  Pulmonary:     Effort: Pulmonary effort is normal.  Abdominal:     General: There is no distension.  Musculoskeletal:        General: Normal range of motion.     Cervical back: Normal range of motion.  Skin:    General: Skin is warm.  Neurological:     General: No focal deficit present.     Mental Status: She is alert and oriented to person, place, and time.  Psychiatric:        Mood and Affect: Mood normal.      UC Treatments / Results  Labs (all labs ordered are listed, but only abnormal results are displayed) Labs Reviewed  SARS CORONAVIRUS 2 (TAT 6-24 HRS)    EKG   Radiology No results found.  Procedures Procedures (including critical care time)  Medications Ordered in UC Medications - No data to display  Initial Impression / Assessment and Plan / UC Course  I have reviewed the triage vital signs and the nursing notes.  Pertinent labs & imaging results that were available during my care of the patient were reviewed by me and considered in my medical decision making (see chart for details).      Final Clinical Impressions(s) / UC Diagnoses   Final diagnoses:  Viral URI     Discharge Instructions      Return if any problems.    ED Prescriptions     Medication Sig Dispense Auth. Provider   predniSONE (DELTASONE) 50 MG tablet One tablet a day 5 tablet Elson Areas, [place]      PDMP not reviewed this encounter. An After Visit Summary was printed and given to the patient.       Elson Areas, [place] 08/16/22 [id number]

## 2022-08-16 NOTE — ED Triage Notes (Signed)
Patient presents to [facility] for evaluation of cough, headache, body aches, runny nose, irritated throat

## 2022-08-16 NOTE — Discharge Instructions (Addendum)
Return if any problems.

## 2022-08-25 ENCOUNTER — Other Ambulatory Visit (HOSPITAL_COMMUNITY): Payer: Self-pay

## 2022-08-30 ENCOUNTER — Other Ambulatory Visit (HOSPITAL_COMMUNITY): Payer: Self-pay

## 2022-08-31 ENCOUNTER — Other Ambulatory Visit: Payer: Self-pay

## 2022-09-20 ENCOUNTER — Other Ambulatory Visit (HOSPITAL_COMMUNITY): Payer: Self-pay

## 2022-10-20 ENCOUNTER — Other Ambulatory Visit: Payer: Self-pay

## 2022-10-20 NOTE — Progress Notes (Signed)
Specialty Pharmacy Refill Coordination Note  Donna Gordon is a 42 y.o. female contacted today regarding refills of specialty medication(s) Omalizumab   Patient requested Delivery   Delivery date: 11/01/22   Verified address: [address], [place], [address]   Medication will be filled on 10/31/22.

## 2022-10-24 ENCOUNTER — Other Ambulatory Visit: Payer: Self-pay

## 2022-10-29 ENCOUNTER — Ambulatory Visit (INDEPENDENT_AMBULATORY_CARE_PROVIDER_SITE_OTHER): Payer: Medicaid Other

## 2022-10-29 ENCOUNTER — Ambulatory Visit
Admission: EM | Admit: 2022-10-29 | Discharge: 2022-10-29 | Disposition: A | Discharge status: Home / Self Care | Payer: Medicaid Other | Attending: Internal Medicine | Admitting: Internal Medicine

## 2022-10-29 DIAGNOSIS — J449 Chronic obstructive pulmonary disease, unspecified: Secondary | ICD-10-CM | POA: Diagnosis not present

## 2022-10-29 DIAGNOSIS — B349 Viral infection, unspecified: Secondary | ICD-10-CM

## 2022-10-29 DIAGNOSIS — R062 Wheezing: Secondary | ICD-10-CM

## 2022-10-29 DIAGNOSIS — J4521 Mild intermittent asthma with (acute) exacerbation: Secondary | ICD-10-CM

## 2022-10-29 DIAGNOSIS — R051 Acute cough: Secondary | ICD-10-CM

## 2022-10-29 MED ORDER — BENZONATATE 200 MG PO CAPS: 200.0000 mg | ORAL_CAPSULE | Freq: Three times a day (TID) | ORAL | 0 refills | Status: AC | PRN

## 2022-10-29 MED ORDER — IPRATROPIUM BROMIDE 0.03 % NA SOLN: 2.0000 | Freq: Two times a day (BID) | NASAL | 0 refills | Status: AC

## 2022-10-29 MED ORDER — IPRATROPIUM-ALBUTEROL 0.5-2.5 (3) MG/3ML IN SOLN
3.0000 mL | Freq: Once | RESPIRATORY_TRACT | Status: AC
  Administered 2022-10-29: 3 mL via RESPIRATORY_TRACT

## 2022-10-29 MED ORDER — AZITHROMYCIN 250 MG PO TABS: 250.0000 mg | ORAL_TABLET | Freq: Every day | ORAL | 0 refills | Status: AC

## 2022-10-29 MED ORDER — PREDNISONE 20 MG PO TABS: 40.0000 mg | ORAL_TABLET | Freq: Every day | ORAL | 0 refills | Status: AC

## 2022-10-29 NOTE — Discharge Instructions (Addendum)
Start Tessalon as needed for cough.  Prednisone daily for the next 5 days.  Continue your albuterol inhaler as needed.  Atrovent nasal spray as needed for congestion.  Lots of rest and fluids.  Please follow-up with your PCP 2 to 3 days for recheck.  Please go to the ER for any worsening symptoms.  I hope you feel better soon!

## 2022-10-29 NOTE — ED Triage Notes (Addendum)
Patient presents with headache, coughing up blood and states she has gurgling in chest x 4 days. Treated with cold and flu, decongestant OTC medications w/o relief.

## 2022-10-29 NOTE — ED Provider Notes (Addendum)
[facility]    CSN: [id number] Arrival date & time: 10/29/22  0854      History   Chief Complaint Chief Complaint  Patient presents with   Cough    HPI Donna Gordon is a 42 y.o. female  presents for evaluation of URI symptoms for 4 days. Patient reports associated symptoms of cough, congestion, postnasal drip, wheezing, body aches. Denies N/V/D, fevers, sore throat, ear pain, shortness of breath. Patient does have a hx of asthma.  Has an albuterol inhaler last used 2 days ago.  Patient does have a history of smoking.  Reports no sick contacts.  Pt has taken cough medicine/decongestants OTC for symptoms. Pt has no other concerns at this time.    Cough Associated symptoms: wheezing     Past Medical History:  Diagnosis Date   Asthma    prn inhaler   Chest pain 11/25/2020   with pvc's worked up with holter monitor 11-09-2020  by [facility] echo stress 12-22-2020   Depression    Family history of adverse reaction to anesthesia    maternal grandmother has hx. of being hard to wake up post-op   Migraines    Numbness    last few months being worked up for ms has head mri friday 12-25-2020, numbness right side arm foot and toes several x day   Sciatic nerve pain    right lower back   Skin abnormality    gets whelps all over body for last year none currently pcp to sent pt to dermatology   Uterine polyp 08/2017    Patient Active Problem List   Diagnosis Date Noted   Post-operative state 12/29/2020   Menorrhagia with regular cycle 12/10/2020   Chronic cystitis 12/10/2020   GAD (generalized anxiety disorder) 08/21/2020   Cigarette nicotine dependence without complication 08/21/2020   Pelvic pain in female 11/22/2018   DUB (dysfunctional uterine bleeding) 06/21/2017   S/P primary low transverse C-section 12/02/2015   Irritable bowel syndrome 08/05/2011   Anxiety and depression 02/21/2011   Constipation 10/22/2010    Past Surgical History:   Procedure Laterality Date   CESAREAN SECTION N/A 12/02/2015   Procedure: CESAREAN SECTION;  Surgeon: Federico Flake, MD;  Location: [facility];  Service: Obstetrics;  Laterality: N/A;   CHOLECYSTECTOMY, LAPAROSCOPIC  10/07/2014   DILATATION & CURETTAGE/HYSTEROSCOPY WITH MYOSURE N/A 09/06/2017   Procedure: DILATATION & CURETTAGE/HYSTEROSCOPY;  Surgeon: Adam Phenix, MD;  Location: [facility];  Service: Gynecology;  Laterality: N/A;   MYOMECTOMY ABDOMINAL APPROACH  08/06/2007   VAGINAL HYSTERECTOMY Bilateral 12/29/2020   Procedure: HYSTERECTOMY VAGINAL WITH LEFT SALPINGECTOMY;  Surgeon: [name], MD;  Location: [facility];  Service: Gynecology;  Laterality: Bilateral;    OB History     Gravida  2   Para  1   Term  1   Preterm  0   AB  1   Living  1      SAB  0   IAB  0   Ectopic  0   Multiple  0   Live Births  1            Home Medications    Prior to Admission medications   Medication Sig Start Date End Date Taking? Authorizing Provider  azithromycin (ZITHROMAX) 250 MG tablet Take 1 tablet (250 mg total) by mouth daily. Take first 2 tablets together, then 1 every day until finished. 10/29/22  Yes Radford Pax, NP  benzonatate (TESSALON) 200 MG capsule Take 1 capsule (200 mg total) by mouth 3 (three) times daily as needed. 10/29/22  Yes Radford Pax, NP  ipratropium (ATROVENT) 0.03 % nasal spray Place 2 sprays into both nostrils every 12 (twelve) hours. 10/29/22  Yes Radford Pax, NP  predniSONE (DELTASONE) 20 MG tablet Take 2 tablets (40 mg total) by mouth daily with breakfast for 5 days. 10/29/22 11/03/22 Yes Radford Pax, NP  albuterol (PROVENTIL HFA;VENTOLIN HFA) 108 (90 Base) MCG/ACT inhaler Inhale 1 puff into the lungs every 6 (six) hours as needed for wheezing or shortness of breath.    [provider]  cetirizine (ZYRTEC) 10 MG tablet Take 10 mg by mouth daily. Patient not taking: Reported  on 05/09/2022 12/22/21   [provider]  omalizumab [name]) 150 MG/ML prefilled syringe Inject 300 mg into the skin every 28 (twenty-eight) days. 05/11/22   Verlee Monte, MD  drospirenone-ethinyl estradiol (YASMIN 28) 3-0.03 MG tablet Take 1 tablet by mouth daily. Patient not taking: Reported on 06/17/2020 03/09/20 06/17/20  Adam Phenix, MD    Family History Family History  Problem Relation Age of Onset   Lupus Mother    Thyroid cancer Mother    Lupus Sister    Emphysema Maternal Aunt    COPD Maternal Aunt    Cancer Paternal Aunt 62       pancreatic    Cancer Maternal Grandmother 83       breast   Anesthesia problems Maternal Grandmother        hard to wake up post-op   Asthma Paternal Grandmother    COPD Paternal Grandmother    Emphysema Paternal Grandmother    Eczema Daughter    Asthma Daughter    Allergic rhinitis Neg Hx    Atopy Neg Hx    Urticaria Neg Hx    Angioedema Neg Hx     Social History Social History   Tobacco Use   Smoking status: Every Day    Current packs/day: 0.25    Average packs/day: 0.3 packs/day for 21.0 years (5.3 ttl pk-yrs)    Types: Cigarettes    Passive exposure: Never   Smokeless tobacco: Never  Vaping Use   Vaping status: Never Used  Substance Use Topics   Alcohol use: No   Drug use: No     Allergies   Dilaudid [hydromorphone hcl] and Other   Review of Systems Review of Systems  HENT:  Positive for congestion.   Respiratory:  Positive for cough and wheezing.      Physical Exam Triage Vital Signs ED Triage Vitals  Encounter Vitals Group     BP 10/29/22 0906 127/84     Systolic BP Percentile --      Diastolic BP Percentile --      Pulse Rate 10/29/22 0906 90     Resp --      Temp 10/29/22 0906 98.1 F (36.7 C)     Temp Source 10/29/22 0906 Oral     SpO2 10/29/22 0906 98 %     Weight 10/29/22 0905 130 lb (59 kg)     Height 10/29/22 0905 5\' 5"  (1.651 m)     Head Circumference --      Peak Flow --      Pain  Score 10/29/22 0905 3     Pain Loc --      Pain Education --      Exclude from Growth Chart --  No data found.  Updated Vital Signs BP 127/84 (BP Location: Right Arm)   Pulse 90   Temp 98.1 F (36.7 C) (Oral)   Ht 5\' 5"  (1.651 m)   Wt 130 lb (59 kg)   LMP 12/18/2020 (Approximate)   SpO2 98%   BMI 21.63 kg/m   Visual Acuity Right Eye Distance:   Left Eye Distance:   Bilateral Distance:    Right Eye Near:   Left Eye Near:    Bilateral Near:     Physical Exam Vitals and nursing note reviewed.  Constitutional:      General: She is not in acute distress.    Appearance: She is well-developed. She is not ill-appearing.  HENT:     Head: Normocephalic and atraumatic.     Right Ear: Tympanic membrane and ear canal normal.     Left Ear: Tympanic membrane and ear canal normal.     Nose: Congestion present.     Mouth/Throat:     Mouth: Mucous membranes are moist.     Pharynx: Oropharynx is clear. Uvula midline. No posterior oropharyngeal erythema.     Tonsils: No tonsillar exudate or tonsillar abscesses.  Eyes:     Conjunctiva/sclera: Conjunctivae normal.     Pupils: Pupils are equal, round, and reactive to light.  Cardiovascular:     Rate and Rhythm: Normal rate and regular rhythm.     Heart sounds: Normal heart sounds.  Pulmonary:     Effort: Pulmonary effort is normal.     Breath sounds: Wheezing present.  Musculoskeletal:     Cervical back: Normal range of motion and neck supple.  Lymphadenopathy:     Cervical: No cervical adenopathy.  Skin:    General: Skin is warm and dry.  Neurological:     General: No focal deficit present.     Mental Status: She is alert and oriented to person, place, and time.  Psychiatric:        Mood and Affect: Mood normal.        Behavior: Behavior normal.      UC Treatments / Results  Labs (all labs ordered are listed, but only abnormal results are displayed) Labs Reviewed - No data to display  EKG   Radiology DG Chest 2  View  Result Date: 10/29/2022 CLINICAL DATA:  Cough.  Wheezing.  Hemoptysis. EXAM: CHEST - 2 VIEW COMPARISON:  05/05/2021 and 01/19/2011 FINDINGS: Heart size remains normal. Prominent epicardial fat pad in the right cardiophrenic angle remains stable. Tenting of right hemidiaphragm again noted, consistent with mild scarring. Pulmonary hyperinflation again seen, consistent with COPD. No evidence of acute infiltrate or edema. No evidence of pleural effusion. IMPRESSION: COPD and right basilar scarring. No active disease. Electronically Signed   By: Danae Orleans M.D.   On: 10/29/2022 10:11    Procedures Procedures (including critical care time)  Medications Ordered in UC Medications  ipratropium-albuterol (DUONEB) 0.5-2.5 (3) MG/3ML nebulizer solution 3 mL (3 mLs Nebulization Given 10/29/22 0920)    Initial Impression / Assessment and Plan / UC Course  I have reviewed the triage vital signs and the nursing notes.  Pertinent labs & imaging results that were available during my care of the patient were reviewed by me and considered in my medical decision making (see chart for details).  Clinical Course as of 10/29/22 1107  Sat Oct 29, 2022  1006 Respiratory rate 20 [JM]    Clinical Course User Index [JM] Radford Pax, NP    Reviewed  exam and symptoms with patient.  No red flags.  Pt declined COVID testing. Wheezing improved after nebulizer. Wet read of CXR without consolidation. Will contact patient with any abnormal results with radiology over read. Prednisone daily. Atrovent for nasal congestion. Tessalon as needed for cough. Pt to continue her albuterol inhaler as needed.  Advised PCP follow-up 2 to 3 days for recheck.  Strict ER precautions reviewed and patient verbalized understanding.  Addendum 1105: Contacted patient regarding radiology over read of x-ray, DOB verified.   No infiltrate but does show signs of COPD.  Patient denies history of this but states she has had a chronic cough  for 1 year.  Will add on azithromycin to her treatment as she is already been prescribed prednisone and cough medicine.  Patient states she sent a message to her PCP to set up an appointment for further workup of her symptoms.  ER precautions were reiterated and patient verbalized understanding. Final Clinical Impressions(s) / UC Diagnoses   Final diagnoses:  Wheezing  Mild intermittent asthma with acute exacerbation  Acute cough  Viral illness  Chronic obstructive pulmonary disease, unspecified COPD type [facility])     Discharge Instructions      Start Tessalon as needed for cough.  Prednisone daily for the next 5 days.  Continue your albuterol inhaler as needed.  Atrovent nasal spray as needed for congestion.  Lots of rest and fluids.  Please follow-up with your PCP 2 to 3 days for recheck.  Please go to the ER for any worsening symptoms.  I hope you feel better soon!     ED Prescriptions     Medication Sig Dispense Auth. Provider   predniSONE (DELTASONE) 20 MG tablet Take 2 tablets (40 mg total) by mouth daily with breakfast for 5 days. 10 tablet Radford Pax, NP   benzonatate (TESSALON) 200 MG capsule Take 1 capsule (200 mg total) by mouth 3 (three) times daily as needed. 20 capsule Radford Pax, NP   ipratropium (ATROVENT) 0.03 % nasal spray Place 2 sprays into both nostrils every 12 (twelve) hours. 30 mL Radford Pax, NP   azithromycin (ZITHROMAX) 250 MG tablet Take 1 tablet (250 mg total) by mouth daily. Take first 2 tablets together, then 1 every day until finished. 6 tablet Radford Pax, NP      PDMP not reviewed this encounter.   Radford Pax, NP 10/29/22 1008    Radford Pax, NP 10/29/22 [phone]

## 2022-11-21 ENCOUNTER — Other Ambulatory Visit: Payer: Self-pay

## 2022-11-23 ENCOUNTER — Other Ambulatory Visit: Payer: Self-pay

## 2022-11-23 NOTE — Progress Notes (Signed)
Specialty Pharmacy Refill Coordination Note  Donna Gordon is a 42 y.o. female contacted today regarding refills of specialty medication(s) Omalizumab   Patient requested Delivery   Delivery date: 12/02/22   Verified address: [address], [place], [address]   Medication will be filled on 12/01/22.

## 2022-11-23 NOTE — Progress Notes (Signed)
Specialty Pharmacy Ongoing Clinical Assessment Note  Donna Gordon is a 42 y.o. female who is being followed by the specialty pharmacy service for RxSp Allergy   Patient's specialty medication(s) reviewed today: Omalizumab   Missed doses in the last 4 weeks: 0   Patient/Caregiver did not have any additional questions or concerns.   Therapeutic benefit summary: Patient is achieving benefit   Adverse events/side effects summary: No adverse events/side effects   Patient's therapy is appropriate to: Continue    Goals Addressed             This Visit's Progress    Reduce signs and symptoms       Patient is on track. Patient will maintain adherence         Follow up:  6 months  Otto Herb Specialty Pharmacist

## 2022-12-13 IMAGING — US US EXTREM LOW VENOUS*L*
1 series · 13 of 24 positions shown · non-contrast
Comparison: None Available.

CLINICAL DATA: Left lower extremity pain and edema.



[Series 1: us extrem low venous*left* · 13 of 44 slices shown]
[im 1/44]
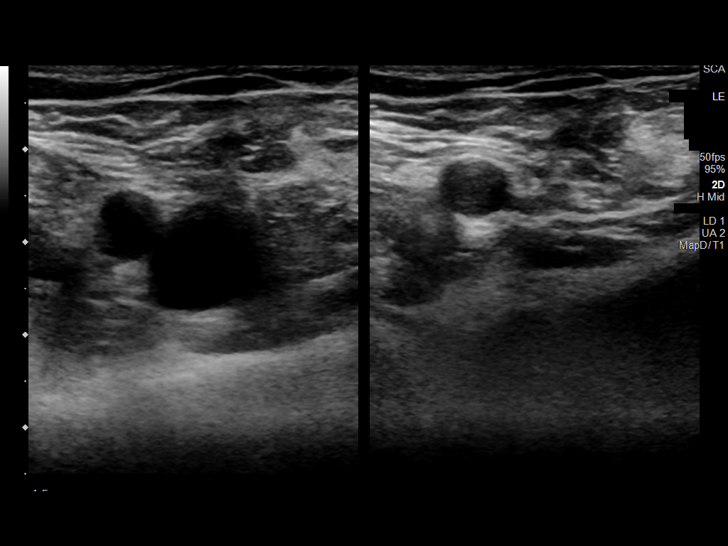
[im 4/44]
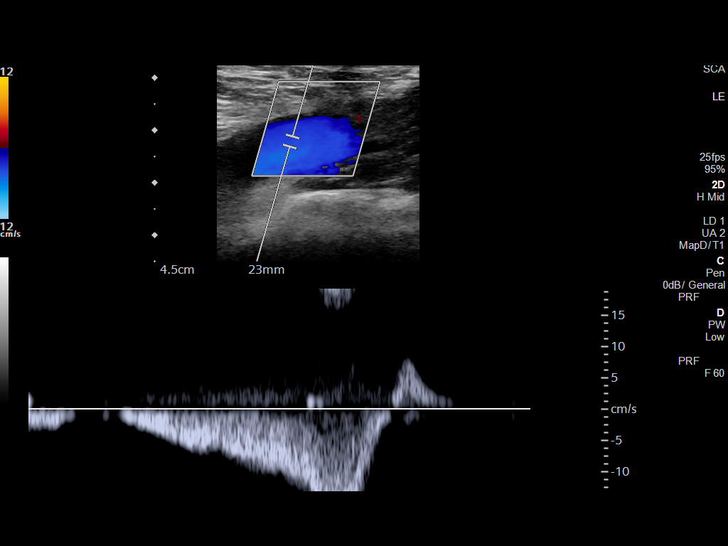
[im 8/44]
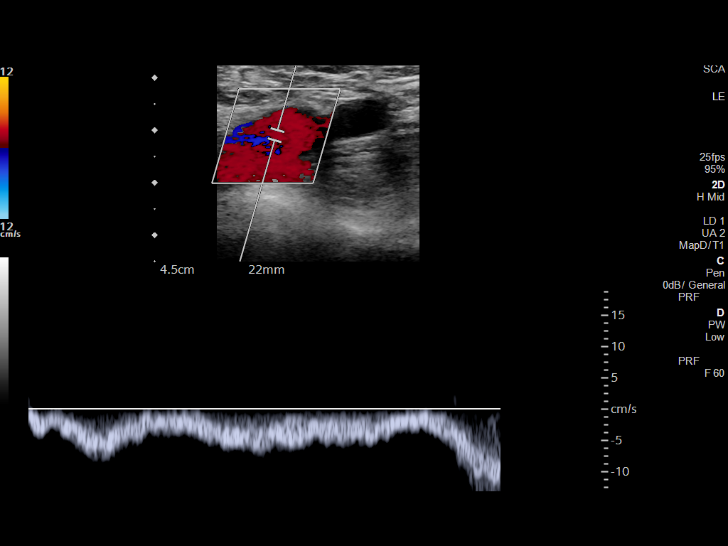
[im 12/44]
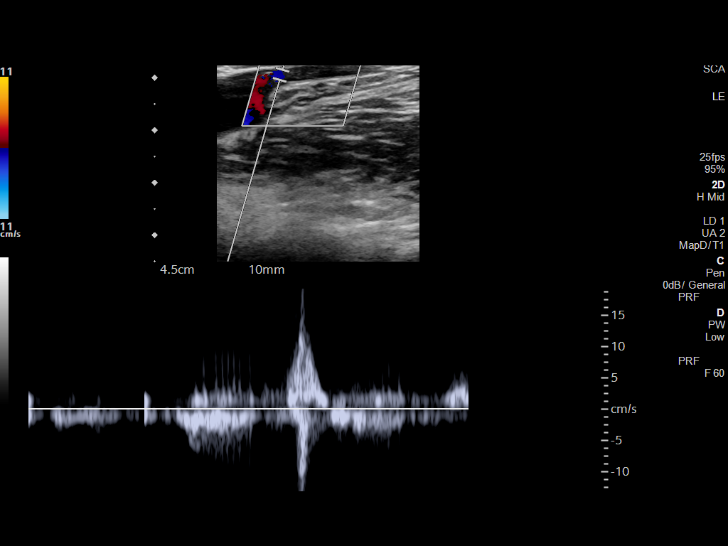
[im 15/44]
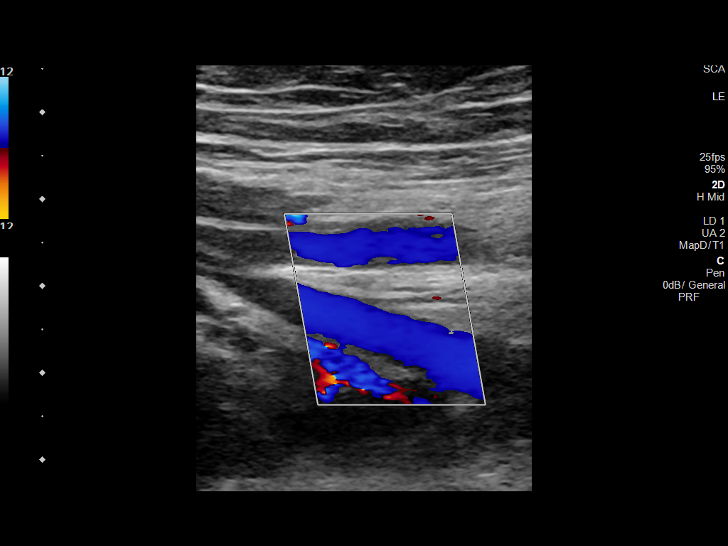
[im 19/44]
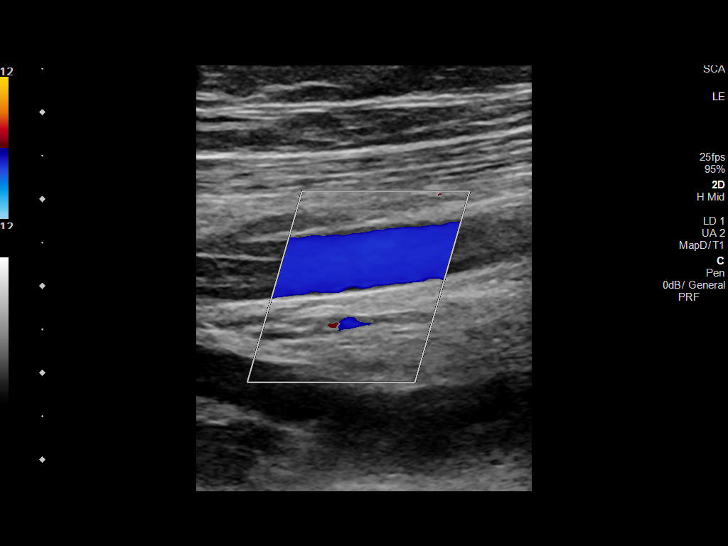
[im 23/44]
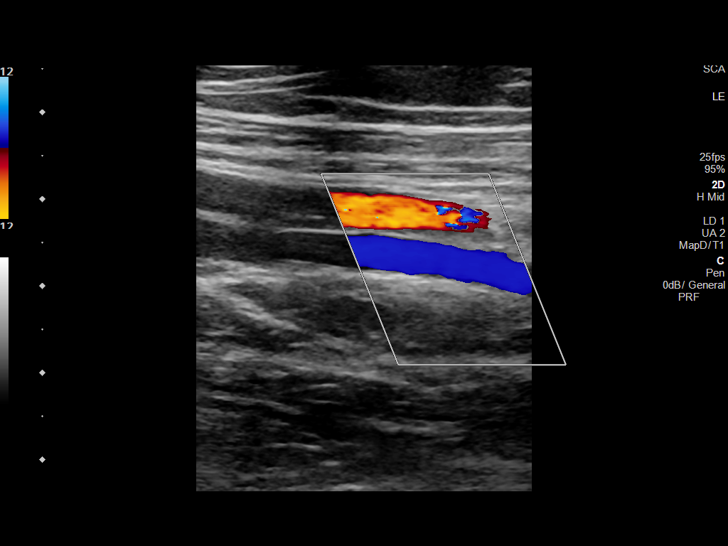
[im 25/44]
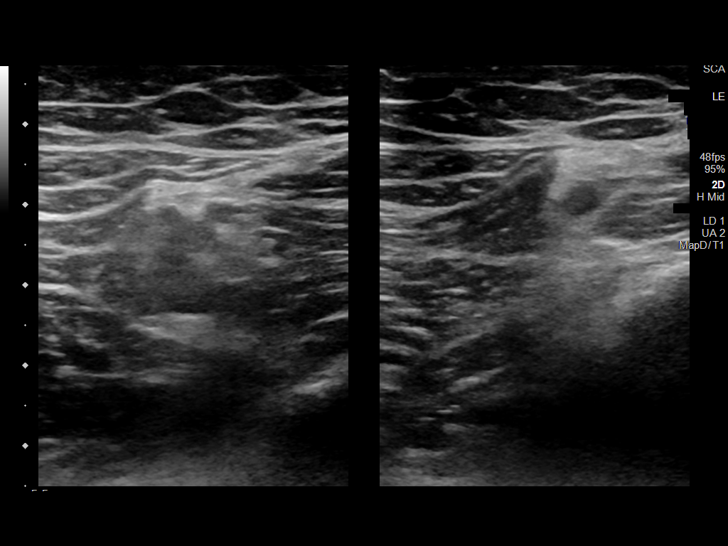
[im 29/44]
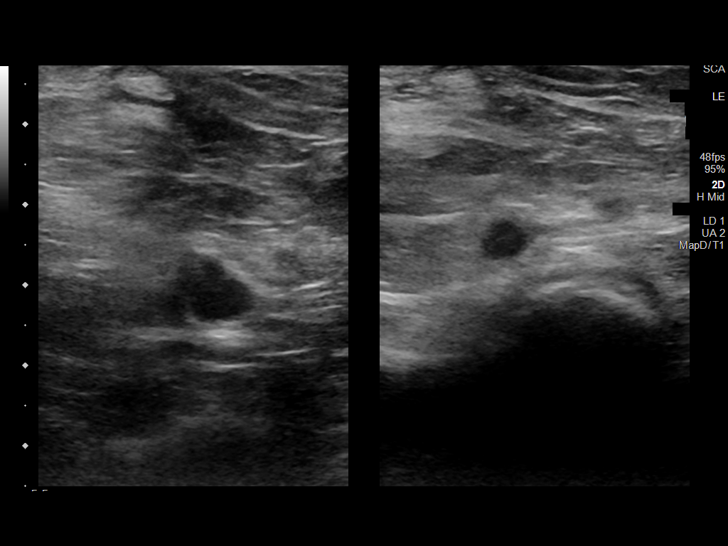
[im 32/44]
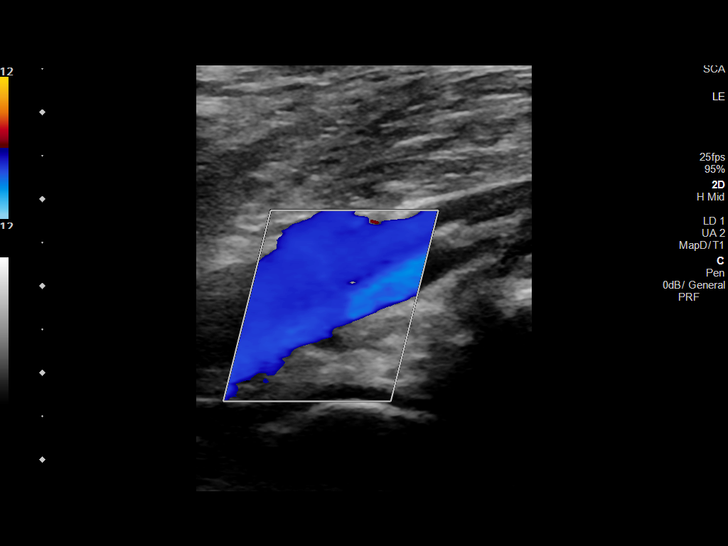
[im 36/44]
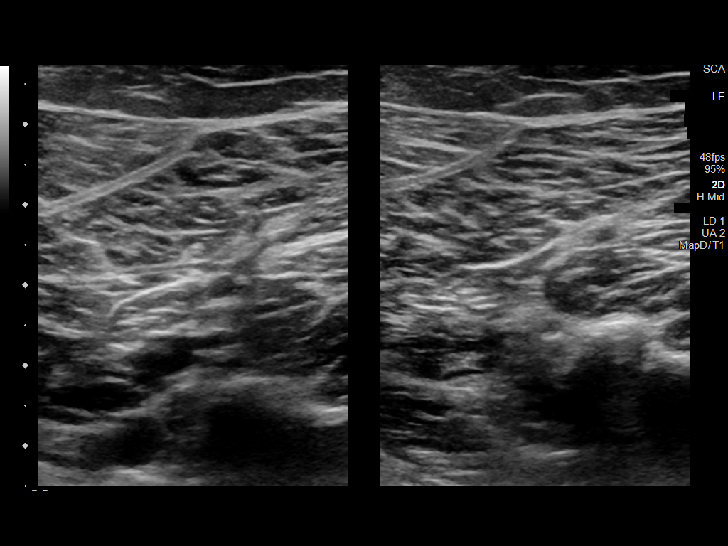
[im 40/44]
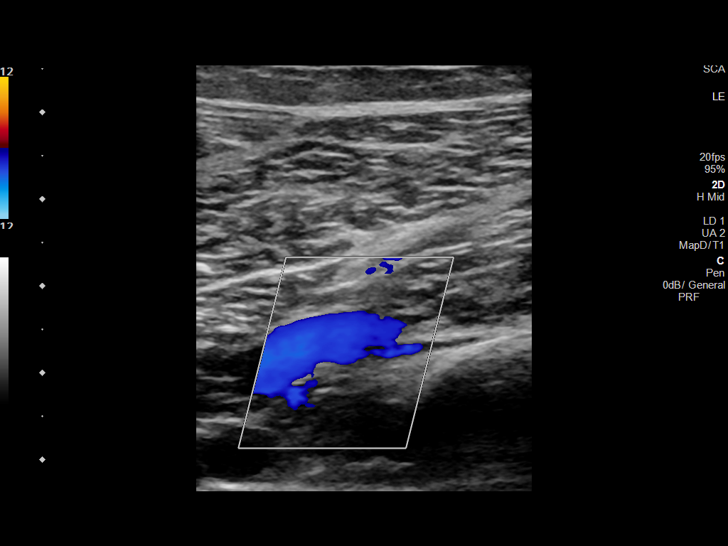
[im 44/44]
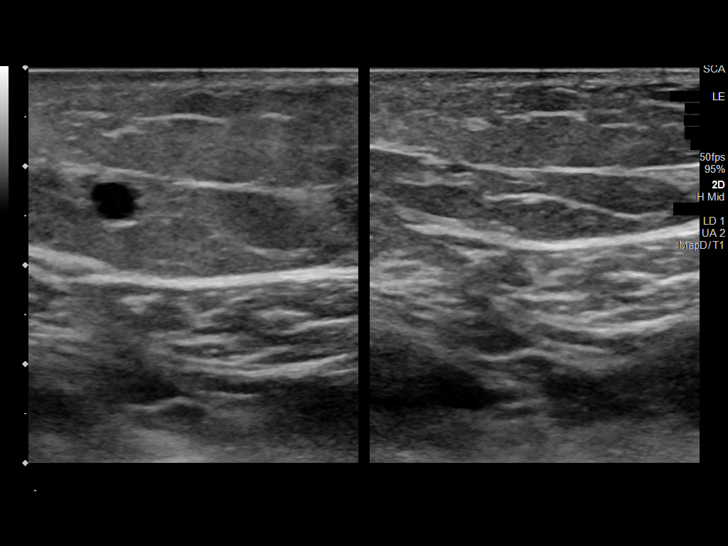

[13 of 24 positions shown; findings below may reference images not displayed]

FINDINGS: Contralateral Common Femoral Vein: Respiratory phasicity is normal
and symmetric with the symptomatic side. No evidence of thrombus.
Normal compressibility.

Common Femoral Vein: No evidence of thrombus. Normal
compressibility, respiratory phasicity and response to augmentation.

Saphenofemoral Junction: No evidence of thrombus. Normal
compressibility and flow on color Doppler imaging.

Profunda Femoral Vein: No evidence of thrombus. Normal
compressibility and flow on color Doppler imaging.

Femoral Vein: No evidence of thrombus. Normal compressibility,
respiratory phasicity and response to augmentation.

Popliteal Vein: No evidence of thrombus. Normal compressibility,
respiratory phasicity and response to augmentation.

Calf Veins: No evidence of thrombus. Normal compressibility and flow
on color Doppler imaging.

Superficial Great Saphenous Vein: No evidence of thrombus. Normal
compressibility.

Venous Reflux:  None.

Other Findings: No evidence of superficial thrombophlebitis or
abnormal fluid collection.
IMPRESSION: No evidence of left lower extremity deep venous thrombosis.

## 2022-12-20 ENCOUNTER — Other Ambulatory Visit: Payer: Self-pay

## 2022-12-27 ENCOUNTER — Other Ambulatory Visit: Payer: Self-pay

## 2022-12-28 ENCOUNTER — Other Ambulatory Visit: Payer: Self-pay

## 2022-12-28 NOTE — Progress Notes (Signed)
Clinical Intervention Note  Clinical Intervention Notes: Pt reports dull needle with last injection of Xolair. Pt was able to inject all medication into thigh after a few attempts. Pt reports brusing in the area. Pt has not had any issues prior to this injection. Recommended pt to avoid administration in that area for next injection and to call pharmacy if she has any futher issues with the injection. Pt expressed understanding. Closing Intervention.   Clinical Intervention Outcomes: Improved therapy adherence   Bobette Mo Specialty Pharmacist

## 2022-12-28 NOTE — Progress Notes (Signed)
Specialty Pharmacy Refill Coordination Note  Donna Gordon is a 42 y.o. female contacted today regarding refills of specialty medication(s) Omalizumab [name])   Patient requested Delivery   Delivery date: 01/03/23   Verified address: [address], [place], [address]   Medication will be filled on 12.23.24.

## 2023-01-02 ENCOUNTER — Other Ambulatory Visit: Payer: Self-pay

## 2023-01-24 ENCOUNTER — Other Ambulatory Visit: Payer: Self-pay

## 2023-01-24 NOTE — Progress Notes (Signed)
 Specialty Pharmacy Refill Coordination Note  Donna Gordon is a 43 y.o. female contacted today regarding refills of specialty medication(s) Omalizumab  (XOLAIR )   Patient requested Delivery   Delivery date: 02/02/23   Verified address: [address] [place], [address]   Medication will be filled on 02/01/23.

## 2023-01-30 ENCOUNTER — Institutional Professional Consult (permissible substitution): Payer: Medicaid Other | Admitting: Internal Medicine

## 2023-02-01 ENCOUNTER — Other Ambulatory Visit: Payer: Self-pay

## 2023-02-23 ENCOUNTER — Other Ambulatory Visit: Payer: Self-pay

## 2023-02-23 NOTE — Progress Notes (Signed)
Specialty Pharmacy Refill Coordination Note  Donna Gordon is a 43 y.o. female contacted today regarding refills of specialty medication(s) Omalizumab [name])   Patient requested Delivery   Delivery date: 03/02/23   Verified address: [address], [place], [address]   Medication will be filled on 02.19.25.

## 2023-02-28 ENCOUNTER — Other Ambulatory Visit: Payer: Self-pay
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
## 2023-03-27 ENCOUNTER — Other Ambulatory Visit: Payer: Self-pay

## 2023-03-29 ENCOUNTER — Other Ambulatory Visit: Payer: Self-pay

## 2023-03-30 ENCOUNTER — Other Ambulatory Visit: Payer: Self-pay

## 2023-03-30 NOTE — Progress Notes (Signed)
 Specialty Pharmacy Refill Coordination Note  Donna Gordon is a 43 y.o. female contacted today regarding refills of specialty medication(s) Omalizumab [name])   Patient requested Delivery   Delivery date: 04/04/23   Verified address: [address], [place], [address]   Medication will be filled on 04/03/23.

## 2023-04-03 ENCOUNTER — Other Ambulatory Visit: Payer: Self-pay

## 2023-04-26 ENCOUNTER — Other Ambulatory Visit: Payer: Self-pay

## 2023-04-26 ENCOUNTER — Other Ambulatory Visit: Payer: Self-pay | Admitting: Pharmacy Technician

## 2023-04-26 NOTE — Progress Notes (Signed)
 Specialty Pharmacy Refill Coordination Note  Donna Gordon is a 43 y.o. female contacted today regarding refills of specialty medication(s) Omalizumab [name]   Patient requested (Patient-Rptd) Delivery   Delivery date: (Patient-Rptd) 05/04/23   Verified address: (Patient-Rptd) [address] [place] [address]   Medication will be filled on 05/03/23.

## 2023-04-27 NOTE — Patient Instructions (Signed)
 Hives (urticaria) Take the least amount of medications while remaining high free Cetirizine (Zyrtec) 10mg  twice a day and famotidine (Pepcid) 20 mg twice a day. If no symptoms for 7-14 days then decrease to. Cetirizine (Zyrtec) 10mg  twice a day and famotidine (Pepcid) 20 mg once a day.  If no symptoms for 7-14 days then decrease to. Cetirizine (Zyrtec) 10mg  twice a day.  If no symptoms for 7-14 days then decrease to. Cetirizine (Zyrtec) 10mg  once a day.  May use Benadryl  (diphenhydramine ) as needed for breakthrough hives       If symptoms return, then step up dosage Continue Xolair  injections 300 mg once every 4 weeks epinephrine  autoinjector set per protocol Keep a detailed symptom journal including foods eaten, contact with allergens, medications taken, weather changes.    Chronic rhinitis Begin an antihistamine once a day if needed for a runny nose or itch Begin Flonase 2 sprays in each nostril once a day if needed for a stuffy nose.  In the right nostril, point the applicator out toward the right ear. In the left nostril, point the applicator out toward the left ear Consider saline nasal rinses as needed for nasal symptoms. Use this before any medicated nasal sprays for best result Consider updating your environmental allergy testing.  Remember to stop antihistamines for 3 days before your testing appointment  Asthma/COPD Continue to follow-up with your pulmonology specialist as recommended  Tobacco use At down using tobacco products and best to quit  Call the clinic if this treatment plan is not working well for you.  Follow up in 6 months or sooner if needed.

## 2023-04-27 NOTE — Progress Notes (Addendum)
 [address] [place] [address] Dept: [phone]  FOLLOW UP NOTE  Patient ID: Donna Gordon, female    DOB: 02/07/1980  Age: 43 y.o. MRN: [id number] Date of Office Visit: 05/01/2023  Assessment  Chief Complaint: Urticaria  HPI Donna Gordon is a 43 year old female who presents to the clinic for a follow-up visit.  She was last seen in this clinic on 05/09/2018 for by Dr. Cornel Diesel for evaluation of chronic urticaria on Xolair .  Her current problem list includes asthma/COPD for which she follows a pulmonary specialist, history of thyroid nodules with last ultrasound on 04/12/2023, and tobacco use.  At today's visit, she reports her chronic urticaria has been well-controlled with no hive outbreaks since her last visit to this clinic.  She is not currently taking an antihistamine or famotidine at this time.  She continues home administration of Xolair  300 mg once every 4 weeks with no large or local reactions.  She reports a significant decrease in her symptoms of chronic urticaria while continuing on Xolair  injections.  EpiPen  set is out of date and will be reordered at today's visit.  Asthma is reported as moderately well-controlled with cough producing mucus as the main symptom.  She denies shortness of breath or wheeze with activity or rest.  She continues to follow-up with her pulmonology team as recommended.  She reports that she has significantly cut down on tobacco use, however, occasionally sneaks a cigarette or vape.  Rhinitis is reported as poorly controlled with symptoms including rhinorrhea, nasal congestion, sneezing, and postnasal drainage.  She continues Benadryl  if needed with moderate relief of symptoms.  She is not currently using a nasal steroid or nasal saline rinse.  No current environmental allergy testing available for review.  Her current medications are listed in the chart.  Results reviewed: 04/12/2023 11:42 AM EDT  THYROID ULTRASOUND INDICATION: Thyroid  nodule COMPARISON: None TECHNIQUE: Gray-scale and color Doppler images of the thyroid gland were obtained. FINDINGS:   ISTHMUS: -Size:  0.17 cm.   RIGHT LOBE: -Size: 1.08 x 1.37 x 4+ cm. -Echogenicity:  homogenous -Vascularity:  normal  LEFT LOBE: -Size: 1.04 x 2.00 x 4+ cm. -Echogenicity:  homogenous -Vascularity:  normal    NODULES:  2   -Nodule #1 -- Side:  Right -- Size: 0.66 x 1.10 x 1.16 cm. -- Location:  Mid/upper   -- Composition: Mixed cystic and solid                     (1) -- Echogenicity: Hypoechoic                        (2) -- Shape: Wider than tall      (0) -- Margins: Smooth                                (0) -- Echogenic foci: Punctate echogenic foci                    (3)   -- ACR TI-RADS total points and risk category: 6 Points - TR4 - Moderately Suspicious  -Nodule #2 -- Side:  Left -- Size: 0.41 x 0.47 x 0.56 cm. -- Location:  Mid   -- Composition: Solid or almost completely solid     (2) -- Echogenicity: Very hypoechoic                (  3) -- Shape: Wider than tall      (0) -- Margins: Smooth                                (0) -- Echogenic foci: Peripheral (rim) calcifications            (2)   -- ACR TI-RADS total points and risk category: 7 Points - TR5 - Highly Suspicious  MISCELLANEOUS: N/A.  IMPRESSION: Normal appearing thyroid.  One nodule in each thyroid lobe.    Drug Allergies:  Allergies  Allergen Reactions   Dilaudid  [Hydromorphone  Hcl] Itching   Other Hives    SWEET AND SOUR SAUCE    Physical Exam: BP 128/80 (BP Location: Left Arm, Patient Position: Sitting, Cuff Size: Normal)   Pulse 89   Temp 98.1 F (36.7 C) (Temporal)   Resp 16   Ht 5' 4.17" (1.63 m)   Wt 145 lb 14.4 oz (66.2 kg)   LMP 12/18/2020 (Approximate)   SpO2 99%   BMI 24.91 kg/m    Physical Exam Vitals reviewed.  Constitutional:      Appearance: Normal appearance.  HENT:     Head: Normocephalic and atraumatic.     Right Ear: Tympanic membrane  normal.     Left Ear: Tympanic membrane normal.     Nose:     Comments: Bilateral nares slightly erythematous with thin clear nasal drainage noted.  Pharynx normal.  Ears normal.  Eyes normal.    Mouth/Throat:     Pharynx: Oropharynx is clear.  Eyes:     Conjunctiva/sclera: Conjunctivae normal.  Cardiovascular:     Rate and Rhythm: Normal rate and regular rhythm.     Heart sounds: Normal heart sounds. No murmur heard. Pulmonary:     Effort: Pulmonary effort is normal.     Breath sounds: Normal breath sounds.     Comments: Lungs clear to auscultation Musculoskeletal:        General: Normal range of motion.     Cervical back: Normal range of motion and neck supple.  Skin:    General: Skin is warm and dry.  Neurological:     Mental Status: She is alert and oriented to person, place, and time.  Psychiatric:        Mood and Affect: Mood normal.        Behavior: Behavior normal.        Thought Content: Thought content normal.        Judgment: Judgment normal.     Assessment and Plan: 1. Chronic urticaria   2. Mild intermittent asthma without complication   3. Tobacco use   4. Chronic rhinitis    Patient instructions: Hives (urticaria) Take the least amount of medications while remaining high free Cetirizine (Zyrtec) 10mg  twice a day and famotidine (Pepcid) 20 mg twice a day. If no symptoms for 7-14 days then decrease to. Cetirizine (Zyrtec) 10mg  twice a day and famotidine (Pepcid) 20 mg once a day.  If no symptoms for 7-14 days then decrease to. Cetirizine (Zyrtec) 10mg  twice a day.  If no symptoms for 7-14 days then decrease to. Cetirizine (Zyrtec) 10mg  once a day.  May use Benadryl  (diphenhydramine ) as needed for breakthrough hives       If symptoms return, then step up dosage Continue Xolair  injections 300 mg once every 4 weeks epinephrine  autoinjector set per protocol Keep a detailed symptom journal including foods eaten, contact with allergens,  medications taken, weather  changes.    Chronic rhinitis Begin an antihistamine once a day if needed for a runny nose or itch Begin Flonase 2 sprays in each nostril once a day if needed for a stuffy nose.  In the right nostril, point the applicator out toward the right ear. In the left nostril, point the applicator out toward the left ear Consider saline nasal rinses as needed for nasal symptoms. Use this before any medicated nasal sprays for best result Consider updating your environmental allergy testing.  Remember to stop antihistamines for 3 days before your testing appointment  Asthma/COPD Continue to follow-up with your pulmonology specialist as recommended  Tobacco use At down using tobacco products and best to quit  Call the clinic if this treatment plan is not working well for you.  Follow up in 6 months or sooner if needed. Meds ordered this encounter  Medications   EPINEPHrine  (EPIPEN  2-PAK) 0.3 mg/0.3 mL IJ SOAJ injection    Sig: Inject 0.3 mg into the muscle once for 1 dose.    Dispense:  1 each    Refill:  2    Return in about 6 months (around 10/31/2023), or if symptoms worsen or fail to improve.    Thank you for the opportunity to care for this patient.  Please do not hesitate to contact me with questions.  Marinus Sic, FNP [facility] 

## 2023-05-01 ENCOUNTER — Other Ambulatory Visit: Payer: Self-pay

## 2023-05-01 ENCOUNTER — Encounter: Payer: Self-pay | Admitting: Family Medicine

## 2023-05-01 ENCOUNTER — Ambulatory Visit: Admitting: Family Medicine

## 2023-05-01 VITALS — BP 128/80 | HR 89 | Temp 98.1°F | Resp 16 | Ht 64.17 in | Wt 145.9 lb

## 2023-05-01 DIAGNOSIS — L508 Other urticaria: Secondary | ICD-10-CM | POA: Diagnosis not present

## 2023-05-01 DIAGNOSIS — J310 Chronic rhinitis: Secondary | ICD-10-CM | POA: Diagnosis not present

## 2023-05-01 DIAGNOSIS — Z720 Tobacco use: Secondary | ICD-10-CM

## 2023-05-01 DIAGNOSIS — J4520 Mild intermittent asthma, uncomplicated: Secondary | ICD-10-CM

## 2023-05-01 MED ORDER — EPINEPHRINE 0.3 MG/0.3ML IJ SOAJ: 0.3000 mg | Freq: Once | INTRAMUSCULAR | 2 refills | Status: AC

## 2023-05-03 ENCOUNTER — Other Ambulatory Visit: Payer: Self-pay

## 2023-05-04 ENCOUNTER — Other Ambulatory Visit (HOSPITAL_COMMUNITY): Payer: Self-pay

## 2023-05-23 ENCOUNTER — Other Ambulatory Visit (HOSPITAL_COMMUNITY): Payer: Self-pay

## 2023-05-23 ENCOUNTER — Other Ambulatory Visit: Payer: Self-pay

## 2023-05-23 ENCOUNTER — Other Ambulatory Visit: Payer: Self-pay | Admitting: Internal Medicine

## 2023-05-23 NOTE — Progress Notes (Signed)
 Specialty Pharmacy Refill Coordination Note  Donna Gordon is a 43 y.o. female contacted today regarding refills of specialty medication(s) Omalizumab  (XOLAIR )   Patient requested Delivery   Delivery date: 06/01/23   Verified address: [address] [place] [address]   Medication will be filled on 05/31/23. This fill date is pending response to refill request from provider. Patient is aware and if they have not received fill by intended date they must follow up with pharmacy.

## 2023-05-23 NOTE — Telephone Encounter (Signed)
 Refill sent to me

## 2023-05-23 NOTE — Progress Notes (Signed)
 Clinical Intervention Note  Clinical Intervention Notes: Pt unable to give injection in the thigh but worried about forgeting last injection and injecting in the same places on the stomach. Counseled pt on rotating sites and documenting on calender where the injection was given last. Patient also questioned having to start new medications in the upcoming months anticipating results from thyroid biopsy and places on arm. Advised patient to call specialty pharmacy and have RPh check for drug interactions before starting new medications.   Clinical Intervention Outcomes: Improved therapy adherence; Prevention of an adverse drug event   Advertising account planner

## 2023-05-23 NOTE — Progress Notes (Signed)
 Specialty Pharmacy Ongoing Clinical Assessment Note  Donna Gordon is a 43 y.o. female who is being followed by the specialty pharmacy service for RxSp Allergy   Patient's specialty medication(s) reviewed today: Omalizumab  (XOLAIR )   Missed doses in the last 4 weeks: 0   Patient/Caregiver asked additional questions regarding forgetting last injection site. Advised pt to keep up with previous injection site on calendar.   Therapeutic benefit summary: Patient is achieving benefit   Adverse events/side effects summary: No adverse events/side effects   Patient's therapy is appropriate to: Continue    Goals Addressed             This Visit's Progress    Reduce signs and symptoms   On track    Patient is on track. Patient will maintain adherence         Follow up: 6 months  [facility] Specialty Pharmacist

## 2023-05-25 ENCOUNTER — Other Ambulatory Visit: Payer: Self-pay

## 2023-05-25 ENCOUNTER — Other Ambulatory Visit (HOSPITAL_COMMUNITY): Payer: Self-pay

## 2023-05-25 MED ORDER — OMALIZUMAB 150 MG/ML ~~LOC~~ SOSY
300.0000 mg | PREFILLED_SYRINGE | SUBCUTANEOUS | 11 refills | Status: AC
  Filled 2023-05-25: qty 2, 28d supply, fill #0
  Filled 2023-06-30: qty 2, 28d supply, fill #1
  Filled 2023-07-26: qty 2, 28d supply, fill #2
  Filled 2023-08-22: qty 2, 28d supply, fill #3
  Filled 2023-09-25: qty 2, 28d supply, fill #4
  Filled 2023-10-25: qty 2, 28d supply, fill #5
  Filled 2023-11-28: qty 2, 28d supply, fill #6
  Filled 2023-12-20: qty 2, 28d supply, fill #7
  Filled 2024-01-17: qty 2, 28d supply, fill #8

## 2023-05-31 ENCOUNTER — Other Ambulatory Visit: Payer: Self-pay

## 2023-06-28 ENCOUNTER — Other Ambulatory Visit: Payer: Self-pay

## 2023-06-30 ENCOUNTER — Other Ambulatory Visit: Payer: Self-pay

## 2023-06-30 ENCOUNTER — Other Ambulatory Visit: Payer: Self-pay | Admitting: Pharmacy Technician

## 2023-06-30 NOTE — Progress Notes (Signed)
 Specialty Pharmacy Refill Coordination Note  Donna Gordon is a 43 y.o. female contacted today regarding refills of specialty medication(s) Omalizumab  (XOLAIR )   Patient requested Delivery   Delivery date: 07/04/23   Verified address: [address] [place]   Medication will be filled on 07/03/23.

## 2023-07-03 ENCOUNTER — Other Ambulatory Visit: Payer: Self-pay

## 2023-07-25 ENCOUNTER — Encounter (INDEPENDENT_AMBULATORY_CARE_PROVIDER_SITE_OTHER): Payer: Self-pay

## 2023-07-25 ENCOUNTER — Other Ambulatory Visit (HOSPITAL_COMMUNITY): Payer: Self-pay

## 2023-07-26 ENCOUNTER — Other Ambulatory Visit (HOSPITAL_COMMUNITY): Payer: Self-pay

## 2023-07-26 ENCOUNTER — Other Ambulatory Visit: Payer: Self-pay

## 2023-07-26 NOTE — Progress Notes (Signed)
 Specialty Pharmacy Refill Coordination Note  MyChart Questionnaire Submission  Donna Gordon is a 43 y.o. female contacted today regarding refills of specialty medication(s) Xolair .  Injection date: (Patient-Rptd) 08/07/23   Patient requested: (Patient-Rptd) Delivery   Delivery date: 07/28/23   Verified address: (Patient-Rptd) [address] [place]. [address]  Medication will be filled on 07/27/23.

## 2023-08-17 ENCOUNTER — Other Ambulatory Visit: Payer: Self-pay

## 2023-08-22 ENCOUNTER — Other Ambulatory Visit: Payer: Self-pay

## 2023-08-22 ENCOUNTER — Other Ambulatory Visit: Payer: Self-pay | Admitting: Pharmacy Technician

## 2023-08-22 NOTE — Progress Notes (Signed)
 Specialty Pharmacy Refill Coordination Note  Donna Gordon is a 43 y.o. female contacted today regarding refills of specialty medication(s) Omalizumab  (XOLAIR )   Patient requested Delivery   Delivery date: 08/31/23   Verified address: [address]  [place]   Medication will be filled on 08/30/23.

## 2023-08-30 ENCOUNTER — Other Ambulatory Visit: Payer: Self-pay

## 2023-09-20 ENCOUNTER — Other Ambulatory Visit (HOSPITAL_COMMUNITY): Payer: Self-pay

## 2023-09-25 ENCOUNTER — Encounter (INDEPENDENT_AMBULATORY_CARE_PROVIDER_SITE_OTHER): Payer: Self-pay

## 2023-09-25 ENCOUNTER — Other Ambulatory Visit: Payer: Self-pay

## 2023-09-25 ENCOUNTER — Other Ambulatory Visit: Payer: Self-pay | Admitting: Pharmacy Technician

## 2023-09-25 NOTE — Progress Notes (Signed)
 Specialty Pharmacy Refill Coordination Note  Donna Gordon is a 43 y.o. female contacted today regarding refills of specialty medication(s) Omalizumab  (XOLAIR )   Patient requested Delivery   Delivery date: 10/03/23   Verified address: [address] [place] [address]   Medication will be filled on 10/02/23. Injection due on : 10/08/23 Questionnaire answered

## 2023-10-02 ENCOUNTER — Other Ambulatory Visit: Payer: Self-pay

## 2023-10-25 ENCOUNTER — Other Ambulatory Visit: Payer: Self-pay

## 2023-10-25 ENCOUNTER — Other Ambulatory Visit (HOSPITAL_COMMUNITY): Payer: Self-pay

## 2023-10-25 NOTE — Progress Notes (Signed)
 Specialty Pharmacy Refill Coordination Note  Donna Gordon is a 43 y.o. female contacted today regarding refills of specialty medication(s) Omalizumab  (XOLAIR )   Patient requested Delivery   Delivery date: 10/31/23   Verified address: [address] [place] [address]   Medication will be filled on 10/30/23.

## 2023-10-30 ENCOUNTER — Encounter: Payer: Self-pay | Admitting: Internal Medicine

## 2023-10-30 ENCOUNTER — Other Ambulatory Visit: Payer: Self-pay

## 2023-10-30 ENCOUNTER — Ambulatory Visit (INDEPENDENT_AMBULATORY_CARE_PROVIDER_SITE_OTHER): Admitting: Internal Medicine

## 2023-10-30 VITALS — BP 130/90 | HR 94 | Temp 98.3°F | Ht 65.0 in | Wt 149.7 lb

## 2023-10-30 DIAGNOSIS — J310 Chronic rhinitis: Secondary | ICD-10-CM | POA: Insufficient documentation

## 2023-10-30 DIAGNOSIS — L501 Idiopathic urticaria: Secondary | ICD-10-CM | POA: Diagnosis not present

## 2023-10-30 DIAGNOSIS — J4520 Mild intermittent asthma, uncomplicated: Secondary | ICD-10-CM | POA: Diagnosis not present

## 2023-10-30 NOTE — Patient Instructions (Addendum)
 Hives (urticaria)-at goal on Xolair  Take the least amount of medications while remaining high free Cetirizine (Zyrtec) 10mg  twice a day and famotidine (Pepcid) 20 mg twice a day. If no symptoms for 7-14 days then decrease to. Cetirizine (Zyrtec) 10mg  twice a day and famotidine (Pepcid) 20 mg once a day.  If no symptoms for 7-14 days then decrease to. Cetirizine (Zyrtec) 10mg  twice a day.  If no symptoms for 7-14 days then decrease to. Cetirizine (Zyrtec) 10mg  once a day.  May use Benadryl  (diphenhydramine ) as needed for breakthrough hives       If symptoms return, then step up dosage Continue Xolair  injections 300 mg once every 4 weeks epinephrine  autoinjector set per protocol   Chronic rhinitis Continue antihistamine once a day if needed for a runny nose or itch Continue Flonase 2 sprays in each nostril once a day if needed for a stuffy nose.  In the right nostril, point the applicator out toward the right ear. In the left nostril, point the applicator out toward the left ear Consider saline nasal rinses as needed for nasal symptoms. Use this before any medicated nasal sprays for best result If you ever want updated allergy testing, can discuss holding xolair .  Asthma/COPD Continue to follow-up with your pulmonology specialist as recommended  Tobacco use Congrats on quitting!  Call the clinic if this treatment plan is not working well for you.  Follow up in 6 months or sooner if needed.

## 2023-10-30 NOTE — Progress Notes (Signed)
 FOLLOW UP Date of Service/Encounter:   10/30/2023  Subjective:  Donna Gordon (DOB: 10/31/1980) is a 43 y.o. female who [facility] in re-evaluation of the following: hives on Xolair  History obtained from: chart review and patient.  For Review, LV was on 05/01/23  with Donna Mutter, FNP seen for routine follow-up. See below for summary of history and diagnostics.   Therapeutic plans/changes recommended: doing well on Xolair  300 mg every 4 weeks; asthma managed by pulmonary and doing well, poorly controlled rhinitis; instructed to start daily AH, INCS, saline spray PRN and update environmental allergy testing ----------------------------------------------------- Pertinent History/Diagnostics:  Asthma/COPD Diagnosed in childhood. Never hospitalized. No steroids for asthma in past year or ED visits. She has a nebulizer with albuterol  that she has not used in 2 years ago. - FEV1 93%  Urticaria:  hives x 2 years, worsening x 6 months. Associates burning but not itching. Has eyelid swelling and lip swelling. + fatigue Labs: HbA1C normal, ESR normal, normal TSH, slightly decreased T3 uptake with normal T4, normal B12 and vitamin D, normal CBCd with AEC 100, CMP normal, iron  studies normal.  --------------------------------------------------- Today presents for follow-up. Discussed the use of AI scribe software for clinical note transcription with the patient, who gave verbal consent to proceed.  History of Present Illness Donna Gordon is a 43 year old female with asthma, COPD, and chronic hives who presents for follow-up on Xolair  treatment.  Chronic urticaria - Chronic hives recur if Xolair  injection is delayed by 2-3 days past scheduled dosing. - No current use of antihistamines; relies solely on Xolair  for management. - Initial nausea with Xolair  self-injection, now tolerated without difficulty.  Asthma and exercise intolerance -  Asthma symptoms fluctuate with 'good days and bad days.' - Increased use of rescue inhaler during physical activity, such as playing outside with her daughter. - No recent need for oral corticosteroids or emergency department visits.  Chronic obstructive pulmonary disease (copd) - COPD managed by pulmonary - No recent exacerbations requiring escalation of care.  Nasal symptoms - Previously reported nasal symptoms have improved. - No current use of medication for nasal symptoms.  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 - Not currently on thyroid hormone supplementation. - Ongoing monitoring of remaining thyroid tissue to determine need for future supplementation.  Smoking cessation - Successfully stopped smoking. - Ongoing challenges with cravings, especially when exposed to others smoking, such as her best friend. - Describes variable success with cessation, with 'good days and bad days.'   All medications reviewed by clinical staff and updated in chart. No new pertinent medical or surgical history except as noted in HPI.  ROS: All others negative except as noted per HPI.   Objective:  BP (!) 130/90 (BP Location: Left Arm, Patient Position: Sitting, Cuff Size: Normal)   Pulse 94   Temp 98.3 F (36.8 C) (Temporal)   Ht 5' 5 (1.651 m)   Wt 149 lb 11.2 oz (67.9 kg)   LMP 12/18/2020 (Approximate)   SpO2 99%   BMI 24.91 kg/m  Body mass index is 24.91 kg/m. Physical Exam: General Appearance:  Alert, cooperative, no distress, appears stated age  Head:  Normocephalic, without obvious abnormality, atraumatic  Eyes:  Conjunctiva clear, EOM's intact  Ears EACs normal bilaterally and normal TMs bilaterally  Nose: Nares normal, hypertrophic turbinates, normal mucosa, and no visible anterior polyps  Throat: Lips, tongue normal; teeth and gums normal, normal  posterior oropharynx  Neck: Supple, symmetrical  Lungs:   clear to  auscultation bilaterally, Respirations unlabored, no coughing  Heart:  regular rate and rhythm and no murmur, Appears well perfused  Extremities: No edema  Skin: Skin color, texture, turgor normal and no rashes or lesions on visualized portions of skin  Neurologic: No gross deficits   Labs:  Lab Orders  No laboratory test(s) ordered today    Assessment/Plan   Hives (urticaria)-at goal on Xolair  Take the least amount of medications while remaining high free Cetirizine (Zyrtec) 10mg  twice a day and famotidine (Pepcid) 20 mg twice a day. If no symptoms for 7-14 days then decrease to. Cetirizine (Zyrtec) 10mg  twice a day and famotidine (Pepcid) 20 mg once a day.  If no symptoms for 7-14 days then decrease to. Cetirizine (Zyrtec) 10mg  twice a day.  If no symptoms for 7-14 days then decrease to. Cetirizine (Zyrtec) 10mg  once a day.  May use Benadryl  (diphenhydramine ) as needed for breakthrough hives       If symptoms return, then step up dosage Continue Xolair  injections 300 mg once every 4 weeks epinephrine  autoinjector set per protocol   Chronic rhinitis-at goal Continue antihistamine once a day if needed for a runny nose or itch Continue Flonase 2 sprays in each nostril once a day if needed for a stuffy nose.  In the right nostril, point the applicator out toward the right ear. In the left nostril, point the applicator out toward the left ear Consider saline nasal rinses as needed for nasal symptoms. Use this before any medicated nasal sprays for best result If you ever want updated allergy testing, can discuss holding xolair .  Asthma/COPD-stable Continue to follow-up with your pulmonology specialist as recommended  Tobacco use Congrats on quitting!  Call the clinic if this treatment plan is not working well for you.  Follow up in 6 months or sooner if needed.  Other: none  Rocky Endow, MD  [facility] 

## 2023-11-20 ENCOUNTER — Other Ambulatory Visit: Payer: Self-pay

## 2023-11-28 ENCOUNTER — Other Ambulatory Visit (HOSPITAL_COMMUNITY): Payer: Self-pay

## 2023-11-28 ENCOUNTER — Other Ambulatory Visit: Payer: Self-pay

## 2023-11-30 ENCOUNTER — Other Ambulatory Visit: Payer: Self-pay

## 2023-11-30 ENCOUNTER — Other Ambulatory Visit: Payer: Self-pay | Admitting: Pharmacy Technician

## 2023-11-30 NOTE — Progress Notes (Signed)
 Specialty Pharmacy Refill Coordination Note  Donna Gordon is a 43 y.o. female contacted today regarding refills of specialty medication(s) Omalizumab  (XOLAIR )   Patient requested Delivery   Delivery date: 12/01/23   Verified address: [address] [place]   Medication will be filled on: 11/30/23

## 2023-12-20 ENCOUNTER — Other Ambulatory Visit: Payer: Self-pay

## 2023-12-20 ENCOUNTER — Other Ambulatory Visit (HOSPITAL_COMMUNITY): Payer: Self-pay

## 2023-12-22 ENCOUNTER — Other Ambulatory Visit: Payer: Self-pay

## 2023-12-22 ENCOUNTER — Other Ambulatory Visit: Payer: Self-pay | Admitting: Pharmacy Technician

## 2023-12-22 NOTE — Progress Notes (Signed)
 Specialty Pharmacy Refill Coordination Note  Donna Gordon is a 43 y.o. female contacted today regarding refills of specialty medication(s) Omalizumab  (XOLAIR )   Patient requested Delivery   Delivery date: 12/28/23   Verified address: [address]  [place]    Medication will be filled on: 12/27/23

## 2023-12-27 ENCOUNTER — Other Ambulatory Visit: Payer: Self-pay

## 2024-01-17 ENCOUNTER — Other Ambulatory Visit (HOSPITAL_COMMUNITY): Payer: Self-pay

## 2024-01-17 ENCOUNTER — Other Ambulatory Visit: Payer: Self-pay

## 2024-01-17 ENCOUNTER — Other Ambulatory Visit: Payer: Self-pay | Admitting: Pharmacy Technician

## 2024-01-17 NOTE — Progress Notes (Signed)
 Specialty Pharmacy Refill Coordination Note  Donna Gordon is a 44 y.o. female contacted today regarding refills of specialty medication(s) Omalizumab  (XOLAIR )   Patient requested Delivery   Delivery date: 01/31/24   Verified address: [address] [place] [address]   Medication will be filled on: 01/30/24

## 2024-01-30 ENCOUNTER — Other Ambulatory Visit: Payer: Self-pay

## 2024-02-06 ENCOUNTER — Ambulatory Visit (HOSPITAL_COMMUNITY)
Admission: RE | Admit: 2024-02-06 | Discharge: 2024-02-06 | Disposition: A | Discharge status: Home / Self Care | Source: Ambulatory Visit | Attending: Urgent Care | Admitting: Urgent Care

## 2024-02-06 ENCOUNTER — Ambulatory Visit: Payer: Self-pay | Admitting: Urgent Care

## 2024-02-06 ENCOUNTER — Ambulatory Visit
Admission: EM | Admit: 2024-02-06 | Discharge: 2024-02-06 | Disposition: A | Discharge status: Home / Self Care | Attending: Family Medicine | Admitting: Family Medicine

## 2024-02-06 DIAGNOSIS — S60032A Contusion of left middle finger without damage to nail, initial encounter: Secondary | ICD-10-CM | POA: Diagnosis not present

## 2024-02-06 DIAGNOSIS — M79645 Pain in left finger(s): Secondary | ICD-10-CM | POA: Diagnosis not present

## 2024-02-06 MED ORDER — IBUPROFEN 800 MG PO TABS: 800.0000 mg | ORAL_TABLET | Freq: Three times a day (TID) | ORAL | 0 refills | Status: AC

## 2024-02-06 NOTE — ED Provider Notes (Signed)
 " Producer, Television/film/video - [facility]  Note:  This document was prepared using Conservation officer, historic buildings and may include unintentional dictation errors.  MRN: [id number] DOB: 17-Dec-1980  Subjective:   Donna Gordon is a 44 y.o. female presenting for 1 day history of left middle finger pain from an injury while sledding. Reports that she felt an impact and then had subsequent pain with worsening swelling. No bruising, bony deformity.   Current Outpatient Medications  Medication Instructions   ADVAIR HFA 230-21 MCG/ACT inhaler 2 puffs, 2 times daily   albuterol  (PROVENTIL  HFA;VENTOLIN  HFA) 108 (90 Base) MCG/ACT inhaler 1 puff, Every 6 hours PRN   azithromycin  (ZITHROMAX ) 250 mg, Oral, Daily, Take first 2 tablets together, then 1 every day until finished.   benzonatate  (TESSALON ) 200 mg, Oral, 3 times daily PRN   cetirizine (ZYRTEC) 10 mg, Daily   EPINEPHrine  (EPI-PEN) 0.3 mg, As needed   ipratropium (ATROVENT ) 0.03 % nasal spray 2 sprays, Each Nare, Every 12 hours   Xolair  300 mg, Subcutaneous, Every 28 days    Allergies[1]  Past Medical History:  Diagnosis Date   Asthma    prn inhaler   Chest pain 11/25/2020   with pvc's worked up with holter monitor 11-09-2020  by [facility] echo stress 12-22-2020   Depression    Family history of adverse reaction to anesthesia    maternal grandmother has hx. of being hard to wake up post-op   Migraines    Numbness    last few months being worked up for ms has head mri friday 12-25-2020, numbness right side arm foot and toes several x day   Sciatic nerve pain    right lower back   Skin abnormality    gets whelps all over body for last year none currently pcp to sent pt to dermatology   Uterine polyp 08/2017     Past Surgical History:  Procedure Laterality Date   CESAREAN SECTION N/A 12/02/2015   Procedure: CESAREAN SECTION;  Surgeon: Suzen Maryan Masters, MD;  Location: [facility];  Service: Obstetrics;   Laterality: N/A;   CHOLECYSTECTOMY, LAPAROSCOPIC  10/07/2014   DILATATION & CURETTAGE/HYSTEROSCOPY WITH MYOSURE N/A 09/06/2017   Procedure: DILATATION & CURETTAGE/HYSTEROSCOPY;  Surgeon: Eveline Lynwood MATSU, MD;  Location: [facility];  Service: Gynecology;  Laterality: N/A;   MYOMECTOMY ABDOMINAL APPROACH  08/06/2007   VAGINAL HYSTERECTOMY Bilateral 12/29/2020   Procedure: HYSTERECTOMY VAGINAL WITH LEFT SALPINGECTOMY;  Surgeon: Lorence Ozell CROME, MD;  Location: [facility];  Service: Gynecology;  Laterality: Bilateral;    Family History  Problem Relation Age of Onset   Lupus Mother    Thyroid cancer Mother    Lupus Sister    Emphysema Maternal Aunt    COPD Maternal Aunt    Cancer Paternal Aunt 37       pancreatic    Cancer Maternal Grandmother 59       breast   Anesthesia problems Maternal Grandmother        hard to wake up post-op   Asthma Paternal Grandmother    COPD Paternal Grandmother    Emphysema Paternal Grandmother    Eczema Daughter    Asthma Daughter    Allergic rhinitis Neg Hx    Atopy Neg Hx    Urticaria Neg Hx    Angioedema Neg Hx     Social History   Occupational History   Not on file  Tobacco Use   Smoking status: Former  Current packs/day: 0.25    Average packs/day: 0.3 packs/day for 21.0 years (5.3 ttl pk-yrs)    Types: Cigarettes    Passive exposure: Never   Smokeless tobacco: Never  Vaping Use   Vaping status: Never Used  Substance and Sexual Activity   Alcohol use: No   Drug use: No   Sexual activity: Not on file     ROS   Objective:   Vitals: BP (!) 158/95 (BP Location: Right Arm)   Temp 98 F (36.7 C) (Oral)   Resp 20   LMP 12/18/2020   SpO2 99%   Physical Exam Constitutional:      General: She is not in acute distress.    Appearance: Normal appearance. She is well-developed. She is not ill-appearing, toxic-appearing or diaphoretic.  HENT:     Head: Normocephalic and atraumatic.     Nose: Nose  normal.     Mouth/Throat:     Mouth: Mucous membranes are moist.  Eyes:     General: No scleral icterus.       Right eye: No discharge.        Left eye: No discharge.     Extraocular Movements: Extraocular movements intact.  Cardiovascular:     Rate and Rhythm: Normal rate.  Pulmonary:     Effort: Pulmonary effort is normal.  Musculoskeletal:       Hands:     Comments: Tenderness over the middle and proximal phalanx with trace swelling.  Near full range of motion.  Skin:    General: Skin is warm and dry.  Neurological:     General: No focal deficit present.     Mental Status: She is alert and oriented to person, place, and time.  Psychiatric:        Mood and Affect: Mood normal.        Behavior: Behavior normal.    Buddy tape system applied.  Assessment and Plan :   PDMP not reviewed this encounter.  1. Contusion of left middle finger without damage to nail, initial encounter   2. Finger pain, left      Will pursue outpatient x-ray.  Recommended conservative management for left middle finger contusion.  Use RICE method, ibuprofen  for pain and inflammation. Counseled patient on potential for adverse effects with medications prescribed/recommended today, ER and return-to-clinic precautions discussed, patient verbalized understanding.     [1]  Allergies Allergen Reactions   Dilaudid  [Hydromorphone  Hcl] Itching   Other Hives    SWEET AND SOUR MITTIE Christopher [place], [place] 02/06/24 1113  "

## 2024-02-06 NOTE — Discharge Instructions (Addendum)
 Go straight to our partner urgent care at [facility] for your x-ray. Use ibuprofen  for pain and inflammation. Use buddy tape system for the next 1-2 weeks. Will follow up with your x-ray results later today.

## 2024-02-06 NOTE — ED Triage Notes (Signed)
 Pt states she injured left middle finger sledding yesterday-NAD-steady gait

## 2024-04-29 ENCOUNTER — Ambulatory Visit: Admitting: Internal Medicine
